# Patient Record
Sex: Male | Born: 1945
Health system: Southern US, Community
[De-identification: ages and names within clinical notes are randomized; demographics above are authoritative.]

## PROBLEM LIST (undated history)

## (undated) DIAGNOSIS — I10 Essential (primary) hypertension: Secondary | ICD-10-CM

## (undated) DIAGNOSIS — K219 Gastro-esophageal reflux disease without esophagitis: Secondary | ICD-10-CM

## (undated) DIAGNOSIS — J309 Allergic rhinitis, unspecified: Secondary | ICD-10-CM

## (undated) DIAGNOSIS — J45909 Unspecified asthma, uncomplicated: Secondary | ICD-10-CM

## (undated) DIAGNOSIS — N400 Enlarged prostate without lower urinary tract symptoms: Secondary | ICD-10-CM

## (undated) DIAGNOSIS — F329 Major depressive disorder, single episode, unspecified: Secondary | ICD-10-CM

## (undated) DIAGNOSIS — F32A Depression, unspecified: Secondary | ICD-10-CM

## (undated) DIAGNOSIS — G43909 Migraine, unspecified, not intractable, without status migrainosus: Secondary | ICD-10-CM

## (undated) DIAGNOSIS — E785 Hyperlipidemia, unspecified: Secondary | ICD-10-CM

## (undated) DIAGNOSIS — T7840XA Allergy, unspecified, initial encounter: Secondary | ICD-10-CM

## (undated) HISTORY — PX: KNEE ARTHROSCOPY: SUR90

## (undated) HISTORY — DX: Major depressive disorder, single episode, unspecified: F32.9

## (undated) HISTORY — DX: Hyperlipidemia, unspecified: E78.5

## (undated) HISTORY — DX: Allergic rhinitis, unspecified: J30.9

## (undated) HISTORY — DX: Depression, unspecified: F32.A

## (undated) HISTORY — DX: Essential (primary) hypertension: I10

## (undated) HISTORY — DX: Allergy, unspecified, initial encounter: T78.40XA

## (undated) HISTORY — DX: Migraine, unspecified, not intractable, without status migrainosus: G43.909

## (undated) HISTORY — DX: Benign prostatic hyperplasia without lower urinary tract symptoms: N40.0

## (undated) HISTORY — DX: Gastro-esophageal reflux disease without esophagitis: K21.9

---

## 2004-08-15 ENCOUNTER — Ambulatory Visit: Payer: Self-pay | Admitting: Internal Medicine

## 2004-08-21 ENCOUNTER — Ambulatory Visit (HOSPITAL_COMMUNITY): Admission: RE | Admit: 2004-08-21 | Discharge: 2004-08-21 | Payer: Self-pay | Admitting: Family Medicine

## 2004-08-23 ENCOUNTER — Ambulatory Visit (HOSPITAL_COMMUNITY): Admission: RE | Admit: 2004-08-23 | Discharge: 2004-08-23 | Payer: Self-pay | Admitting: Internal Medicine

## 2004-08-23 ENCOUNTER — Ambulatory Visit: Payer: Self-pay | Admitting: Internal Medicine

## 2012-05-07 ENCOUNTER — Telehealth: Payer: Self-pay | Admitting: Family Medicine

## 2012-05-07 NOTE — Telephone Encounter (Signed)
Told to go to urgent care- if needed urgently- or if really sob.- lm on daughters vm- she did not answer.

## 2012-05-08 ENCOUNTER — Ambulatory Visit (INDEPENDENT_AMBULATORY_CARE_PROVIDER_SITE_OTHER): Payer: Medicare Other

## 2012-05-08 ENCOUNTER — Encounter: Payer: Self-pay | Admitting: Family Medicine

## 2012-05-08 ENCOUNTER — Ambulatory Visit (INDEPENDENT_AMBULATORY_CARE_PROVIDER_SITE_OTHER): Payer: Medicare Other | Admitting: Family Medicine

## 2012-05-08 VITALS — BP 152/96 | HR 95 | Temp 97.2°F | Ht 67.5 in | Wt 166.0 lb

## 2012-05-08 DIAGNOSIS — K219 Gastro-esophageal reflux disease without esophagitis: Secondary | ICD-10-CM

## 2012-05-08 DIAGNOSIS — R0989 Other specified symptoms and signs involving the circulatory and respiratory systems: Secondary | ICD-10-CM

## 2012-05-08 DIAGNOSIS — F3289 Other specified depressive episodes: Secondary | ICD-10-CM

## 2012-05-08 DIAGNOSIS — R059 Cough, unspecified: Secondary | ICD-10-CM

## 2012-05-08 DIAGNOSIS — F32A Depression, unspecified: Secondary | ICD-10-CM | POA: Insufficient documentation

## 2012-05-08 DIAGNOSIS — R05 Cough: Secondary | ICD-10-CM

## 2012-05-08 DIAGNOSIS — R0602 Shortness of breath: Secondary | ICD-10-CM

## 2012-05-08 DIAGNOSIS — J45909 Unspecified asthma, uncomplicated: Secondary | ICD-10-CM

## 2012-05-08 DIAGNOSIS — F329 Major depressive disorder, single episode, unspecified: Secondary | ICD-10-CM

## 2012-05-08 LAB — POCT CBC
Granulocyte percent: 69.1 %G (ref 37–80)
Hemoglobin: 17 g/dL (ref 14.1–18.1)
Lymph, poc: 3 (ref 0.6–3.4)
MCHC: 34.5 g/dL (ref 31.8–35.4)
MPV: 8.2 fL (ref 0–99.8)
POC Granulocyte: 8.4 — AB (ref 2–6.9)

## 2012-05-08 MED ORDER — BUDESONIDE 0.5 MG/2ML IN SUSP: 0.2500 mg | Freq: Once | RESPIRATORY_TRACT | Status: DC

## 2012-05-08 MED ORDER — ALBUTEROL SULFATE (5 MG/ML) 0.5% IN NEBU: 2.5000 mg | INHALATION_SOLUTION | Freq: Four times a day (QID) | RESPIRATORY_TRACT | Status: DC | PRN

## 2012-05-08 NOTE — Progress Notes (Addendum)
Subjective:    Patient ID: Blake Madden, male    DOB: Jul 16, 1945, 67 y.o.   MRN: [id number]  HPI Mr. Bostrom presents today with cough and chest congestion.   Review of Systems  Constitutional: Positive for chills, diaphoresis and activity change (decreased due to SOB). Negative for fever.  HENT: Positive for congestion and sneezing.   Respiratory: Positive for cough (prod. yellow), chest tightness, shortness of breath and wheezing.        Several episodes of breathing shortness of breath and wheezing. The first episode was in November, he got some better from this. He has had continuous trouble since the end of January 2014. Sputum is between white and yellow in color. The only inhaler that he is using  Is proair  Cardiovascular: Negative for chest pain.  Gastrointestinal: Negative.   Genitourinary: Negative.   Musculoskeletal: Negative.   Neurological: Positive for headaches. Negative for dizziness and light-headedness.       Objective:   Physical Exam  Nursing note and vitals reviewed. Constitutional: He appears well-developed and well-nourished. He appears distressed.  HENT:  Head: Normocephalic and atraumatic.  Right Ear: External ear normal.  Left Ear: External ear normal.  Mouth/Throat: Oropharynx is clear and moist.  Slight nasal congestion bilateral  Eyes: Conjunctivae are normal. Right eye exhibits discharge. Left eye exhibits no discharge.  Neck: Normal range of motion. Neck supple. Thyromegaly present.  Cardiovascular: Normal rate, regular rhythm and normal heart sounds.  Exam reveals no gallop and no friction rub.   No murmur heard. Pulmonary/Chest: He is in respiratory distress. He has wheezes. He has no rales. He exhibits no tenderness.  Abdominal: Soft. He exhibits no mass. There is no tenderness. There is no rebound and no guarding.  Does have an umbilical hernia  Lymphadenopathy:    He has no cervical adenopathy.   [facility] reading (PRIMARY) by  Dr. Vernon Prey:  Prominent bronchial markings bilaterally                               Results for orders placed in visit on 05/08/12  POCT CBC      Result Value Range   WBC 12.1 (*) 4.6 - 10.2 K/uL   Lymph, poc 3.0  0.6 - 3.4   POC LYMPH PERCENT 24.5  10 - 50 %L   MID (cbc)    0 - 0.9   POC MID %    0 - 12 %M   POC Granulocyte 8.4 (*) 2 - 6.9   Granulocyte percent 69.1  37 - 80 %G   RBC 5.4  4.69 - 6.13 M/uL   Hemoglobin 17.0  14.1 - 18.1 g/dL   HCT, POC 14.7  82.9 - 53.7 %   MCV 91.0  80 - 97 fL   MCH, POC 31.4 (*) 27 - 31.2 pg   MCHC 34.5  31.8 - 35.4 g/dL   RDW, POC 56.2     Platelet Count, POC 359.0  142 - 424 K/uL   MPV 8.2  0 - 99.8 fL           Assessment & Plan:  1. Cough - POCT CBC - DG Chest 2 View; Future - budesonide (PULMICORT) nebulizer solution 0.25 mg; Take 1 mL (0.25 mg total) by nebulization once. - albuterol (PROVENTIL) (2.5 MG/3ML) 0.083% nebulizer solution 2.5 mg; Take 3 mLs (2.5 mg total) by nebulization now.  2. Chest congestion - POCT CBC -  DG Chest 2 View; Future - budesonide (PULMICORT) nebulizer solution 0.25 mg; Take 1 mL (0.25 mg total) by nebulization once. - albuterol (PROVENTIL) (2.5 MG/3ML) 0.083% nebulizer solution 2.5 mg; Take 3 mLs (2.5 mg total) by nebulization now.  3. SOB (shortness of breath) - POCT CBC - DG Chest 2 View; Future - budesonide (PULMICORT) nebulizer solution 0.25 mg; Take 1 mL (0.25 mg total) by nebulization once. - albuterol (PROVENTIL) (2.5 MG/3ML) 0.083% nebulizer solution 2.5 mg; Take 3 mLs (2.5 mg total) by nebulization now.  4. GERD (gastroesophageal reflux disease)  5. Depression  6. Asthmatic bronchitis - budesonide (PULMICORT) nebulizer solution 0.25 mg; Take 1 mL (0.25 mg total) by nebulization once. - For home use only DME Nebulizer machine - albuterol (PROVENTIL) (2.5 MG/3ML) 0.083% nebulizer solution 2.5 mg; Take 3 mLs (2.5 mg total) by nebulization now.

## 2012-05-08 NOTE — Patient Instructions (Addendum)
Pt will take meds as RX'd along with Mucinex Max twice daily with a large glass of water Will return to clinic in 1 week, if no better will refer to pulmonary

## 2012-05-11 MED ORDER — ALBUTEROL SULFATE (2.5 MG/3ML) 0.083% IN NEBU: 2.5000 mg | INHALATION_SOLUTION | RESPIRATORY_TRACT | Status: AC

## 2012-05-11 NOTE — Telephone Encounter (Signed)
Still no response from patient. May call back for an appt if still needed

## 2012-05-11 NOTE — Addendum Note (Signed)
Addended by: Ardine Eng A on: 05/11/2012 05:27 PM   Modules accepted: Orders

## 2012-05-13 ENCOUNTER — Ambulatory Visit (INDEPENDENT_AMBULATORY_CARE_PROVIDER_SITE_OTHER): Payer: Medicare Other | Admitting: Family Medicine

## 2012-05-13 ENCOUNTER — Encounter: Payer: Self-pay | Admitting: Family Medicine

## 2012-05-13 VITALS — BP 122/81 | HR 92 | Temp 98.3°F | Ht 67.5 in | Wt 168.2 lb

## 2012-05-13 DIAGNOSIS — J9801 Acute bronchospasm: Secondary | ICD-10-CM

## 2012-05-13 DIAGNOSIS — J209 Acute bronchitis, unspecified: Secondary | ICD-10-CM

## 2012-05-13 NOTE — Progress Notes (Signed)
  Subjective:    Patient ID: Blake Madden, male    DOB: 04/09/1945, 67 y.o.   MRN: [id number]  HPI Breathing better. Still some congestion and coughing. Sputum has a yellowish tint. No fever.   Review of Systems  HENT: Positive for congestion.   Respiratory: Positive for cough (resolving), shortness of breath and wheezing.        Objective:   Physical Exam  Vitals reviewed. Constitutional: He is oriented to person, place, and time. He appears well-developed and well-nourished. No distress.  HENT:  Head: Normocephalic and atraumatic.  Right Ear: External ear normal.  Left Ear: External ear normal.  Nose: Nose normal.  Mouth/Throat: Oropharynx is clear and moist. No oropharyngeal exudate.  Eyes: Conjunctivae are normal.  Neck: Normal range of motion. Neck supple. No thyromegaly present.  Cardiovascular: Normal rate, regular rhythm and normal heart sounds.   No murmur heard. Pulmonary/Chest: Effort normal. No respiratory distress. He has wheezes (wheezes with cough, sputum is still yellow tinged). He has no rales.  Musculoskeletal: Normal range of motion.  Lymphadenopathy:    He has no cervical adenopathy.  Neurological: He is alert and oriented to person, place, and time.  Skin: Skin is warm and dry.  Psychiatric: He has a normal mood and affect. His behavior is normal.          Assessment & Plan:  Acute bronchitis  Bronchospasm  Patient Instructions  Continue neb treatments for maybe 3 more weeks Return to clinic in 2weeks Continue fluid intake Continue Mucinex Finish prednisone and Z-Pak Avoid irritant environment

## 2012-05-13 NOTE — Patient Instructions (Signed)
Continue neb treatments for maybe 3 more weeks Return to clinic in 2weeks Continue fluid intake Continue Mucinex Finish prednisone and Z-Pak Avoid irritant environment

## 2012-05-19 MED ORDER — BUDESONIDE 0.25 MG/2ML IN SUSP
0.2500 mg | Freq: Once | RESPIRATORY_TRACT | Status: AC
  Administered 2012-05-08: 0.25 mg via RESPIRATORY_TRACT

## 2012-05-19 MED ORDER — METHYLPREDNISOLONE ACETATE 80 MG/ML IJ SUSP
60.0000 mg | Freq: Once | INTRAMUSCULAR | Status: AC
  Administered 2012-05-19: 60 mg via INTRAMUSCULAR

## 2012-05-19 MED ORDER — ALBUTEROL SULFATE (2.5 MG/3ML) 0.083% IN NEBU
2.5000 mg | INHALATION_SOLUTION | Freq: Once | RESPIRATORY_TRACT | Status: AC
  Administered 2012-05-08: 2.5 mg via RESPIRATORY_TRACT

## 2012-05-19 NOTE — Addendum Note (Signed)
Addended by: Ardine Eng A on: 05/19/2012 12:29 PM   Modules accepted: Orders

## 2012-05-27 ENCOUNTER — Encounter: Payer: Self-pay | Admitting: Family Medicine

## 2012-05-27 ENCOUNTER — Ambulatory Visit (INDEPENDENT_AMBULATORY_CARE_PROVIDER_SITE_OTHER): Payer: Medicare Other | Admitting: Family Medicine

## 2012-05-27 VITALS — BP 105/69 | HR 105 | Temp 98.0°F | Ht 67.5 in | Wt 171.2 lb

## 2012-05-27 DIAGNOSIS — R05 Cough: Secondary | ICD-10-CM

## 2012-05-27 DIAGNOSIS — J209 Acute bronchitis, unspecified: Secondary | ICD-10-CM

## 2012-05-27 DIAGNOSIS — J9801 Acute bronchospasm: Secondary | ICD-10-CM

## 2012-05-27 DIAGNOSIS — R059 Cough, unspecified: Secondary | ICD-10-CM

## 2012-05-27 NOTE — Patient Instructions (Addendum)
Continue Mucinex twice daily with a large glass of water for 3-4 more week Gradually decrease nebulizer treatments, for the next week do two  treatments daily, following week do one treatment daily at bedtime, then use nebulizer as needed. Continue albuterol inhaler as needed If worse return to clinic sooner Use respiratory protection if working in the yard and do not drive the car with the window down

## 2012-05-27 NOTE — Progress Notes (Signed)
  Subjective:    Patient ID: Blake Madden, male    DOB: May 26, 1945, 67 y.o.   MRN: [id number]  HPI Patient is doing much better, he is still doing 3 nebulizer treatments a day. He has finished the prednisone and the antibiotic. He has minimal cough and has been running no fever.  Review of Systems  HENT: Negative for congestion.   Respiratory: Positive for shortness of breath (with exertion but improving). Negative for cough and wheezing.   Neurological: Positive for headaches (constant). Negative for dizziness.       Objective:   Physical Exam  Vitals reviewed. Constitutional: He appears well-developed and well-nourished.  HENT:  Head: Normocephalic and atraumatic.  Right Ear: External ear normal.  Left Ear: External ear normal.  Mouth/Throat: Oropharynx is clear and moist. No oropharyngeal exudate.  Eyes: Conjunctivae are normal.  Neck: Normal range of motion. Neck supple.  Cardiovascular: Normal rate, regular rhythm and normal heart sounds.   96 per minute  Pulmonary/Chest: Effort normal and breath sounds normal. No respiratory distress. He has no wheezes. He has no rales.  Musculoskeletal: Normal range of motion.  Lymphadenopathy:    He has no cervical adenopathy.  Skin: Skin is warm and dry.  Psychiatric: He has a normal mood and affect. His behavior is normal. Judgment and thought content normal.          Assessment & Plan:  Acute bronchitis  Bronchospasm  Cough  All complaints are improved  Patient Instructions  Continue Mucinex twice daily with a large glass of water for 3-4 more week Gradually decrease nebulizer treatments, for the next week due to treatments daily, following week due one treatment daily at bedtime, then use nebulizer as needed. Continue albuterol inhaler as needed If worse return to clinic sooner Use respiratory protection if working in the yard and do not drive the car with the window down   Patient Instructions  Continue Mucinex  twice daily with a large glass of water for 3-4 more week Gradually decrease nebulizer treatments, for the next week do two  treatments daily, following week do one treatment daily at bedtime, then use nebulizer as needed. Continue albuterol inhaler as needed If worse return to clinic sooner Use respiratory protection if working in the yard and do not drive the car with the window down

## 2012-07-16 ENCOUNTER — Ambulatory Visit (INDEPENDENT_AMBULATORY_CARE_PROVIDER_SITE_OTHER): Payer: Medicare Other | Admitting: Family Medicine

## 2012-07-16 ENCOUNTER — Telehealth: Payer: Self-pay | Admitting: Family Medicine

## 2012-07-16 ENCOUNTER — Encounter: Payer: Self-pay | Admitting: Family Medicine

## 2012-07-16 VITALS — BP 135/80 | HR 103 | Temp 98.5°F | Ht 66.5 in | Wt 171.6 lb

## 2012-07-16 DIAGNOSIS — J45901 Unspecified asthma with (acute) exacerbation: Secondary | ICD-10-CM

## 2012-07-16 DIAGNOSIS — J209 Acute bronchitis, unspecified: Secondary | ICD-10-CM

## 2012-07-16 DIAGNOSIS — J441 Chronic obstructive pulmonary disease with (acute) exacerbation: Secondary | ICD-10-CM

## 2012-07-16 MED ORDER — IPRATROPIUM-ALBUTEROL 0.5-2.5 (3) MG/3ML IN SOLN
3.0000 mL | Freq: Once | RESPIRATORY_TRACT | Status: AC
  Administered 2012-07-16: 3 mL via RESPIRATORY_TRACT

## 2012-07-16 MED ORDER — METHYLPREDNISOLONE SODIUM SUCC 125 MG IJ SOLR
125.0000 mg | Freq: Once | INTRAMUSCULAR | Status: AC
  Administered 2012-07-16: 125 mg via INTRAMUSCULAR

## 2012-07-16 MED ORDER — LEVOFLOXACIN 500 MG PO TABS: 500.0000 mg | ORAL_TABLET | Freq: Every day | ORAL | Status: DC

## 2012-07-16 MED ORDER — ALBUTEROL SULFATE (2.5 MG/3ML) 0.083% IN NEBU: 2.5000 mg | INHALATION_SOLUTION | Freq: Four times a day (QID) | RESPIRATORY_TRACT | Status: DC | PRN

## 2012-07-16 MED ORDER — METHYLPREDNISOLONE SODIUM SUCC 125 MG IJ SOLR: 125.0000 mg | Freq: Once | INTRAMUSCULAR | Status: DC

## 2012-07-16 MED ORDER — ALBUTEROL SULFATE HFA 108 (90 BASE) MCG/ACT IN AERS: 2.0000 | INHALATION_SPRAY | Freq: Four times a day (QID) | RESPIRATORY_TRACT | Status: DC | PRN

## 2012-07-16 MED ORDER — PREDNISONE 10 MG PO TABS: ORAL_TABLET | ORAL | Status: DC

## 2012-07-16 NOTE — Patient Instructions (Addendum)

## 2012-07-16 NOTE — Telephone Encounter (Signed)
appt made

## 2012-07-17 DIAGNOSIS — J45901 Unspecified asthma with (acute) exacerbation: Secondary | ICD-10-CM | POA: Insufficient documentation

## 2012-07-17 DIAGNOSIS — J441 Chronic obstructive pulmonary disease with (acute) exacerbation: Secondary | ICD-10-CM | POA: Insufficient documentation

## 2012-07-17 NOTE — Progress Notes (Signed)
  Subjective:    Patient ID: Blake Madden, male    DOB: 06-14-1945, 67 y.o.   MRN: [id number]  HPI This 67 y.o. male presents for evaluation of cough and SOB He has been having mucopurulent productive cough for over a week now and is noticing he is SOB and dyspniec with any exertion or activity.  He states he is as bad as he was when he saw Dr. Christell Constant last back in January.  He was tx'd with abx's and prednisone taper.  He has been using his nebulizer but he ran out of medicine.  He was at work today and he developed some severe wheezing and had difficulty getting his breath and his wife told him to go to the doctor.  He is having difficulty talking and has to take deep breaths between sentences.  His oxygen saturation is 93% on room air.  Review of Systems No chest pain, HA, dizziness, vision change, N/V, diarrhea, constipation, dysuria, urinary urgency or frequency, myalgias, arthralgias or rash.  C/o SOB and wheezing.     Objective:   Physical Exam  Vital signs noted Acutely ill appearing male with mild respiratory distress.  HEENT - Head atraumatic Normocephalic                Eyes - PERRLA, Conjuctiva - clear Sclera- Clear EOMI                Ears - EAC's Wnl TM's Wnl Gross Hearing WNL                Nose - Nares patent                 Throat - oropharanx wnl Respiratory - Lungs diminished with wheezes scattered throughout all lung fields.  O2 sat is 93% on room air.   Cardiac - RRR S1 and S2 without murmur GI - Abdomen soft Nontender and bowel sounds active x 4 Extremities - No edema. Neuro - Grossly intact.      Assessment & Plan:  Acute bronchitis - Plan: methylPREDNISolone sodium succinate (SOLU-MEDROL) 125 mg/2 mL injection 125 mg, ipratropium-albuterol (DUONEB) 0.5-2.5 (3) MG/3ML nebulizer solution 3 mL, DISCONTINUED: methylPREDNISolone sodium succinate (SOLU-MEDROL) 125 mg/2 mL injection Patient breath sounds much improved and clear after neb tx and breathing better after  neb tollow up in one week.  Mucinex otc advised, advised if he gets tight or has anymore distress to go to ED.  Need to follow up in one week.

## 2012-07-23 ENCOUNTER — Ambulatory Visit (INDEPENDENT_AMBULATORY_CARE_PROVIDER_SITE_OTHER): Payer: Medicare Other | Admitting: Family Medicine

## 2012-07-23 ENCOUNTER — Encounter: Payer: Self-pay | Admitting: Family Medicine

## 2012-07-23 ENCOUNTER — Ambulatory Visit (INDEPENDENT_AMBULATORY_CARE_PROVIDER_SITE_OTHER): Payer: Medicare Other

## 2012-07-23 VITALS — BP 124/86 | HR 93 | Temp 97.5°F | Wt 169.8 lb

## 2012-07-23 DIAGNOSIS — J45901 Unspecified asthma with (acute) exacerbation: Secondary | ICD-10-CM

## 2012-07-23 DIAGNOSIS — J441 Chronic obstructive pulmonary disease with (acute) exacerbation: Secondary | ICD-10-CM

## 2012-07-23 MED ORDER — BUDESONIDE 1 MG/2ML IN SUSP: 1.0000 mg | Freq: Every day | RESPIRATORY_TRACT | Status: DC

## 2012-07-23 MED ORDER — BUDESONIDE-FORMOTEROL FUMARATE 160-4.5 MCG/ACT IN AERO: 2.0000 | INHALATION_SPRAY | Freq: Two times a day (BID) | RESPIRATORY_TRACT | Status: DC

## 2012-07-23 MED ORDER — TRIAMCINOLONE ACETONIDE 40 MG/ML IJ SUSP
60.0000 mg | Freq: Once | INTRAMUSCULAR | Status: AC
  Administered 2012-07-23: 60 mg via INTRAMUSCULAR

## 2012-07-23 NOTE — Patient Instructions (Addendum)
Triamcinolone injection What is this medicine? TRIAMCINOLONE (trye am SIN oh lone) is a corticosteroid. It helps to reduce swelling, redness, itching, and allergic reactions. This medicine is used to treat allergies, arthritis, asthma, skin problems, and many other conditions. This medicine may be used for other purposes; ask your health care provider or pharmacist if you have questions. What should I tell my health care provider before I take this medicine? They need to know if you have any of these conditions: -depression, anxiety, or psychotic disturbances -diabetes -infection, like tuberculosis, herpes, or fungal infection -liver disease -osteoporosis -previous heart attack -seizures -stomach or intestine disease -thyroid disease -an unusual or allergic reaction to triamcinolone, corticosteroids, benzyl alcohol, other medicines, foods, dyes, or preservatives -pregnant or trying to get pregnant -breast-feeding How should I use this medicine? This medicine is injected by a health care professional. After your dose follow your doctor's instructions for your care. Contact your pediatrician regarding the use of this medicine in children. Special care may be needed. Overdosage: If you think you have taken too much of this medicine contact a poison control center or emergency room at once. NOTE: This medicine is only for you. Do not share this medicine with others. What if I miss a dose? This does not apply. What may interact with this medicine? Do not take this medicine with any of the following medications: -mifepristone This medicine may also interact with the following medications: -aspirin -other steroid medicines -vaccines and other immunization products This list may not describe all possible interactions. Give your health care provider a list of all the medicines, herbs, non-prescription drugs, or dietary supplements you use. Also tell them if you smoke, drink alcohol, or use  illegal drugs. Some items may interact with your medicine. What should I watch for while using this medicine? Visit your doctor or health care professional for regular checks on your progress. If you are diabetic, check your blood sugar as directed. If you are taking this medicine for a long time, carry an identification card with your name, the type and dose of medicine, and your doctor's name and address. You may need to be on a special diet while taking this medicine. Talk to your doctor. Do not come in contact with people who have chickenpox or the measles while you are taking this medicine. If you do, call your doctor right away. What side effects may I notice from receiving this medicine? Side effects that you should report to your doctor or health care professional as soon as possible: -allergic reactions like skin rash, itching or hives, swelling of the face, lips, or tongue -black, tarry stools -breathing problems -changes in vision -confusion, depression, excitement, mood swings -dizziness -fever, infection, sores that do not heal -frequent passing of urine -high blood pressure -increased thirst -lumpy, thin skin at site where injected -menstrual problems -pain in back, hips, shoulders, ribs -rounding of face -seizures -stomach pain -swelling of feet, hands -unusual bruising or red pinpoint spots on the skin -unusually weak or tired Side effects that usually do not require medical attention (report to your doctor or health care professional if they continue or are bothersome): -headache -increased sweating -trouble sleeping -unusual increased growth of hair on the face or body -upset stomach, nausea This list may not describe all possible side effects. Call your doctor for medical advice about side effects. You may report side effects to FDA at 1-800-FDA-1088. Where should I keep my medicine? Keep out of the reach of children. Store at  room temperature between 15 and 30  degrees C (59 and 86 degrees F). Do not freeze. Protect from light. Throw away any unused medicine after the expiration date. NOTE: This sheet is a summary. It may not cover all possible information. If you have questions about this medicine, talk to your doctor, pharmacist, or health care provider.  2013, [employer]. (03/27/2007 4:14:48 PM)

## 2012-07-23 NOTE — Progress Notes (Signed)
  Subjective:    Patient ID: Blake Madden, male    DOB: 27-Mar-1945, 67 y.o.   MRN: [id number]  HPI  This 67 y.o. male presents for evaluation of shortness of breath and exacerbation of his asthma.  He reported to clinic 7 days ago with bronchospasms and shortness of breath.  He was tx with abx's, prednisone taper for 12 days, and neb tx's.  He states he feels a lot better and has increased his activity to what he does normally and he even played golf yesterday.  He has been using the nebulizer 3xday now uses his rescue MDI on occasion when at work and yesterday when golfing.  He denies having any mucopurulent sputum when coughing.  His cxr in April shows normal cxr.   He reports that over the last year he has had 3 exacerbations like this and never had any problems with asthma in the past.  He has never smoked.  Review of Systems   C/o cough and wheezing. No chest pain, SOB, HA, dizziness, vision change, N/V, diarrhea, constipation, dysuria, urinary urgency or frequency, myalgias, arthralgias or rash.  Objective:   Physical Exam  Vital signs noted  Well developed well nourished male.  HEENT - Head atraumatic Normocephalic                Eyes - PERRLA, Conjuctiva - clear Sclera- Clear EOMI                Ears - EAC's Wnl TM's Wnl Gross Hearing WNL                Nose - Nares patent                 Throat - oropharanx wnl Respiratory - Lungs equal with good movement with scatttered exp wheezes throughout. Cardiac - RRR S1 and S2 without murmur. Neuro - Grossly intact.  Results for orders placed in visit on 05/08/12  POCT CBC      Result Value Range   WBC 12.1 (*) 4.6 - 10.2 K/uL   Lymph, poc 3.0  0.6 - 3.4   POC LYMPH PERCENT 24.5  10 - 50 %L   MID (cbc)    0 - 0.9   POC MID %    0 - 12 %M   POC Granulocyte 8.4 (*) 2 - 6.9   Granulocyte percent 69.1  37 - 80 %G   RBC 5.4  4.69 - 6.13 M/uL   Hemoglobin 17.0  14.1 - 18.1 g/dL   HCT, POC 21.3  08.6 - 53.7 %   MCV 91.0  80 - 97 fL    MCH, POC 31.4 (*) 27 - 31.2 pg   MCHC 34.5  31.8 - 35.4 g/dL   RDW, POC 57.8     Platelet Count, POC 359.0  142 - 424 K/uL   MPV 8.2  0 - 99.8 fL      Assessment & Plan:  Asthma, chronic obstructive, with acute exacerbation Rx Pulmicort 1mg /13ml with nebulizer bid start now and use for next 2 weeks. Symbicort 160/4.5 2 puffs bid and start in one week after weaning of oral prednisone. Continue Neb tx's 3x daily and continue Resuce albuterol MDI prn. CXR today Depomedrol 60mg  IM today. PFT today - Fev1 81%,  Consult Dr. Lon[facility] in [place]. Discussed with Dr. Christell Constant patient H &Pand tx and he concurs.

## 2012-07-23 NOTE — Progress Notes (Signed)
Tolerated injection well. 

## 2012-12-17 ENCOUNTER — Ambulatory Visit (INDEPENDENT_AMBULATORY_CARE_PROVIDER_SITE_OTHER): Payer: Medicare Other | Admitting: Family Medicine

## 2012-12-17 VITALS — BP 136/87 | HR 104 | Temp 99.1°F | Ht 66.5 in | Wt 175.8 lb

## 2012-12-17 DIAGNOSIS — J209 Acute bronchitis, unspecified: Secondary | ICD-10-CM

## 2012-12-17 DIAGNOSIS — J441 Chronic obstructive pulmonary disease with (acute) exacerbation: Secondary | ICD-10-CM

## 2012-12-17 MED ORDER — METHYLPREDNISOLONE SODIUM SUCC 125 MG IJ SOLR: 125.0000 mg | Freq: Once | INTRAMUSCULAR | Status: DC

## 2012-12-17 MED ORDER — PREDNISONE 10 MG PO TABS: ORAL_TABLET | ORAL | Status: DC

## 2012-12-17 MED ORDER — LEVOFLOXACIN 500 MG PO TABS: 500.0000 mg | ORAL_TABLET | Freq: Every day | ORAL | Status: DC

## 2012-12-17 MED ORDER — ROFLUMILAST 500 MCG PO TABS: 500.0000 ug | ORAL_TABLET | Freq: Every day | ORAL | Status: DC

## 2012-12-17 NOTE — Progress Notes (Signed)
  Subjective:    Patient ID: Blake Madden, male    DOB: 06/25/1945, 67 y.o.   MRN: [id number]  HPI This 67 y.o. male presents for evaluation of difficulty with breathing and bronchitis sx's. He states he has been using his nebulizer and this helps but he is having a lot of wheezing And SOB.  He has hx of Copd.   Review of Systems C/o wheezing and SOB. No chest pain, HA, dizziness, vision change, N/V, diarrhea, constipation, dysuria, urinary urgency or frequency, myalgias, arthralgias or rash.     Objective:   Physical Exam Vital signs noted  Well developed well nourished male.  HEENT - Head atraumatic Normocephalic                Eyes - PERRLA, Conjuctiva - clear Sclera- Clear EOMI                Ears - EAC's Wnl TM's Wnl Gross Hearing WNL                Throat - oropharanx wnl Respiratory - Lungs bilateral wheezing scattered Cardiac - RRR S1 and S2 without murmur GI - Abdomen soft Nontender and bowel sounds active x 4 Extremities - No edema. Neuro - Grossly intact.       Assessment & Plan:  Acute bronchitis - Plan: methylPREDNISolone sodium succinate (SOLU-MEDROL) 125 mg/2 mL injection, levofloxacin (LEVAQUIN) 500 MG tablet, predniSONE (DELTASONE) 10 MG tablet  COPD exacerbation - Plan: roflumilast (DALIRESP) 500 MCG TABS tablet  Continue neb treatments and follow up prn.  Deatra Canter FNP

## 2012-12-17 NOTE — Patient Instructions (Signed)

## 2013-02-22 ENCOUNTER — Telehealth: Payer: Self-pay | Admitting: Family Medicine

## 2013-02-22 NOTE — Telephone Encounter (Signed)
appt scheduled with Dietrich Pates for med refills

## 2013-02-23 ENCOUNTER — Ambulatory Visit: Payer: Medicare Other | Admitting: Family Medicine

## 2013-02-24 ENCOUNTER — Ambulatory Visit (INDEPENDENT_AMBULATORY_CARE_PROVIDER_SITE_OTHER): Payer: Medicare HMO | Admitting: Family Medicine

## 2013-02-24 ENCOUNTER — Encounter: Payer: Self-pay | Admitting: Family Medicine

## 2013-02-24 ENCOUNTER — Ambulatory Visit (INDEPENDENT_AMBULATORY_CARE_PROVIDER_SITE_OTHER): Payer: Medicare HMO

## 2013-02-24 VITALS — BP 140/94 | HR 120 | Temp 98.4°F | Ht 66.5 in | Wt 174.0 lb

## 2013-02-24 DIAGNOSIS — R0602 Shortness of breath: Secondary | ICD-10-CM

## 2013-02-24 DIAGNOSIS — F32A Depression, unspecified: Secondary | ICD-10-CM

## 2013-02-24 DIAGNOSIS — J45901 Unspecified asthma with (acute) exacerbation: Secondary | ICD-10-CM

## 2013-02-24 DIAGNOSIS — R000 Tachycardia, unspecified: Secondary | ICD-10-CM

## 2013-02-24 DIAGNOSIS — J441 Chronic obstructive pulmonary disease with (acute) exacerbation: Secondary | ICD-10-CM

## 2013-02-24 DIAGNOSIS — G4700 Insomnia, unspecified: Secondary | ICD-10-CM

## 2013-02-24 DIAGNOSIS — E785 Hyperlipidemia, unspecified: Secondary | ICD-10-CM

## 2013-02-24 DIAGNOSIS — F3289 Other specified depressive episodes: Secondary | ICD-10-CM

## 2013-02-24 DIAGNOSIS — F329 Major depressive disorder, single episode, unspecified: Secondary | ICD-10-CM

## 2013-02-24 DIAGNOSIS — K219 Gastro-esophageal reflux disease without esophagitis: Secondary | ICD-10-CM

## 2013-02-24 DIAGNOSIS — J209 Acute bronchitis, unspecified: Secondary | ICD-10-CM

## 2013-02-24 LAB — POCT CBC
Granulocyte percent: 63.9 %G (ref 37–80)
HCT, POC: 49.7 % (ref 43.5–53.7)
Hemoglobin: 16.3 g/dL (ref 14.1–18.1)
Lymph, poc: 4.8 — AB (ref 0.6–3.4)
MCH, POC: 29.5 pg (ref 27–31.2)
MCHC: 32.8 g/dL (ref 31.8–35.4)
MCV: 89.7 fL (ref 80–97)
MPV: 9.6 fL (ref 0–99.8)
POC Granulocyte: 9.7 — AB (ref 2–6.9)
POC LYMPH PERCENT: 31.7 %L (ref 10–50)
Platelet Count, POC: 344 10*3/uL (ref 142–424)
RBC: 5.5 M/uL (ref 4.69–6.13)
RDW, POC: 13.7 %
WBC: 15.2 10*3/uL — AB (ref 4.6–10.2)

## 2013-02-24 MED ORDER — LEVOFLOXACIN 500 MG PO TABS
500.0000 mg | ORAL_TABLET | Freq: Every day | ORAL | Status: DC
Start: 2013-02-24 — End: 2013-04-16

## 2013-02-24 MED ORDER — OMEPRAZOLE 20 MG PO CPDR: 20.0000 mg | DELAYED_RELEASE_CAPSULE | Freq: Every day | ORAL | Status: DC

## 2013-02-24 MED ORDER — PREDNISONE 10 MG PO TABS: ORAL_TABLET | ORAL | Status: DC

## 2013-02-24 MED ORDER — QUETIAPINE FUMARATE 200 MG PO TABS: 200.0000 mg | ORAL_TABLET | Freq: Every day | ORAL | Status: DC

## 2013-02-24 MED ORDER — ALBUTEROL SULFATE HFA 108 (90 BASE) MCG/ACT IN AERS: 2.0000 | INHALATION_SPRAY | Freq: Four times a day (QID) | RESPIRATORY_TRACT | Status: DC | PRN

## 2013-02-24 MED ORDER — METOPROLOL TARTRATE 50 MG PO TABS
ORAL_TABLET | ORAL | Status: DC
Start: 2013-02-24 — End: 2013-08-21

## 2013-02-24 MED ORDER — PRAVASTATIN SODIUM 40 MG PO TABS
40.0000 mg | ORAL_TABLET | Freq: Every day | ORAL | Status: DC
Start: 2013-02-24 — End: 2014-02-21

## 2013-02-24 MED ORDER — METHYLPREDNISOLONE SODIUM SUCC 125 MG IJ SOLR
125.0000 mg | Freq: Once | INTRAMUSCULAR | Status: AC
  Administered 2013-02-24: 125 mg via INTRAMUSCULAR

## 2013-02-24 MED ORDER — ZOLPIDEM TARTRATE 10 MG PO TABS: 10.0000 mg | ORAL_TABLET | Freq: Every evening | ORAL | Status: DC | PRN

## 2013-02-24 MED ORDER — ACLIDINIUM BROMIDE 400 MCG/ACT IN AEPB: 400.0000 ug | INHALATION_SPRAY | Freq: Two times a day (BID) | RESPIRATORY_TRACT | Status: DC

## 2013-02-24 MED ORDER — BUDESONIDE-FORMOTEROL FUMARATE 160-4.5 MCG/ACT IN AERO: 2.0000 | INHALATION_SPRAY | Freq: Two times a day (BID) | RESPIRATORY_TRACT | Status: DC

## 2013-02-24 MED ORDER — VENLAFAXINE HCL ER 150 MG PO CP24: 150.0000 mg | ORAL_CAPSULE | Freq: Every day | ORAL | Status: DC

## 2013-02-24 NOTE — Progress Notes (Signed)
   Subjective:    Patient ID: Blake Madden, male    DOB: 1945/07/01, 68 y.o.   MRN: [id number]  HPI This 68 y.o. male presents for evaluation of cough, SOB, tacycardia, DOE, and fatigue. He has hx of Copd and has been having exacerbation sx's.  He has seen pulmonary and Nothing has been added.  He is not on a anticholinergic for COPD maintenance inhaler. He uses his SABA frequently and has had to use his neb with pulmicort but is not better. He has been having a lot of anxiety and insomnia and need refills on his medications. He is switching over to our clinic from the [place] with his medications.   Review of Systems C/o fatigue and SOB No chest pain, HA, dizziness, vision change, N/V, diarrhea, constipation, dysuria, urinary urgency or frequency, myalgias, arthralgias or rash.     Objective:   Physical Exam  Vital signs noted  Well developed well nourished male.  HEENT - Head atraumatic Normocephalic                Eyes - PERRLA, Conjuctiva - clear Sclera- Clear EOMI                Ears - EAC's Wnl TM's Wnl Gross Hearing WNL                Nose - Nares patent                 Throat - oropharanx wnl Respiratory - Lungs diminshed bilateral with scattered exp wheezes Cardiac - RRR S1 and S2 without murmur GI - Abdomen soft Nontender and bowel sounds active x 4 Extremities - No edema. Neuro - Grossly intact.      Assessment & Plan:  Tachycardia - Plan: EKG 12-Lead, DG Chest 2 View, POCT CBC, CMP14+EGFR, Thyroid Panel With TSH, Brain natriuretic peptide, metoprolol (LOPRESSOR) 50 MG tablet  Shortness of breath - Plan: predniSONE (DELTASONE) 10 MG tablet, levofloxacin (LEVAQUIN) 500 MG tablet, Aclidinium Bromide (TUDORZA PRESSAIR) 400 MCG/ACT AEPB, budesonide-formoterol (SYMBICORT) 160-4.5 MCG/ACT inhaler  Acute bronchitis - Plan: predniSONE (DELTASONE) 10 MG tablet, levofloxacin (LEVAQUIN) 500 MG tablet, Aclidinium Bromide (TUDORZA PRESSAIR) 400 MCG/ACT AEPB, methylPREDNISolone  sodium succinate (SOLU-MEDROL) 125 mg/2 mL injection 125 mg  Asthma, chronic obstructive, with acute exacerbation - Plan: albuterol (PROAIR HFA) 108 (90 BASE) MCG/ACT inhaler, predniSONE (DELTASONE) 10 MG tablet, levofloxacin (LEVAQUIN) 500 MG tablet, Aclidinium Bromide (TUDORZA PRESSAIR) 400 MCG/ACT AEPB, budesonide-formoterol (SYMBICORT) 160-4.5 MCG/ACT inhaler  COPD exacerbation - Plan: albuterol (PROAIR HFA) 108 (90 BASE) MCG/ACT inhaler, predniSONE (DELTASONE) 10 MG tablet, levofloxacin (LEVAQUIN) 500 MG tablet, Aclidinium Bromide (TUDORZA PRESSAIR) 400 MCG/ACT AEPB, budesonide-formoterol (SYMBICORT) 160-4.5 MCG/ACT inhaler  Insomnia - Plan: QUEtiapine (SEROQUEL) 200 MG tablet, zolpidem (AMBIEN) 10 MG tablet  GERD (gastroesophageal reflux disease) - Plan: omeprazole (PRILOSEC) 20 MG capsule  Depression - Plan: venlafaxine XR (EFFEXOR-XR) 150 MG 24 hr capsule  Other and unspecified hyperlipidemia - Plan: pravastatin (PRAVACHOL) 40 MG tablet  Follow up in 2 weeks or prn if not better.  Lysbeth Penner FNP

## 2013-02-24 NOTE — Patient Instructions (Signed)
Chronic Obstructive Pulmonary Disease  Chronic obstructive pulmonary disease (COPD) is a common lung condition in which airflow from the lungs is limited. COPD is a general term that can be used to describe many different lung problems that limit airflow, including both chronic bronchitis and emphysema.  If you have COPD, your lung function will probably never return to normal, but there are measures you can take to improve lung function and make yourself feel better.   CAUSES   · Smoking (common).    · Exposure to secondhand smoke.    · Genetic problems.  · Chronic inflammatory lung diseases or recurrent infections.  SYMPTOMS   · Shortness of breath, especially with physical activity.    · Deep, persistent (chronic) cough with a large amount of thick mucus.    · Wheezing.    · Rapid breaths (tachypnea).    · Gray or bluish discoloration (cyanosis) of the skin, especially in fingers, toes, or lips.    · Fatigue.    · Weight loss.    · Frequent infections or episodes when breathing symptoms become much worse (exacerbations).    · Chest tightness.  DIAGNOSIS   Your healthcare provider will take a medical history and perform a physical examination to make the initial diagnosis.  Additional tests for COPD may include:   · Lung (pulmonary) function tests.  · Chest X-ray.  · CT scan.  · Blood tests.  TREATMENT   Treatment available to help you feel better when you have COPD include:   · Inhaler and nebulizer medicines. These help manage the symptoms of COPD and make your breathing more comfortable  · Supplemental oxygen. Supplemental oxygen is only helpful if you have a low oxygen level in your blood.    · Exercise and physical activity. These are beneficial for nearly all people with COPD. Some people may also benefit from a pulmonary rehabilitation program.  HOME CARE INSTRUCTIONS   · Take all medicines (inhaled or pills) as directed by your health care provider.  · Only take over-the-counter or prescription medicines  for pain, fever, or discomfort as directed by your health care provider.    · Avoid over-the-counter medicines or cough syrups that dry up your airway (such as antihistamines) and slow down the elimination of secretions unless instructed otherwise by your healthcare provider.    · If you are a smoker, the most important thing that you can do is stop smoking. Continuing to smoke will cause further lung damage and breathing trouble. Ask your health care provider for help with quitting smoking. He or she can direct you to community resources or hospitals that provide support.  · Avoid exposure to irritants such as smoke, chemicals, and fumes that aggravate your breathing.  · Use oxygen therapy and pulmonary rehabilitation if directed by your health care provider. If you require home oxygen therapy, ask your healthcare provider whether you should purchase a pulse oximeter to measure your oxygen level at home.    · Avoid contact with individuals who have a contagious illness.  · Avoid extreme temperature and humidity changes.  · Eat healthy foods. Eating smaller, more frequent meals and resting before meals may help you maintain your strength.  · Stay active, but balance activity with periods of rest. Exercise and physical activity will help you maintain your ability to do things you want to do.  · Preventing infection and hospitalization is very important when you have COPD. Make sure to receive all the vaccines your health care provider recommends, especially the pneumococcal and influenza vaccines. Ask your healthcare provider whether you   need a pneumonia vaccine.  · Learn and use relaxation techniques to manage stress.  · Learn and use controlled breathing techniques as directed by your health care provider. Controlled breathing techniques include:    · Pursed lip breathing. Start by breathing in (inhaling) through your nose for 1 second. Then, purse your lips as if you were going to whistle and breathe out (exhale)  through the pursed lips for 2 seconds.    · Diaphragmatic breathing. Start by putting one hand on your abdomen just above your waist. Inhale slowly through your nose. The hand on your abdomen should move out. Then purse your lips and exhale slowly. You should be able to feel the hand on your abdomen moving in as you exhale.    · Learn and use controlled coughing to clear mucus from your lungs. Controlled coughing is a series of short, progressive coughs. The steps of controlled coughing are:    1. Lean your head slightly forward.    2. Breathe in deeply using diaphragmatic breathing.    3. Try to hold your breath for 3 seconds.    4. Keep your mouth slightly open while coughing twice.    5. Spit any mucus out into a tissue.    6. Rest and repeat the steps once or twice as needed.  SEEK MEDICAL CARE IF:   · You are coughing up more mucus than usual.    · There is a change in the color or thickness of your mucus.    · Your breathing is more labored than usual.    · Your breathing is faster than usual.    SEEK IMMEDIATE MEDICAL CARE IF:   · You have shortness of breath while you are resting.    · You have shortness of breath that prevents you from:  · Being able to talk.    · Performing your usual physical activities.    · You have chest pain lasting longer than 5 minutes.    · Your skin color is more cyanotic than usual.  · You measure low oxygen saturations for longer than 5 minutes with a pulse oximeter.  MAKE SURE YOU:   · Understand these instructions.  · Will watch your condition.  · Will get help right away if you are not doing well or get worse.  Document Released: 10/24/2004 Document Revised: 11/04/2012 Document Reviewed: 09/10/2012  [employer]® Patient Information ©2014 [employer].

## 2013-02-25 LAB — CMP14+EGFR
ALT: 22 IU/L (ref 0–44)
AST: 16 IU/L (ref 0–40)
Albumin/Globulin Ratio: 2.1 (ref 1.1–2.5)
Albumin: 4.5 g/dL (ref 3.6–4.8)
Alkaline Phosphatase: 72 IU/L (ref 39–117)
BUN/Creatinine Ratio: 24 — ABNORMAL HIGH (ref 10–22)
BUN: 21 mg/dL (ref 8–27)
CO2: 24 mmol/L (ref 18–29)
Calcium: 10.4 mg/dL — ABNORMAL HIGH (ref 8.6–10.2)
Chloride: 100 mmol/L (ref 97–108)
Creatinine, Ser: 0.88 mg/dL (ref 0.76–1.27)
GFR calc Af Amer: 102 mL/min/{1.73_m2} (ref >59)
GFR calc non Af Amer: 88 mL/min/{1.73_m2} (ref >59)
Globulin, Total: 2.1 g/dL (ref 1.5–4.5)
Glucose: 99 mg/dL (ref 65–99)
Potassium: 3.9 mmol/L (ref 3.5–5.2)
Sodium: 143 mmol/L (ref 134–144)
Total Bilirubin: 0.5 mg/dL (ref 0.0–1.2)
Total Protein: 6.6 g/dL (ref 6.0–8.5)

## 2013-02-25 LAB — THYROID PANEL WITH TSH
Free Thyroxine Index: 2.2 (ref 1.2–4.9)
T3 Uptake Ratio: 29 % (ref 24–39)
T4, Total: 7.5 ug/dL (ref 4.5–12.0)
TSH: 2.52 u[IU]/mL (ref 0.450–4.500)

## 2013-02-25 LAB — BRAIN NATRIURETIC PEPTIDE: BNP: 8.9 pg/mL (ref 0.0–100.0)

## 2013-02-26 ENCOUNTER — Telehealth: Payer: Self-pay | Admitting: Family Medicine

## 2013-02-26 NOTE — Telephone Encounter (Signed)
Message copied by Waverly Ferrari on Fri Feb 26, 2013  9:49 AM ------      Message from: Lysbeth Penner      Created: Fri Feb 26, 2013  8:26 AM       Wbc's elevated due to URI and labs look good otherwise ------

## 2013-03-18 NOTE — Telephone Encounter (Signed)
PAtient aware 

## 2013-04-15 ENCOUNTER — Telehealth: Payer: Self-pay | Admitting: Family Medicine

## 2013-04-15 NOTE — Telephone Encounter (Signed)
appt given for Friday at 2:45 with bill

## 2013-04-16 ENCOUNTER — Ambulatory Visit (INDEPENDENT_AMBULATORY_CARE_PROVIDER_SITE_OTHER): Payer: Medicare HMO | Admitting: Family Medicine

## 2013-04-16 VITALS — BP 113/71 | HR 82 | Temp 98.5°F | Ht 66.5 in | Wt 179.8 lb

## 2013-04-16 DIAGNOSIS — J441 Chronic obstructive pulmonary disease with (acute) exacerbation: Secondary | ICD-10-CM

## 2013-04-16 MED ORDER — PREDNISONE 10 MG PO TABS: ORAL_TABLET | ORAL | Status: DC

## 2013-04-16 MED ORDER — AMOXICILLIN 875 MG PO TABS: 875.0000 mg | ORAL_TABLET | Freq: Two times a day (BID) | ORAL | Status: DC

## 2013-04-16 NOTE — Progress Notes (Signed)
   Subjective:    Patient ID: AHMAD VANWEY, male    DOB: [birth year], 68 y.o.   MRN: [id number]  HPI This 68 y.o. male presents for evaluation of cough, wheezing, uri sx's for over a week. He has hx of copd and states the only thing that helps when he gets like this is steroids And abx's.  He has been using his saba with nebs at home.   Review of Systems C/o uri No chest pain, SOB, HA, dizziness, vision change, N/V, diarrhea, constipation, dysuria, urinary urgency or frequency, myalgias, arthralgias or rash.     Objective:   Physical Exam  Vital signs noted  Well developed well nourished male.  HEENT - Head atraumatic Normocephalic                Eyes - PERRLA, Conjuctiva - clear Sclera- Clear EOMI                Ears - EAC's Wnl TM's Wnl Gross Hearing WNL                Nose - Nares patent                 Throat - oropharanx wnl Respiratory - Lungs with scattered exp wheezes bilateral Cardiac - RRR S1 and S2 without murmur GI - Abdomen soft Nontender and bowel sounds active x 4       Assessment & Plan:  COPD exacerbation - Plan: amoxicillin (AMOXIL) 875 MG tablet, predniSONE (DELTASONE) 10 MG tablet And take mucinex 2 po bid and also continue neb tx's and follow up prn if not better.  Lysbeth Penner FNP

## 2013-07-26 ENCOUNTER — Other Ambulatory Visit: Payer: Self-pay | Admitting: Family Medicine

## 2013-07-27 NOTE — Telephone Encounter (Signed)
Last seen 04/16/13 B [place]  If approved route to nurse to call into [facility]

## 2013-08-20 ENCOUNTER — Other Ambulatory Visit: Payer: Self-pay | Admitting: Family Medicine

## 2013-08-21 ENCOUNTER — Other Ambulatory Visit: Payer: Self-pay | Admitting: Family Medicine

## 2013-08-21 NOTE — Telephone Encounter (Signed)
Xanax Last OV 04/16/13 Bill please address

## 2013-08-23 ENCOUNTER — Encounter: Payer: Self-pay | Admitting: Family Medicine

## 2013-08-23 ENCOUNTER — Ambulatory Visit (INDEPENDENT_AMBULATORY_CARE_PROVIDER_SITE_OTHER): Payer: Medicare HMO | Admitting: Family Medicine

## 2013-08-23 VITALS — BP 112/68 | HR 84 | Temp 99.6°F | Wt 180.8 lb

## 2013-08-23 DIAGNOSIS — J441 Chronic obstructive pulmonary disease with (acute) exacerbation: Secondary | ICD-10-CM

## 2013-08-23 DIAGNOSIS — E785 Hyperlipidemia, unspecified: Secondary | ICD-10-CM

## 2013-08-23 MED ORDER — METHYLPREDNISOLONE ACETATE 80 MG/ML IJ SUSP: 80.0000 mg | Freq: Once | INTRAMUSCULAR | Status: DC

## 2013-08-23 MED ORDER — METHYLPREDNISOLONE ACETATE 80 MG/ML IJ SUSP
80.0000 mg | Freq: Once | INTRAMUSCULAR | Status: AC
  Administered 2013-08-23: 80 mg via INTRAMUSCULAR

## 2013-08-23 MED ORDER — PREDNISONE 10 MG PO TABS: ORAL_TABLET | ORAL | Status: DC

## 2013-08-23 MED ORDER — ROFLUMILAST 500 MCG PO TABS: 500.0000 ug | ORAL_TABLET | Freq: Every day | ORAL | Status: DC

## 2013-08-23 MED ORDER — AMOXICILLIN 875 MG PO TABS: 875.0000 mg | ORAL_TABLET | Freq: Two times a day (BID) | ORAL | Status: DC

## 2013-08-23 NOTE — Progress Notes (Signed)
   Subjective:    Patient ID: Blake Madden, male    DOB: 11-17-1945, 68 y.o.   MRN: [id number]  HPI  C/o SOB and wheezing.  He has hx of COPD and is having SOB.  He has DOE and he has been having bronchitis sx's and exacerbation sx's.  Review of Systems C/o SOB  No chest pain, HA, dizziness, vision change, N/V, diarrhea, constipation, dysuria, urinary urgency or frequency, myalgias, arthralgias or rash.     Objective:   Physical Exam  Vital signs noted  Well developed well nourished male.  HEENT - Head atraumatic Normocephalic                Eyes - PERRLA, Conjuctiva - clear Sclera- Clear EOMI                Ears - EAC's Wnl TM's Wnl Gross Hearing WNL                Throat - oropharanx wnl Respiratory - Lungs with expiratory wheezes Cardiac - RRR S1 and S2 without murmur GI - Abdomen soft Nontender and bowel sounds active x 4      Assessment & Plan:  COPD exacerbation - Plan: amoxicillin (AMOXIL) 875 MG tablet, methylPREDNISolone acetate (DEPO-MEDROL) injection 80 mg, predniSONE (DELTASONE) 10 MG tablet, methylPREDNISolone acetate (DEPO-MEDROL) injection 80 mg, roflumilast (DALIRESP) 500 MCG TABS tablet  Mucinex otc bid and use nebulizer q 6 hours and prn SOB  Lysbeth Penner FNP

## 2013-08-23 NOTE — Telephone Encounter (Signed)
RX called in .

## 2013-10-19 ENCOUNTER — Other Ambulatory Visit: Payer: Self-pay | Admitting: Family Medicine

## 2013-11-09 ENCOUNTER — Telehealth: Payer: Self-pay | Admitting: Family Medicine

## 2013-11-09 NOTE — Telephone Encounter (Signed)
appt scheduled for 4:30 tomorrow with [facility]

## 2013-11-10 ENCOUNTER — Encounter: Payer: Self-pay | Admitting: Family Medicine

## 2013-11-10 ENCOUNTER — Ambulatory Visit (INDEPENDENT_AMBULATORY_CARE_PROVIDER_SITE_OTHER): Payer: Medicare HMO | Admitting: Family Medicine

## 2013-11-10 ENCOUNTER — Encounter (INDEPENDENT_AMBULATORY_CARE_PROVIDER_SITE_OTHER): Payer: Self-pay

## 2013-11-10 VITALS — BP 126/79 | HR 70 | Temp 97.8°F | Ht 66.5 in | Wt 173.0 lb

## 2013-11-10 DIAGNOSIS — J209 Acute bronchitis, unspecified: Secondary | ICD-10-CM

## 2013-11-10 MED ORDER — METHYLPREDNISOLONE ACETATE 80 MG/ML IJ SUSP
80.0000 mg | Freq: Once | INTRAMUSCULAR | Status: AC
  Administered 2013-11-10: 80 mg via INTRAMUSCULAR

## 2013-11-10 MED ORDER — AMOXICILLIN 875 MG PO TABS: 875.0000 mg | ORAL_TABLET | Freq: Two times a day (BID) | ORAL | Status: DC

## 2013-11-10 NOTE — Progress Notes (Signed)
   Subjective:    Patient ID: Blake Madden, male    DOB: 03/10/1945, 68 y.o.   MRN: [id number]  HPI  This 68 y.o. male presents for evaluation of URI sx's and coughing.  Review of Systems    No chest pain, SOB, HA, dizziness, vision change, N/V, diarrhea, constipation, dysuria, urinary urgency or frequency, myalgias, arthralgias or rash.  Objective:   Physical Exam Vital signs noted  Well developed well nourished male.  HEENT - Head atraumatic Normocephalic                Eyes - PERRLA, Conjuctiva - clear Sclera- Clear EOMI                Ears - EAC's Wnl TM's Wnl Gross Hearing WNL                Throat - oropharanx wnl Respiratory - Lungs exp wheezes bilateral Cardiac - RRR S1 and S2 without murmur GI - Abdomen soft Nontender and bowel sounds active x 4 Extremities - No edema. Neuro - Grossly intact.       Assessment & Plan:  Acute bronchitis, unspecified organism - Plan: methylPREDNISolone acetate (DEPO-MEDROL) injection 80 mg, amoxicillin (AMOXIL) 875 MG tablet  Push po fluids, rest, tylenol and motrin otc prn as directed for fever, arthralgias, and myalgias.  Follow up prn if sx's continue or persist.  Lysbeth Penner FNP

## 2013-11-22 ENCOUNTER — Encounter: Payer: Self-pay | Admitting: Family Medicine

## 2013-11-22 ENCOUNTER — Other Ambulatory Visit: Payer: Self-pay | Admitting: Family Medicine

## 2013-11-22 MED ORDER — LEVOFLOXACIN 500 MG PO TABS: 500.0000 mg | ORAL_TABLET | Freq: Every day | ORAL | Status: DC

## 2013-11-29 ENCOUNTER — Other Ambulatory Visit: Payer: Self-pay | Admitting: Family Medicine

## 2013-11-29 ENCOUNTER — Encounter: Payer: Self-pay | Admitting: Family Medicine

## 2013-11-29 MED ORDER — PREDNISONE 10 MG PO TABS: ORAL_TABLET | ORAL | Status: DC

## 2013-12-06 ENCOUNTER — Encounter: Payer: Self-pay | Admitting: Family Medicine

## 2013-12-07 ENCOUNTER — Other Ambulatory Visit: Payer: Self-pay | Admitting: Family Medicine

## 2013-12-07 DIAGNOSIS — J441 Chronic obstructive pulmonary disease with (acute) exacerbation: Secondary | ICD-10-CM

## 2013-12-07 DIAGNOSIS — J45901 Unspecified asthma with (acute) exacerbation: Secondary | ICD-10-CM

## 2013-12-07 MED ORDER — ALBUTEROL SULFATE HFA 108 (90 BASE) MCG/ACT IN AERS: 2.0000 | INHALATION_SPRAY | Freq: Four times a day (QID) | RESPIRATORY_TRACT | Status: DC | PRN

## 2013-12-07 MED ORDER — ALBUTEROL SULFATE (2.5 MG/3ML) 0.083% IN NEBU: 2.5000 mg | INHALATION_SOLUTION | Freq: Four times a day (QID) | RESPIRATORY_TRACT | Status: DC | PRN

## 2013-12-24 ENCOUNTER — Encounter: Payer: Self-pay | Admitting: Family Medicine

## 2013-12-28 ENCOUNTER — Encounter: Payer: Self-pay | Admitting: Family Medicine

## 2013-12-29 ENCOUNTER — Telehealth: Payer: Self-pay | Admitting: Family Medicine

## 2013-12-29 NOTE — Telephone Encounter (Signed)
Please review

## 2013-12-30 ENCOUNTER — Other Ambulatory Visit: Payer: Self-pay | Admitting: Family Medicine

## 2013-12-30 MED ORDER — ZOLPIDEM TARTRATE 10 MG PO TABS: 10.0000 mg | ORAL_TABLET | Freq: Every evening | ORAL | Status: DC | PRN

## 2013-12-30 NOTE — Telephone Encounter (Signed)
ambien was sent

## 2013-12-30 NOTE — Telephone Encounter (Signed)
Pt aware.

## 2014-01-23 ENCOUNTER — Encounter: Payer: Self-pay | Admitting: Family Medicine

## 2014-01-24 ENCOUNTER — Other Ambulatory Visit: Payer: Self-pay | Admitting: Family Medicine

## 2014-01-24 MED ORDER — LEVOFLOXACIN 500 MG PO TABS: 500.0000 mg | ORAL_TABLET | Freq: Every day | ORAL | Status: DC

## 2014-01-24 MED ORDER — PREDNISONE 10 MG PO TABS: ORAL_TABLET | ORAL | Status: DC

## 2014-01-31 ENCOUNTER — Other Ambulatory Visit: Payer: Self-pay | Admitting: Family Medicine

## 2014-01-31 NOTE — Telephone Encounter (Signed)
Last seen 11/10/13 B [place]

## 2014-02-07 ENCOUNTER — Encounter: Payer: Self-pay | Admitting: Family Medicine

## 2014-02-21 ENCOUNTER — Encounter: Payer: Self-pay | Admitting: Family

## 2014-02-21 ENCOUNTER — Ambulatory Visit (INDEPENDENT_AMBULATORY_CARE_PROVIDER_SITE_OTHER): Payer: Medicare HMO | Admitting: Family

## 2014-02-21 ENCOUNTER — Ambulatory Visit (INDEPENDENT_AMBULATORY_CARE_PROVIDER_SITE_OTHER): Payer: Medicare HMO

## 2014-02-21 VITALS — BP 122/78 | HR 73 | Temp 97.0°F | Ht 66.5 in | Wt 171.0 lb

## 2014-02-21 DIAGNOSIS — J449 Chronic obstructive pulmonary disease, unspecified: Secondary | ICD-10-CM

## 2014-02-21 DIAGNOSIS — J441 Chronic obstructive pulmonary disease with (acute) exacerbation: Secondary | ICD-10-CM

## 2014-02-21 DIAGNOSIS — J45901 Unspecified asthma with (acute) exacerbation: Secondary | ICD-10-CM

## 2014-02-21 DIAGNOSIS — R0602 Shortness of breath: Secondary | ICD-10-CM

## 2014-02-21 MED ORDER — BUDESONIDE-FORMOTEROL FUMARATE 160-4.5 MCG/ACT IN AERO: 2.0000 | INHALATION_SPRAY | Freq: Two times a day (BID) | RESPIRATORY_TRACT | Status: DC

## 2014-02-21 MED ORDER — METHYLPREDNISOLONE ACETATE 80 MG/ML IJ SUSP
80.0000 mg | Freq: Once | INTRAMUSCULAR | Status: AC
  Administered 2014-02-21: 80 mg via INTRAMUSCULAR

## 2014-02-21 MED ORDER — IPRATROPIUM-ALBUTEROL 0.5-2.5 (3) MG/3ML IN SOLN
3.0000 mL | Freq: Once | RESPIRATORY_TRACT | Status: AC
  Administered 2014-02-21: 3 mL via RESPIRATORY_TRACT

## 2014-02-21 MED ORDER — MONTELUKAST SODIUM 10 MG PO TABS: 10.0000 mg | ORAL_TABLET | Freq: Every day | ORAL | Status: DC

## 2014-02-21 MED ORDER — ALBUTEROL SULFATE HFA 108 (90 BASE) MCG/ACT IN AERS: 2.0000 | INHALATION_SPRAY | Freq: Four times a day (QID) | RESPIRATORY_TRACT | Status: DC | PRN

## 2014-02-21 MED ORDER — PREDNISONE 20 MG PO TABS: ORAL_TABLET | ORAL | Status: DC

## 2014-02-21 NOTE — Addendum Note (Signed)
Addended by: Shelbie Ammons on: 02/21/2014 05:25 PM   Modules accepted: Orders, Medications

## 2014-02-21 NOTE — Progress Notes (Addendum)
Subjective:    Patient ID: Blake Madden, male    DOB: 04-Apr-1945, 69 y.o.   MRN: [id number]  Wheezing  This is a new problem. The current episode started 1 to 4 weeks ago. The problem occurs constantly. The problem has been waxing and waning. Associated symptoms include coughing, rhinorrhea, shortness of breath and sputum production. Pertinent negatives include no chills, ear pain, fever, headaches, neck pain or sore throat. The symptoms are aggravated by exercise. He has tried OTC inhaler, rest and steroid inhaler for the symptoms. The treatment provided mild relief. His past medical history is significant for asthma.  Asthma He complains of chest tightness, cough, difficulty breathing, hoarse voice, shortness of breath, sputum production and wheezing. This is a new problem. The current episode started more than 1 month ago. The problem occurs constantly. The problem has been gradually worsening. The cough is productive of sputum. Associated symptoms include rhinorrhea. Pertinent negatives include no ear pain, fever, headaches or sore throat. His symptoms are aggravated by minimal activity. His symptoms are alleviated by ipratropium and rest. He reports minimal improvement on treatment. His past medical history is significant for asthma.      Review of Systems  Constitutional: Negative.  Negative for fever and chills.  HENT: Positive for hoarse voice and rhinorrhea. Negative for ear pain and sore throat.   Respiratory: Positive for cough, sputum production, shortness of breath and wheezing.   Cardiovascular: Negative.   Gastrointestinal: Negative.   Endocrine: Negative.   Genitourinary: Negative.   Musculoskeletal: Negative.  Negative for neck pain.  Neurological: Negative.  Negative for headaches.  Hematological: Negative.   Psychiatric/Behavioral: Negative.   All other systems reviewed and are negative.      Objective:   Physical Exam  Constitutional: He is oriented to person,  place, and time. He appears well-developed and well-nourished. No distress.  HENT:  Head: Normocephalic.  Right Ear: External ear normal.  Left Ear: External ear normal.  Nose: Nose normal.  Mouth/Throat: Oropharynx is clear and moist.  Eyes: Pupils are equal, round, and reactive to light. Right eye exhibits no discharge. Left eye exhibits no discharge.  Neck: Normal range of motion. Neck supple. No thyromegaly present.  Cardiovascular: Normal rate, regular rhythm, normal heart sounds and intact distal pulses.   No murmur heard. Pulmonary/Chest: Effort normal. No respiratory distress. He has wheezes.  Abdominal: Soft. Bowel sounds are normal. He exhibits no distension. There is no tenderness.  Musculoskeletal: Normal range of motion. He exhibits no edema or tenderness.  Neurological: He is alert and oriented to person, place, and time. He has normal reflexes. No cranial nerve deficit.  Skin: Skin is warm and dry. No rash noted. No erythema.  Psychiatric: He has a normal mood and affect. His behavior is normal. Judgment and thought content normal.  Vitals reviewed.   BP 122/78 mmHg  Pulse 73  Temp(Src) 97 F (36.1 C) (Oral)  Ht 5' 6.5" (1.689 m)  Wt 171 lb (77.565 kg)  BMI 27.19 kg/m2  SpO2 94%  Chest X-ray- WNL Preliminary reading by Evelina Dun, FNP [facility]      Assessment & Plan:  1. Asthma, chronic obstructive, with acute exacerbation -RTO in 2 weeks -Pt told not to start taking prednisone til tomorrow - DG Chest 2 View; Future - albuterol (PROAIR HFA) 108 (90 BASE) MCG/ACT inhaler; Inhale 2 puffs into the lungs 4 (four) times daily as needed.  Dispense: 3.7 g; Refill: 11 - methylPREDNISolone acetate (DEPO-MEDROL) injection 80 mg;  Inject 1 mL (80 mg total) into the muscle once. - predniSONE (DELTASONE) 20 MG tablet; 2 po at same time daily for 5 days  Dispense: 10 tablet; Refill: 0 - montelukast (SINGULAIR) 10 MG tablet; Take 1 tablet (10 mg total) by mouth at bedtime.   Dispense: 90 tablet; Refill: 3 - budesonide-formoterol (SYMBICORT) 160-4.5 MCG/ACT inhaler; Inhale 2 puffs into the lungs 2 (two) times daily.  Dispense: 1 Inhaler; Refill: 11  3. Shortness of breath - budesonide-formoterol (SYMBICORT) 160-4.5 MCG/ACT inhaler; Inhale 2 puffs into the lungs 2 (two) times daily.  Dispense: 1 Inhaler; Refill: [place], FNP

## 2014-02-21 NOTE — Addendum Note (Signed)
Addended by: Evelina Dun A on: 02/21/2014 04:37 PM   Modules accepted: Level of Service

## 2014-02-21 NOTE — Patient Instructions (Signed)
Asthma Asthma is a recurring condition in which the airways tighten and narrow. Asthma can make it difficult to breathe. It can cause coughing, wheezing, and shortness of breath. Asthma episodes, also called asthma attacks, range from minor to life-threatening. Asthma cannot be cured, but medicines and lifestyle changes can help control it. CAUSES Asthma is believed to be caused by inherited (genetic) and environmental factors, but its exact cause is unknown. Asthma may be triggered by allergens, lung infections, or irritants in the air. Asthma triggers are different for each person. Common triggers include:   Animal dander.  Dust mites.  Cockroaches.  Pollen from trees or grass.  Mold.  Smoke.  Air pollutants such as dust, household cleaners, hair sprays, aerosol sprays, paint fumes, strong chemicals, or strong odors.  Cold air, weather changes, and winds (which increase molds and pollens in the air).  Strong emotional expressions such as crying or laughing hard.  Stress.  Certain medicines (such as aspirin) or types of drugs (such as beta-blockers).  Sulfites in foods and drinks. Foods and drinks that may contain sulfites include dried fruit, potato chips, and sparkling grape juice.  Infections or inflammatory conditions such as the flu, a cold, or an inflammation of the nasal membranes (rhinitis).  Gastroesophageal reflux disease (GERD).  Exercise or strenuous activity. SYMPTOMS Symptoms may occur immediately after asthma is triggered or many hours later. Symptoms include:  Wheezing.  Excessive nighttime or early morning coughing.  Frequent or severe coughing with a common cold.  Chest tightness.  Shortness of breath. DIAGNOSIS  The diagnosis of asthma is made by a review of your medical history and a physical exam. Tests may also be performed. These may include:  Lung function studies. These tests show how much air you breathe in and out.  Allergy  tests.  Imaging tests such as X-rays. TREATMENT  Asthma cannot be cured, but it can usually be controlled. Treatment involves identifying and avoiding your asthma triggers. It also involves medicines. There are 2 classes of medicine used for asthma treatment:   Controller medicines. These prevent asthma symptoms from occurring. They are usually taken every day.  Reliever or rescue medicines. These quickly relieve asthma symptoms. They are used as needed and provide short-term relief. Your health care provider will help you create an asthma action plan. An asthma action plan is a written plan for managing and treating your asthma attacks. It includes a list of your asthma triggers and how they may be avoided. It also includes information on when medicines should be taken and when their dosage should be changed. An action plan may also involve the use of a device called a peak flow meter. A peak flow meter measures how well the lungs are working. It helps you monitor your condition. HOME CARE INSTRUCTIONS   Take medicines only as directed by your health care provider. Speak with your health care provider if you have questions about how or when to take the medicines.  Use a peak flow meter as directed by your health care provider. Record and keep track of readings.  Understand and use the action plan to help minimize or stop an asthma attack without needing to seek medical care.  Control your home environment in the following ways to help prevent asthma attacks:  Do not smoke. Avoid being exposed to secondhand smoke.  Change your heating and air conditioning filter regularly.  Limit your use of fireplaces and wood stoves.  Get rid of pests (such as roaches and   mice) and their droppings.  Throw away plants if you see mold on them.  Clean your floors and dust regularly. Use unscented cleaning products.  Try to have someone else vacuum for you regularly. Stay out of rooms while they are  being vacuumed and for a short while afterward. If you vacuum, use a dust mask from a hardware store, a double-layered or microfilter vacuum cleaner bag, or a vacuum cleaner with a HEPA filter.  Replace carpet with wood, tile, or vinyl flooring. Carpet can trap dander and dust.  Use allergy-proof pillows, mattress covers, and box spring covers.  Wash bed sheets and blankets every week in hot water and dry them in a dryer.  Use blankets that are made of polyester or cotton.  Clean bathrooms and kitchens with bleach. If possible, have someone repaint the walls in these rooms with mold-resistant paint. Keep out of the rooms that are being cleaned and painted.  Wash hands frequently. SEEK MEDICAL CARE IF:   You have wheezing, shortness of breath, or a cough even if taking medicine to prevent attacks.  The colored mucus you cough up (sputum) is thicker than usual.  Your sputum changes from clear or white to yellow, green, gray, or bloody.  You have any problems that may be related to the medicines you are taking (such as a rash, itching, swelling, or trouble breathing).  You are using a reliever medicine more than 2-3 times per week.  Your peak flow is still at 50-79% of your personal best after following your action plan for 1 hour.  You have a fever. SEEK IMMEDIATE MEDICAL CARE IF:   You seem to be getting worse and are unresponsive to treatment during an asthma attack.  You are short of breath even at rest.  You get short of breath when doing very little physical activity.  You have difficulty eating, drinking, or talking due to asthma symptoms.  You develop chest pain.  You develop a fast heartbeat.  You have a bluish color to your lips or fingernails.  You are light-headed, dizzy, or faint.  Your peak flow is less than 50% of your personal best. MAKE SURE YOU:   Understand these instructions.  Will watch your condition.  Will get help right away if you are not  doing well or get worse. Document Released: 01/14/2005 Document Revised: 05/31/2013 Document Reviewed: 08/13/2012 [facility] Patient Information 2015 [place], [place]. This information is not intended to replace advice given to you by your health care provider. Make sure you discuss any questions you have with your health care provider. Asthma Attack Prevention Although there is no way to prevent asthma from starting, you can take steps to control the disease and reduce its symptoms. Learn about your asthma and how to control it. Take an active role to control your asthma by working with your health care provider to create and follow an asthma action plan. An asthma action plan guides you in:  Taking your medicines properly.  Avoiding things that set off your asthma or make your asthma worse (asthma triggers).  Tracking your level of asthma control.  Responding to worsening asthma.  Seeking emergency care when needed. To track your asthma, keep records of your symptoms, check your peak flow number using a handheld device that shows how well air moves out of your lungs (peak flow meter), and get regular asthma checkups.  WHAT ARE SOME WAYS TO PREVENT AN ASTHMA ATTACK?  Take medicines as directed by your health care  provider.  Keep track of your asthma symptoms and level of control.  With your health care provider, write a detailed plan for taking medicines and managing an asthma attack. Then be sure to follow your action plan. Asthma is an ongoing condition that needs regular monitoring and treatment.  Identify and avoid asthma triggers. Many outdoor allergens and irritants (such as pollen, mold, cold air, and air pollution) can trigger asthma attacks. Find out what your asthma triggers are and take steps to avoid them.  Monitor your breathing. Learn to recognize warning signs of an attack, such as coughing, wheezing, or shortness of breath. Your lung function may decrease before you notice any  signs or symptoms, so regularly measure and record your peak airflow with a home peak flow meter.  Identify and treat attacks early. If you act quickly, you are less likely to have a severe attack. You will also need less medicine to control your symptoms. When your peak flow measurements decrease and alert you to an upcoming attack, take your medicine as instructed and immediately stop any activity that may have triggered the attack. If your symptoms do not improve, get medical help.  Pay attention to increasing quick-relief inhaler use. If you find yourself relying on your quick-relief inhaler, your asthma is not under control. See your health care provider about adjusting your treatment. WHAT CAN MAKE MY SYMPTOMS WORSE? A number of common things can set off or make your asthma symptoms worse and cause temporary increased inflammation of your airways. Keep track of your asthma symptoms for several weeks, detailing all the environmental and emotional factors that are linked with your asthma. When you have an asthma attack, go back to your asthma diary to see which factor, or combination of factors, might have contributed to it. Once you know what these factors are, you can take steps to control many of them. If you have allergies and asthma, it is important to take asthma prevention steps at home. Minimizing contact with the substance to which you are allergic will help prevent an asthma attack. Some triggers and ways to avoid these triggers are: Animal Dander:  Some people are allergic to the flakes of skin or dried saliva from animals with fur or feathers.   There is no such thing as a hypoallergenic dog or cat breed. All dogs or cats can cause allergies, even if they don't shed.  Keep these pets out of your home.  If you are not able to keep a pet outdoors, keep the pet out of your bedroom and other sleeping areas at all times, and keep the door closed.  Remove carpets and furniture covered with  cloth from your home. If that is not possible, keep the pet away from fabric-covered furniture and carpets. Dust Mites: Many people with asthma are allergic to dust mites. Dust mites are tiny bugs that are found in every home in mattresses, pillows, carpets, fabric-covered furniture, bedcovers, clothes, stuffed toys, and other fabric-covered items.   Cover your mattress in a special dust-proof cover.  Cover your pillow in a special dust-proof cover, or wash the pillow each week in hot water. Water must be hotter than 130 F (54.4 C) to kill dust mites. Cold or warm water used with detergent and bleach can also be effective.  Wash the sheets and blankets on your bed each week in hot water.  Try not to sleep or lie on cloth-covered cushions.  Call ahead when traveling and ask for a smoke-free hotel  room. Bring your own bedding and pillows in case the hotel only supplies feather pillows and down comforters, which may contain dust mites and cause asthma symptoms.  Remove carpets from your bedroom and those laid on concrete, if you can.  Keep stuffed toys out of the bed, or wash the toys weekly in hot water or cooler water with detergent and bleach. Cockroaches: Many people with asthma are allergic to the droppings and remains of cockroaches.   Keep food and garbage in closed containers. Never leave food out.  Use poison baits, traps, powders, gels, or paste (for example, boric acid).  If a spray is used to kill cockroaches, stay out of the room until the odor goes away. Indoor Mold:  Fix leaky faucets, pipes, or other sources of water that have mold around them.  Clean floors and moldy surfaces with a fungicide or diluted bleach.  Avoid using humidifiers, vaporizers, or swamp coolers. These can spread molds through the air. Pollen and Outdoor Mold:  When pollen or mold spore counts are high, try to keep your windows closed.  Stay indoors with windows closed from late morning to  afternoon. Pollen and some mold spore counts are highest at that time.  Ask your health care provider whether you need to take anti-inflammatory medicine or increase your dose of the medicine before your allergy season starts. Other Irritants to Avoid:  Tobacco smoke is an irritant. If you smoke, ask your health care provider how you can quit. Ask family members to quit smoking, too. Do not allow smoking in your home or car.  If possible, do not use a wood-burning stove, kerosene heater, or fireplace. Minimize exposure to all sources of smoke, including incense, candles, fires, and fireworks.  Try to stay away from strong odors and sprays, such as perfume, talcum powder, hair spray, and paints.  Decrease humidity in your home and use an indoor air cleaning device. Reduce indoor humidity to below 60%. Dehumidifiers or central air conditioners can do this.  Decrease house dust exposure by changing furnace and air cooler filters frequently.  Try to have someone else vacuum for you once or twice a week. Stay out of rooms while they are being vacuumed and for a short while afterward.  If you vacuum, use a dust mask from a hardware store, a double-layered or microfilter vacuum cleaner bag, or a vacuum cleaner with a HEPA filter.  Sulfites in foods and beverages can be irritants. Do not drink beer or wine or eat dried fruit, processed potatoes, or shrimp if they cause asthma symptoms.  Cold air can trigger an asthma attack. Cover your nose and mouth with a scarf on cold or windy days.  Several health conditions can make asthma more difficult to manage, including a runny nose, sinus infections, reflux disease, psychological stress, and sleep apnea. Work with your health care provider to manage these conditions.  Avoid close contact with people who have a respiratory infection such as a cold or the flu, since your asthma symptoms may get worse if you catch the infection. Wash your hands thoroughly  after touching items that may have been handled by people with a respiratory infection.  Get a flu shot every year to protect against the flu virus, which often makes asthma worse for days or weeks. Also get a pneumonia shot if you have not previously had one. Unlike the flu shot, the pneumonia shot does not need to be given yearly. Medicines:  Talk to your health care  provider about whether it is safe for you to take aspirin or non-steroidal anti-inflammatory medicines (NSAIDs). In a small number of people with asthma, aspirin and NSAIDs can cause asthma attacks. These medicines must be avoided by people who have known aspirin-sensitive asthma. It is important that people with aspirin-sensitive asthma read labels of all over-the-counter medicines used to treat pain, colds, coughs, and fever.  Beta-blockers and ACE inhibitors are other medicines you should discuss with your health care provider. HOW CAN I FIND OUT WHAT I AM ALLERGIC TO? Ask your asthma health care provider about allergy skin testing or blood testing (the RAST test) to identify the allergens to which you are sensitive. If you are found to have allergies, the most important thing to do is to try to avoid exposure to any allergens that you are sensitive to as much as possible. Other treatments for allergies, such as medicines and allergy shots (immunotherapy) are available.  CAN I EXERCISE? Follow your health care provider's advice regarding asthma treatment before exercising. It is important to maintain a regular exercise program, but vigorous exercise or exercise in cold, humid, or dry environments can cause asthma attacks, especially for those people who have exercise-induced asthma. Document Released: 01/02/2009 Document Revised: 01/19/2013 Document Reviewed: 07/22/2012 [facility] Patient Information 2015 [place], [place]. This information is not intended to replace advice given to you by your health care provider. Make sure you discuss  any questions you have with your health care provider.

## 2014-03-09 ENCOUNTER — Ambulatory Visit (INDEPENDENT_AMBULATORY_CARE_PROVIDER_SITE_OTHER): Payer: Commercial Managed Care - HMO | Admitting: Family

## 2014-03-09 ENCOUNTER — Encounter: Payer: Self-pay | Admitting: Family

## 2014-03-09 VITALS — BP 118/74 | HR 72 | Temp 98.0°F | Ht 66.5 in | Wt 174.0 lb

## 2014-03-09 DIAGNOSIS — Z23 Encounter for immunization: Secondary | ICD-10-CM

## 2014-03-09 DIAGNOSIS — J449 Chronic obstructive pulmonary disease, unspecified: Secondary | ICD-10-CM | POA: Diagnosis not present

## 2014-03-09 DIAGNOSIS — J441 Chronic obstructive pulmonary disease with (acute) exacerbation: Secondary | ICD-10-CM

## 2014-03-09 DIAGNOSIS — J45901 Unspecified asthma with (acute) exacerbation: Secondary | ICD-10-CM

## 2014-03-09 NOTE — Progress Notes (Signed)
   Subjective:    Patient ID: Blake Madden, male    DOB: [birth year], 69 y.o.   MRN: [id number]  Pt presents to the office to recheck asthma. Pt states he has greatly improved. He is taking his singulair everyday and using his Tudorza BID and symbicort. Pt states at first he was having to use his albuterol, but since starting these new medications he has not had to use the albuterol at all.  Asthma There is no cough, difficulty breathing or hoarse voice. This is a chronic problem. The current episode started more than 1 year ago. The problem occurs rarely. The problem has been resolved. Pertinent negatives include no ear pain, fever, myalgias, nasal congestion, rhinorrhea, sneezing or sore throat. His symptoms are aggravated by any activity. His symptoms are alleviated by leukotriene antagonist, ipratropium and rest. He reports moderate improvement on treatment. His past medical history is significant for asthma.      Review of Systems  Constitutional: Negative.  Negative for fever.  HENT: Negative.  Negative for ear pain, hoarse voice, rhinorrhea, sneezing and sore throat.   Respiratory: Negative.  Negative for cough.   Cardiovascular: Negative.   Gastrointestinal: Negative.   Endocrine: Negative.   Genitourinary: Negative.   Musculoskeletal: Negative.  Negative for myalgias.  Neurological: Negative.   Hematological: Negative.   Psychiatric/Behavioral: Negative.   All other systems reviewed and are negative.      Objective:   Physical Exam  Constitutional: He is oriented to person, place, and time. He appears well-developed and well-nourished. No distress.  HENT:  Head: Normocephalic.  Right Ear: External ear normal.  Left Ear: External ear normal.  Mouth/Throat: Oropharynx is clear and moist.  Eyes: Pupils are equal, round, and reactive to light. Right eye exhibits no discharge. Left eye exhibits no discharge.  Neck: Normal range of motion. Neck supple. No thyromegaly present.    Cardiovascular: Normal rate, regular rhythm, normal heart sounds and intact distal pulses.   No murmur heard. Pulmonary/Chest: Effort normal and breath sounds normal. No respiratory distress. He has no wheezes.  Abdominal: Soft. Bowel sounds are normal. He exhibits no distension. There is no tenderness.  Musculoskeletal: Normal range of motion. He exhibits no edema or tenderness.  Neurological: He is alert and oriented to person, place, and time. He has normal reflexes. No cranial nerve deficit.  Skin: Skin is warm and dry. No rash noted. No erythema.  Psychiatric: He has a normal mood and affect. His behavior is normal. Judgment and thought content normal.  Vitals reviewed.   BP 118/74 mmHg  Pulse 72  Temp(Src) 98 F (36.7 C) (Oral)  Ht 5' 6.5" (1.689 m)  Wt 174 lb (78.926 kg)  BMI 27.67 kg/m2       Assessment & Plan:  1. Encounter for immunization Flu & pneumonia vaccine given today   2. Asthma, chronic obstructive, with acute exacerbation -Avoid allergens -Discussed preventing attacks -Do not smoke -RTO prn & keep chronic follow ups  Evelina Dun, FNP

## 2014-03-09 NOTE — Patient Instructions (Signed)

## 2014-03-28 ENCOUNTER — Other Ambulatory Visit: Payer: Self-pay | Admitting: Family Medicine

## 2014-03-29 ENCOUNTER — Other Ambulatory Visit: Payer: Self-pay | Admitting: Family Medicine

## 2014-04-25 ENCOUNTER — Ambulatory Visit (INDEPENDENT_AMBULATORY_CARE_PROVIDER_SITE_OTHER): Payer: Commercial Managed Care - HMO | Admitting: Physician Assistant

## 2014-04-25 ENCOUNTER — Encounter: Payer: Self-pay | Admitting: Physician Assistant

## 2014-04-25 VITALS — BP 109/71 | HR 71 | Temp 97.9°F | Ht 66.5 in | Wt 171.0 lb

## 2014-04-25 DIAGNOSIS — J45901 Unspecified asthma with (acute) exacerbation: Secondary | ICD-10-CM | POA: Diagnosis not present

## 2014-04-25 DIAGNOSIS — R062 Wheezing: Secondary | ICD-10-CM

## 2014-04-25 MED ORDER — IPRATROPIUM-ALBUTEROL 0.5-2.5 (3) MG/3ML IN SOLN
3.0000 mL | Freq: Once | RESPIRATORY_TRACT | Status: AC
  Administered 2014-04-25: 3 mL via RESPIRATORY_TRACT

## 2014-04-25 MED ORDER — PREDNISONE 20 MG PO TABS: ORAL_TABLET | ORAL | Status: DC

## 2014-04-25 MED ORDER — IPRATROPIUM-ALBUTEROL 0.5-2.5 (3) MG/3ML IN SOLN: 3.0000 mL | RESPIRATORY_TRACT | Status: DC

## 2014-04-25 MED ORDER — METHYLPREDNISOLONE ACETATE 80 MG/ML IJ SUSP
80.0000 mg | Freq: Once | INTRAMUSCULAR | Status: AC
  Administered 2014-04-25: 80 mg via INTRAMUSCULAR

## 2014-04-25 NOTE — Progress Notes (Signed)
   Subjective:    Patient ID: Blake Madden, male    DOB: 1945/07/07, 69 y.o.   MRN: [id number]  HPI 69 y/o male with h/o asthma, copd presents with c/o increase SOB x 2 weeks. Has tried Symbicort BID but ran out 2 days ago,  In addition to albuterol inhaler 4-5 times daily.     Review of Systems  Constitutional: Positive for fatigue.  HENT: Positive for congestion (chest), rhinorrhea and sneezing. Negative for postnasal drip and sore throat.   Eyes: Negative.   Respiratory: Positive for cough (when SOB occurs), chest tightness (when short of breath) and shortness of breath. Negative for apnea.   Cardiovascular: Negative.   Gastrointestinal: Negative.   Musculoskeletal:       Leg cramping        Objective:   Physical Exam  Cardiovascular: Normal rate and regular rhythm.  Exam reveals no gallop and no friction rub.   No murmur heard. Pulmonary/Chest: Effort normal. No respiratory distress. He has wheezes (inspiratory and expiratory severe).  Auditory tightness in bilateral upper and lower lobes. Decreased after 2 duoneb treatments in clinic  Skin:  No cyanosis   Nursing note and vitals reviewed.         Assessment & Plan:  1. Asthma exacerbation: 2 duoneb treatments given in clinic. O2 Sat remained 94%, however auditory wheezing decreased.   albuterol (PROAIR HFA) 108 (90 BASE) MCG/ACT inhaler; Inhale 2 puffs into the lungs 4 (four) times daily as needed.  - methylPREDNISolone acetate (DEPO-MEDROL) injection 80 mg; Inject 1 mL (80 mg total) into the muscle once. - predniSONE (DELTASONE) 20 MG tablet; 3 po at same time daily for 7 days. Then 3 PO qod x 7 days Dispense: 30 tablet; Refill: 0 - montelukast (SINGULAIR) 10 MG tablet; Take 1 tablet (10 mg total) by mouth at bedtime.  - budesonide-formoterol (SYMBICORT) 160-4.5 MCG/ACT inhaler; Inhale 2 puffs into the lungs 2 (two) times daily.   Referral to Pulmonology d/t frequent exacerbations.  I do not feel that patient  needs antibiotic on todays visit. Advised him to go to ED if SOB worsens prior to his f/u in 2 weeks.

## 2014-04-26 ENCOUNTER — Encounter: Payer: Self-pay | Admitting: Physician Assistant

## 2014-04-26 DIAGNOSIS — R0602 Shortness of breath: Secondary | ICD-10-CM

## 2014-04-26 DIAGNOSIS — J45901 Unspecified asthma with (acute) exacerbation: Secondary | ICD-10-CM

## 2014-04-26 DIAGNOSIS — J441 Chronic obstructive pulmonary disease with (acute) exacerbation: Secondary | ICD-10-CM

## 2014-04-26 MED ORDER — BUDESONIDE-FORMOTEROL FUMARATE 160-4.5 MCG/ACT IN AERO: 2.0000 | INHALATION_SPRAY | Freq: Two times a day (BID) | RESPIRATORY_TRACT | Status: DC

## 2014-04-26 MED ORDER — ALBUTEROL SULFATE HFA 108 (90 BASE) MCG/ACT IN AERS: 2.0000 | INHALATION_SPRAY | Freq: Four times a day (QID) | RESPIRATORY_TRACT | Status: DC | PRN

## 2014-04-26 MED ORDER — BUDESONIDE 1 MG/2ML IN SUSP: 1.0000 mg | Freq: Every day | RESPIRATORY_TRACT | Status: DC

## 2014-04-26 MED ORDER — ALBUTEROL SULFATE (2.5 MG/3ML) 0.083% IN NEBU: 2.5000 mg | INHALATION_SOLUTION | Freq: Four times a day (QID) | RESPIRATORY_TRACT | Status: DC | PRN

## 2014-04-27 ENCOUNTER — Other Ambulatory Visit: Payer: Self-pay | Admitting: Family Medicine

## 2014-04-28 NOTE — Telephone Encounter (Signed)
Last seen in office on 04-25-14. Rx last filled on 12-30-13 for #30 with 3 RFs. Please advise. If approved please route to Pool A so nurse can phone in to pharmacy

## 2014-04-29 NOTE — Telephone Encounter (Signed)
rx called into pharmacy

## 2014-05-06 ENCOUNTER — Encounter: Payer: Self-pay | Admitting: Pulmonary Disease

## 2014-05-06 ENCOUNTER — Ambulatory Visit (INDEPENDENT_AMBULATORY_CARE_PROVIDER_SITE_OTHER): Payer: Commercial Managed Care - HMO | Admitting: Pulmonary Disease

## 2014-05-06 VITALS — BP 102/62 | HR 79 | Temp 98.4°F | Ht 64.0 in | Wt 178.0 lb

## 2014-05-06 DIAGNOSIS — Z77090 Contact with and (suspected) exposure to asbestos: Secondary | ICD-10-CM | POA: Diagnosis not present

## 2014-05-06 DIAGNOSIS — J449 Chronic obstructive pulmonary disease, unspecified: Secondary | ICD-10-CM

## 2014-05-06 DIAGNOSIS — K2100 Gastro-esophageal reflux disease with esophagitis, without bleeding: Secondary | ICD-10-CM

## 2014-05-06 DIAGNOSIS — R0602 Shortness of breath: Secondary | ICD-10-CM

## 2014-05-06 DIAGNOSIS — J45901 Unspecified asthma with (acute) exacerbation: Secondary | ICD-10-CM

## 2014-05-06 DIAGNOSIS — J441 Chronic obstructive pulmonary disease with (acute) exacerbation: Secondary | ICD-10-CM

## 2014-05-06 MED ORDER — PREDNISONE 10 MG PO TABS: ORAL_TABLET | ORAL | Status: DC

## 2014-05-06 MED ORDER — TIOTROPIUM BROMIDE MONOHYDRATE 18 MCG IN CAPS
18.0000 ug | ORAL_CAPSULE | Freq: Every day | RESPIRATORY_TRACT | Status: DC
Start: 2014-05-06 — End: 2014-06-30

## 2014-05-06 MED ORDER — TIOTROPIUM BROMIDE MONOHYDRATE 18 MCG IN CAPS: 18.0000 ug | ORAL_CAPSULE | Freq: Every day | RESPIRATORY_TRACT | Status: DC

## 2014-05-06 NOTE — Patient Instructions (Signed)
Mr. Ledyard- it was nice meeting you today... We updated your med list in our EPIC system...     For your chronic obstructive asthma>>    Continue your NEBULIZER w/ Albuterol & Budesonide once daily as you are doing...       You may use the ALBUTEROL in the nebulizer more often (up to every 4h) if you are tight & wheezing.    Continue the SYMBICORT160- 2 sprays twice daily regularly (every day)...    Continue the SINGULAIR 10mg  one tab daily as you are doing...   We are adding SPIRIVA via the "handihaler"- inhale the contents of 1 capsule daily (every day)... We wrote a new prescription for PREDNISONE 10mg  tabs to take one tab each AM until we see you back in the office...   If you find yourself congested- you may use OTC MUCINEX 600mg  tabs- 2 tabs twice daily w/ fluids... If you are coughing a lot you may use OTC DELSYM- 2 tsp twice daily as needed + OTC cough drops & throat lozenges...  Call for any questions...  Let's plan a follow up visit in 9mo, sooner if needed for problems.Marland KitchenMarland Kitchen

## 2014-05-07 ENCOUNTER — Encounter: Payer: Self-pay | Admitting: Pulmonary Disease

## 2014-05-07 NOTE — Progress Notes (Signed)
Subjective:     Patient ID: Blake Blake Madden, male   DOB: [birth year], 69 y.o.   MRN: [id number]  HPI 69 y/o WM, referred by Blake Blake Madden in [place] for refractory asthma, he is here w/ his daughter Blake Blake Madden who works at the [employer]>  Blake Blake Madden is a non-smoker w/ hx of asthma and increasing SOB over the last few yrs;  Despite meds he has noted increased SOB progressing to DOE w/ ADLs (eg. Dressing, showering) during attacks;  He notes cough & chest congestion w/ sput which varies from thick white to brownish but he denies hemoptysis, no recent f/c/s;  He notes chest tightness & wheezing but denies CP;  He denies hx cardiac dis but has occas palit and elev HR for which he says he was prescribed Metoprolol;  He is taking:  NEBS w/ Albut2.5 & Budes1.0 once daily, Symbicort160-2spBid, AlbutHFA rescue inhaler prn, Singulair10;  He notes the most improvement when on PREDNISONE but symptoms all return when he stops this med...   He is a never smoker, and he says he has no history of lung disease until his daugh reminds him of the ASBESTOSIS> he works for [employer] (started in 1972) & was exposed over the yrs to asbestos at the old [employer] plants, now he works in an office;  He settled a claim w/ the company yrs back but still gets monitored regularly as part of that settlement- he prev saw a Blake Blake Madden but is now monitored by Blake Blake Madden in [place] w/ CXRs, PFTs, and periodic CT scans (see below);  He also saw Dr. Velvet Madden in [place] once in 2014- felt to have obstructive asthmatic bronchitis & rec to continue his Symbicort & Proair, no changes made... FamHx includes lung cancer in his father (also had bladder cancer), and colon cancer in a sister and brother... He is up to date on all his vaccinations> given by Blake Blake Madden...   EXAM reveals> Afeb, VSS, O2sat=97% on RA at rest;  HEENT- neg, no lesions;  Chest- sl decr BS bilat w/ few scat rhonchi & forced exp wheezing but no signs of consolidation;   Heart- RR, norm S1 & S2, gr1/6 SEM w/o rubs or gallops; no clubbing, no edema...   Last CXR in [place] 02/21/14 (I personally reviewed the images) showed norm heart size, bilat increase in the lateral pleural stripe, clear lungs, NAD (and no signif change from old films back to 05/08/12)...  Last CTChest 05/26/13 did not report lateral pleural thickening, several 1-28mm nodules were ident bilat in lungs, no note made of any fibrosis, +coronary calcif, +right renal cyst, sm lucency in right lobe of liver, +gallstones... NOTE: prev CTChest 2012 compared to CTs dating back to 2002 mentioned "no pleural abnormality seen- no infiltrate, mass, or pleural effusion", no evid of malignancy (I do not find any HRCT scans)...  Spirometry 05/06/14 showed FVC=2.16 (54%), FEV1=1.50 (48%), %1sec=69, mid-flows reduced at 34% predicted; suggestive of GOLD stage 2-3 COPD but can't r/o superimposed restrictive component w/o lung volume measurement...   Last LABS in Epic 1/15> reviewed  IMP/ PLAN>>  It is fortunate that Blake Blake Madden has been a never smoker, esp in relation to his asbestos exposure;  He has bouts of obstructive asthmatic bronchitis and current spirometry reveals GOLD Stage 2-3 COPD (I presume from airway remodeling from his poorly controlled asthma);  His symptoms have been refractory to management w/ inhaled meds but has been readily responsive to Prednisone by his history;  In  this regard I propose treating him w/ combined inhaled & oral meds as follows> CONTINUE his NEB w/ Albut2.5 & Budes1.0- once daily as he has been doing, continue his Symbicort160-2spBid, continue his Singulair10 for now, continue his AlbutHFA rescue inhaler vs extra Albut via the NEB prn, we will ADD SPIRIVA via handihaler daily, and ADD PREDNISONE 10mg Qam daily...   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    Past Medical History  Diagnosis Date  . Depression   . GERD (gastroesophageal reflux disease)   . HTN (hypertension)   . Hyperlipemia   . Migraines   . Allergic rhinitis    Past Surgical History  Procedure Laterality Date  . Knee arthroscopy Left     Outpatient Encounter Prescriptions as of 05/06/2014  Medication Sig  . albuterol (PROAIR HFA) 108 (90 BASE) MCG/ACT inhaler Inhale 2 puffs into the lungs 4 (four) times daily as needed.  Marland Kitchen albuterol (PROVENTIL) (2.5 MG/3ML) 0.083% nebulizer solution Take 3 mLs (2.5 mg total) by nebulization every 6 (six) hours as needed for wheezing.  . budesonide (PULMICORT) 1 MG/2ML nebulizer solution Take 2 mLs (1 mg total) by nebulization daily.  . budesonide-formoterol (SYMBICORT) 160-4.5 MCG/ACT inhaler Inhale 2 puffs into the lungs 2 (two) times daily.  . metoprolol (LOPRESSOR) 50 MG tablet TAKE 1 TABLET EVERY DAY  . montelukast (SINGULAIR) 10 MG tablet Take 1 tablet (10 mg total) by mouth at bedtime.  Marland Kitchen omeprazole (PRILOSEC) 20 MG capsule TAKE 1 CAPSULE (20 MG TOTAL) BY MOUTH DAILY.  Marland Kitchen predniSONE (DELTASONE) 20 MG tablet 3 tablets in the am with breakfast x 7 days, then qod x 7 days  . QUEtiapine (SEROQUEL) 200 MG tablet TAKE 1 TABLET (200 MG TOTAL) BY MOUTH AT BEDTIME.  Marland Kitchen venlafaxine XR (EFFEXOR-XR) 150 MG 24 hr capsule TAKE 1 CAPSULE (150 MG TOTAL) BY MOUTH DAILY WITH BREAKFAST.  Marland Kitchen zolpidem (AMBIEN) 10 MG tablet TAKE 1 TABLET BY MOUTH AT BEDTIME AS NEEDED FOR SLEEP  . Aclidinium Bromide (TUDORZA PRESSAIR) 400 MCG/ACT AEPB Inhale 400 mcg into the lungs 2 (two) times daily. (Patient not taking: Reported on 05/06/2014)   No Known Allergies   Family History   Problem Relation Age of Onset  . Lung cancer Father     lung  . Colon cancer Sister   . Bladder Cancer Father   . Colon cancer Brother    History   Social History  . Marital Status: Single    Spouse Name: N/A  . Number of Children: N/A  . Years of Education: N/A   Occupational History  . full time    Social History Main Topics  . Smoking status: Never Smoker   . Smokeless tobacco: Not on file  . Alcohol Use: No     Comment: rare  . Drug Use: No  . Sexual Activity: Not on file   Other Topics Concern  . Not on file   Social History Narrative    Current Medications, Allergies, Past Medical History, Past Surgical History, Family History,  and Social History were reviewed in [employer] record.   Review of Systems              All NEGATIVE except where BOLDED:  Constitutional:  Denies F/C/S, anorexia, unexpected weight change. HEENT:  HA, visual changes, earache, nasal symptoms, sore throat, hoarseness. Resp:  cough, sputum, hemoptysis; SOB, tightness, wheezing. Cardio:  CP, palpit, DOE, orthopnea, edema. GI:  Denies N/V/D/C or blood in stool; reflux, abd pain, distention, gas. GU:  dysuria, freq, urgency, hematuria, flank pain. MS:  joint pain, swelling, tenderness, decr ROM; neck pain, back pain, etc. Neuro:  tremors, seizures, dizziness, syncope, weakness, numbness, gait abn. Skin:  suspicious lesions or skin rash. Heme:  adenopathy, bruising, bleeding. Psyche: Denies confusion, sleep disturbance, hallucinations, anxiety, depression.   Objective:   Physical Exam    Vital Signs:  Reviewed...  General:  WD, WN, 69 y/o WM in NAD; alert & oriented; pleasant & cooperative... HEENT:  [place]/AT; Conjunctiva- pink, Sclera- nonicteric, EOM-wnl, PERRLA, EACs-clear, TMs-wnl; NOSE-clear; THROAT-clear & wnl. Neck:  Supple w/ full ROM; no JVD; normal carotid impulses w/o bruits; no thyromegaly or nodules palpated; no lymphadenopathy. Chest:  Sl decr BS at  bases w/ few scat rhonchi & forced exp wheezing but no signs of consolidation... Heart:  Regular Rhythm; norm S1 & S2 w/ gr1/6 SEM but w/o rubs or gallops detected. Abdomen:  Soft & nontender- no guarding or rebound; normal bowel sounds; no organomegaly or masses palpated. Ext:  Normal ROM; without deformities or arthritic changes; no varicose veins, venous insuffic, or edema;  Pulses intact w/o bruits. Neuro:  CNs II-XII intact; motor testing normal; sensory testing normal; gait normal & balance OK. Derm:  No lesions noted; no rash etc. Lymph:  No cervical, supraclavicular, axillary, or inguinal adenopathy palpated.   Assessment:      IMP/ PLAN>>  It is fortunate that Mr. Carriger has been a never smoker, esp in relation to his asbestos exposure;  He has bouts of obstructive asthmatic bronchitis and current spirometry reveals GOLD Stage 2-3 COPD (I presume from airway remodeling from his poorly controlled asthma);  His symptoms have been refractory to management w/ inhaled meds but has been readily responsive to Prednisone by his history;  In this regard I propose treating him w/ combined inhaled & oral meds as follows> CONTINUE his NEB w/ Albut2.5 & Budes1.0- once daily as he has been doing, continue his Symbicort160-2spBid, continue his Singulair10 for now, continue his AlbutHFA rescue inhaler vs extra Albut via the NEB prn, we will ADD SPIRIVA via handihaler daily, and ADD PREDNISONE 10mg Qam daily...   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      Plan:     Patient's  Medications  New Prescriptions   PREDNISONE (DELTASONE) 10 MG TABLET    Use as directed   TIOTROPIUM (SPIRIVA HANDIHALER) 18 MCG INHALATION CAPSULE    Place 1 capsule (18 mcg total) into inhaler and inhale daily.  Previous Medications   ALBUTEROL (PROAIR HFA) 108 (90 BASE) MCG/ACT  INHALER    Inhale 2 puffs into the lungs 4 (four) times daily as needed.   ALBUTEROL (PROVENTIL) (2.5 MG/3ML) 0.083% NEBULIZER SOLUTION    Take 3 mLs (2.5 mg total) by nebulization every 6 (six) hours as needed for wheezing.   BUDESONIDE (PULMICORT) 1 MG/2ML NEBULIZER SOLUTION    Take 2 mLs (1 mg total) by nebulization daily.   BUDESONIDE-FORMOTEROL (SYMBICORT) 160-4.5 MCG/ACT INHALER    Inhale 2 puffs into the lungs 2 (two) times daily.   METOPROLOL (LOPRESSOR) 50 MG TABLET    TAKE 1 TABLET EVERY DAY   MONTELUKAST (SINGULAIR) 10 MG TABLET    Take 1 tablet (10 mg total) by mouth at bedtime.   OMEPRAZOLE (PRILOSEC) 20 MG CAPSULE    TAKE 1 CAPSULE (20 MG TOTAL) BY MOUTH DAILY.   QUETIAPINE (SEROQUEL) 200 MG TABLET    TAKE 1 TABLET (200 MG TOTAL) BY MOUTH AT BEDTIME.   VENLAFAXINE XR (EFFEXOR-XR) 150 MG 24 HR CAPSULE    TAKE 1 CAPSULE (150 MG TOTAL) BY MOUTH DAILY WITH BREAKFAST.   ZOLPIDEM (AMBIEN) 10 MG TABLET    TAKE 1 TABLET BY MOUTH AT BEDTIME AS NEEDED FOR SLEEP  Modified Medications   No medications on file  Discontinued Medications   No medications on file

## 2014-06-04 ENCOUNTER — Other Ambulatory Visit: Payer: Self-pay | Admitting: Family Medicine

## 2014-06-10 ENCOUNTER — Ambulatory Visit: Payer: Commercial Managed Care - HMO | Admitting: Pulmonary Disease

## 2014-06-19 ENCOUNTER — Other Ambulatory Visit: Payer: Self-pay | Admitting: Family Medicine

## 2014-06-30 ENCOUNTER — Ambulatory Visit (INDEPENDENT_AMBULATORY_CARE_PROVIDER_SITE_OTHER): Payer: Commercial Managed Care - HMO | Admitting: Physician Assistant

## 2014-06-30 ENCOUNTER — Encounter: Payer: Self-pay | Admitting: Physician Assistant

## 2014-06-30 VITALS — BP 104/61 | HR 59 | Temp 97.7°F | Ht 64.0 in | Wt 174.0 lb

## 2014-06-30 DIAGNOSIS — J449 Chronic obstructive pulmonary disease, unspecified: Secondary | ICD-10-CM | POA: Diagnosis not present

## 2014-06-30 DIAGNOSIS — G4700 Insomnia, unspecified: Secondary | ICD-10-CM | POA: Diagnosis not present

## 2014-06-30 DIAGNOSIS — J441 Chronic obstructive pulmonary disease with (acute) exacerbation: Secondary | ICD-10-CM

## 2014-06-30 DIAGNOSIS — J45901 Unspecified asthma with (acute) exacerbation: Secondary | ICD-10-CM

## 2014-06-30 MED ORDER — ZOLPIDEM TARTRATE 10 MG PO TABS: 10.0000 mg | ORAL_TABLET | Freq: Every evening | ORAL | Status: DC | PRN

## 2014-06-30 NOTE — Progress Notes (Signed)
   Subjective:    Patient ID: Blake Madden, male    DOB: [birth year], 69 y.o.   MRN: [id number]  HPI 69 y/o male presents for f/u of chronic asthma. He saw the pulmonologist since his last visit with me and was continued on 10mg  Prednisone daily until his f/u next week. He states that he is feeling much better.   He takes Ambien for insomnia and would like his rx refilled today.     Review of Systems  Constitutional: Negative.   Respiratory: Positive for cough (intermittent ).   Psychiatric/Behavioral: Positive for sleep disturbance.  All other systems reviewed and are negative.      Objective:   Physical Exam  Constitutional: He is oriented to person, place, and time. He appears well-developed and well-nourished.  Cardiovascular: Regular rhythm and normal heart sounds.   Mildly bradycardic    Pulmonary/Chest: Effort normal and breath sounds normal. No respiratory distress. He has no wheezes. He has no rales. He exhibits no tenderness.  Neurological: He is alert and oriented to person, place, and time.  Psychiatric: He has a normal mood and affect. His behavior is normal. Judgment and thought content normal.  Nursing note and vitals reviewed.         Assessment & Plan:  1. Insomnia - Continue Ambien 10mg  prn qhx  2. Asthma, chronic obstructive, with acute exacerbation - Keep follow up with pulmonologist and use inhalers and prednisone as directed   Continue all meds Labs pending Health Maintenance reviewed Diet and exercise encouraged RTOprn   Tiffany A. Gann PA-C    Tiffany A. Benjamin Stain PA-C

## 2014-07-06 ENCOUNTER — Ambulatory Visit: Payer: Commercial Managed Care - HMO | Admitting: Pulmonary Disease

## 2014-08-10 ENCOUNTER — Encounter: Payer: Self-pay | Admitting: Family Medicine

## 2014-08-10 ENCOUNTER — Ambulatory Visit (INDEPENDENT_AMBULATORY_CARE_PROVIDER_SITE_OTHER): Payer: Commercial Managed Care - HMO | Admitting: Family Medicine

## 2014-08-10 VITALS — BP 114/80 | HR 62 | Temp 98.0°F | Ht 64.0 in | Wt 172.0 lb

## 2014-08-10 DIAGNOSIS — I10 Essential (primary) hypertension: Secondary | ICD-10-CM | POA: Diagnosis not present

## 2014-08-10 DIAGNOSIS — M25562 Pain in left knee: Secondary | ICD-10-CM | POA: Diagnosis not present

## 2014-08-10 DIAGNOSIS — M25561 Pain in right knee: Secondary | ICD-10-CM

## 2014-08-10 DIAGNOSIS — E785 Hyperlipidemia, unspecified: Secondary | ICD-10-CM | POA: Insufficient documentation

## 2014-08-10 DIAGNOSIS — N400 Enlarged prostate without lower urinary tract symptoms: Secondary | ICD-10-CM | POA: Diagnosis not present

## 2014-08-10 NOTE — Progress Notes (Signed)
Subjective:    Patient ID: Blake Madden, male    DOB: May 20, 1945, 69 y.o.   MRN: [id number]  HPI 69 year old gentleman here to get referral to orthopedist for evaluation of his knees. He has a history of asthma and a spasticity exposure but recently his breathing has done well. He does not use any of his inhalers on a regular basis.  Regarding his knees he has lots of pain in both knees but the right one is worse. There is some swelling and noise when he goes up and downstairs. He has had arthroscopic surgery on that knee in the past.    Review of Systems  Constitutional: Negative.   HENT: Negative.   Respiratory: Negative.   Cardiovascular: Negative.   Gastrointestinal: Negative.   Musculoskeletal: Positive for neck stiffness.  Neurological: Negative.        Patient Active Problem List   Diagnosis Date Noted  . BPH (benign prostatic hypertrophy)   . HTN (hypertension)   . Hyperlipemia   . Asbestos exposure 05/06/2014  . Asthma, chronic obstructive, with acute exacerbation 07/17/2012  . GERD (gastroesophageal reflux disease) 05/08/2012  . Depression 05/08/2012   Outpatient Encounter Prescriptions as of 08/10/2014  Medication Sig  . Aclidinium Bromide (TUDORZA PRESSAIR) 400 MCG/ACT AEPB Inhale 400 mcg into the lungs 2 (two) times daily.  Marland Kitchen albuterol (PROAIR HFA) 108 (90 BASE) MCG/ACT inhaler Inhale 2 puffs into the lungs 4 (four) times daily as needed.  Marland Kitchen albuterol (PROVENTIL) (2.5 MG/3ML) 0.083% nebulizer solution Take 3 mLs (2.5 mg total) by nebulization every 6 (six) hours as needed for wheezing.  . budesonide (PULMICORT) 1 MG/2ML nebulizer solution Take 2 mLs (1 mg total) by nebulization daily.  . budesonide-formoterol (SYMBICORT) 160-4.5 MCG/ACT inhaler Inhale 2 puffs into the lungs 2 (two) times daily.  . metoprolol (LOPRESSOR) 50 MG tablet TAKE 1 TABLET EVERY DAY  . montelukast (SINGULAIR) 10 MG tablet Take 1 tablet (10 mg total) by mouth at bedtime.  Marland Kitchen omeprazole  (PRILOSEC) 20 MG capsule TAKE 1 CAPSULE (20 MG TOTAL) BY MOUTH DAILY.  Marland Kitchen predniSONE (DELTASONE) 10 MG tablet Use as directed  . QUEtiapine (SEROQUEL) 200 MG tablet TAKE 1 TABLET (200 MG TOTAL) BY MOUTH AT BEDTIME.  Marland Kitchen tiotropium (SPIRIVA HANDIHALER) 18 MCG inhalation capsule Place 1 capsule (18 mcg total) into inhaler and inhale daily.  Marland Kitchen venlafaxine XR (EFFEXOR-XR) 150 MG 24 hr capsule TAKE 1 CAPSULE (150 MG TOTAL) BY MOUTH DAILY WITH BREAKFAST.  Marland Kitchen zolpidem (AMBIEN) 10 MG tablet Take 1 tablet (10 mg total) by mouth at bedtime as needed. for sleep   No facility-administered encounter medications on file as of 08/10/2014.    Objective:   Physical Exam  Constitutional: He appears well-developed and well-nourished.  Cardiovascular: Normal rate and regular rhythm.   Pulmonary/Chest: Effort normal and breath sounds normal.  Genitourinary: Prostate normal.  There is a palpable ridge on exam of the prostate  Musculoskeletal:  Right knee is tender. There is some effusion as well as crepitance. There is no instability          Assessment & Plan:  1. BPH (benign prostatic hypertrophy) Patient does have symptoms of enlarged prostate. There is family history in father of prostate cancer - PSA, total and free  2. Hyperlipemia Door clear this is been a problem but he is on no medication - CMP14+EGFR - Lipid panel  3. Essential hypertension Blood pressure is well controlled on current regimen of metoprolol - CMP14+EGFR  4. Knee  pain, bilateral Probable degenerative arthritis we will make necessary referral - AMB referral to orthopedics

## 2014-08-11 ENCOUNTER — Telehealth: Payer: Self-pay | Admitting: *Deleted

## 2014-08-11 LAB — CMP14+EGFR
ALBUMIN: 4.4 g/dL (ref 3.6–4.8)
ALT: 16 IU/L (ref 0–44)
AST: 14 IU/L (ref 0–40)
Albumin/Globulin Ratio: 1.8 (ref 1.1–2.5)
Alkaline Phosphatase: 76 IU/L (ref 39–117)
BILIRUBIN TOTAL: 1.2 mg/dL (ref 0.0–1.2)
BUN/Creatinine Ratio: 20 (ref 10–22)
BUN: 13 mg/dL (ref 8–27)
CO2: 26 mmol/L (ref 18–29)
Calcium: 9.8 mg/dL (ref 8.6–10.2)
Chloride: 101 mmol/L (ref 97–108)
Creatinine, Ser: 0.65 mg/dL — ABNORMAL LOW (ref 0.76–1.27)
GFR calc non Af Amer: 99 mL/min/{1.73_m2} (ref >59)
GFR, EST AFRICAN AMERICAN: 115 mL/min/{1.73_m2} (ref >59)
GLOBULIN, TOTAL: 2.4 g/dL (ref 1.5–4.5)
Glucose: 100 mg/dL — ABNORMAL HIGH (ref 65–99)
Potassium: 4.7 mmol/L (ref 3.5–5.2)
SODIUM: 143 mmol/L (ref 134–144)
Total Protein: 6.8 g/dL (ref 6.0–8.5)

## 2014-08-11 LAB — LIPID PANEL
CHOL/HDL RATIO: 7 ratio — AB (ref 0.0–5.0)
CHOLESTEROL TOTAL: 251 mg/dL — AB (ref 100–199)
HDL: 36 mg/dL — ABNORMAL LOW (ref >39)
LDL Calculated: 147 mg/dL — ABNORMAL HIGH (ref 0–99)
TRIGLYCERIDES: 341 mg/dL — AB (ref 0–149)
VLDL Cholesterol Cal: 68 mg/dL — ABNORMAL HIGH (ref 5–40)

## 2014-08-11 LAB — PSA, TOTAL AND FREE
PSA FREE PCT: 18.4 %
PSA, Free: 0.35 ng/mL
Prostate Specific Ag, Serum: 1.9 ng/mL (ref 0.0–4.0)

## 2014-08-11 NOTE — Telephone Encounter (Signed)
Pt notified of results Verbalizes understanding 

## 2014-08-11 NOTE — Telephone Encounter (Signed)
-----   Message from Wardell Honour, MD sent at 08/11/2014  8:02 AM EDT ----- Blood chemistries show normal glucose renal function electrolytes and liver enzymes.  PSA is normal; lipids show elevated cholesterol at 251 elevated triglycerides of 341 and LDL bad cholesterol 147 these numbers are in a gray area which means we can treat if he would like but it is not mandatory

## 2014-10-04 ENCOUNTER — Other Ambulatory Visit: Payer: Self-pay | Admitting: Physician Assistant

## 2014-10-05 NOTE — Telephone Encounter (Signed)
Last filled 09/05/14, last seen 06/30/14. Route to pool A, nurse call in at [facility]

## 2014-10-06 NOTE — Telephone Encounter (Signed)
rx called into pharmacy

## 2014-11-30 ENCOUNTER — Other Ambulatory Visit: Payer: Self-pay | Admitting: Physician Assistant

## 2014-12-19 ENCOUNTER — Ambulatory Visit (INDEPENDENT_AMBULATORY_CARE_PROVIDER_SITE_OTHER): Payer: Commercial Managed Care - HMO | Admitting: *Deleted

## 2014-12-19 DIAGNOSIS — Z23 Encounter for immunization: Secondary | ICD-10-CM | POA: Diagnosis not present

## 2014-12-20 ENCOUNTER — Other Ambulatory Visit: Payer: Self-pay | Admitting: *Deleted

## 2014-12-20 DIAGNOSIS — J441 Chronic obstructive pulmonary disease with (acute) exacerbation: Secondary | ICD-10-CM

## 2014-12-20 DIAGNOSIS — J45901 Unspecified asthma with (acute) exacerbation: Secondary | ICD-10-CM

## 2014-12-20 MED ORDER — OMEPRAZOLE 20 MG PO CPDR: 20.0000 mg | DELAYED_RELEASE_CAPSULE | Freq: Every day | ORAL | Status: DC

## 2014-12-20 MED ORDER — METOPROLOL TARTRATE 50 MG PO TABS: 50.0000 mg | ORAL_TABLET | Freq: Every day | ORAL | Status: DC

## 2014-12-20 MED ORDER — VENLAFAXINE HCL ER 150 MG PO CP24: ORAL_CAPSULE | ORAL | Status: DC

## 2014-12-20 MED ORDER — QUETIAPINE FUMARATE 200 MG PO TABS: ORAL_TABLET | ORAL | Status: DC

## 2014-12-20 MED ORDER — MONTELUKAST SODIUM 10 MG PO TABS: 10.0000 mg | ORAL_TABLET | Freq: Every day | ORAL | Status: DC

## 2014-12-20 NOTE — Telephone Encounter (Signed)
Requested medications sent to [facility] #[phone] with no refills.  Patient will need to be seen before additional refills will be due.   Ambien will need to be printed and patient can mail in. Prescription setup and sent to Dr Sabra Heck to approve tomorrow. If approved print and have nurse call to pickup.

## 2014-12-21 MED ORDER — ZOLPIDEM TARTRATE 10 MG PO TABS: 10.0000 mg | ORAL_TABLET | Freq: Every evening | ORAL | Status: DC | PRN

## 2014-12-21 NOTE — Telephone Encounter (Signed)
Prescription placed at front desk, patient informed

## 2015-01-03 ENCOUNTER — Other Ambulatory Visit: Payer: Self-pay | Admitting: Physician Assistant

## 2015-01-03 ENCOUNTER — Telehealth: Payer: Self-pay

## 2015-01-03 NOTE — Telephone Encounter (Signed)
Insurance prior authorized Zolpidem

## 2015-01-04 ENCOUNTER — Telehealth: Payer: Self-pay | Admitting: Physician Assistant

## 2015-01-04 NOTE — Telephone Encounter (Signed)
Needs about 2 weeks worth sent locally, not to mail order

## 2015-01-04 NOTE — Telephone Encounter (Signed)
Last seen 08/10/14  Dr Sabra Heck  If approved route to nurse to call into [facility]

## 2015-01-05 MED ORDER — ZOLPIDEM TARTRATE 10 MG PO TABS: 10.0000 mg | ORAL_TABLET | Freq: Every day | ORAL | Status: DC

## 2015-01-05 NOTE — Telephone Encounter (Signed)
[phone] until mail order arrives, pt aware.

## 2015-01-05 NOTE — Telephone Encounter (Signed)
[facility].

## 2015-02-10 ENCOUNTER — Telehealth: Payer: Self-pay | Admitting: Physician Assistant

## 2015-02-17 ENCOUNTER — Encounter: Payer: Commercial Managed Care - HMO | Admitting: Family Medicine

## 2015-02-20 ENCOUNTER — Encounter: Payer: Self-pay | Admitting: Family Medicine

## 2015-02-20 ENCOUNTER — Ambulatory Visit (INDEPENDENT_AMBULATORY_CARE_PROVIDER_SITE_OTHER): Payer: Commercial Managed Care - HMO | Admitting: Family Medicine

## 2015-02-20 VITALS — BP 90/59 | HR 61 | Temp 97.1°F | Ht 64.0 in | Wt 174.0 lb

## 2015-02-20 DIAGNOSIS — T17308A Unspecified foreign body in larynx causing other injury, initial encounter: Secondary | ICD-10-CM

## 2015-02-20 DIAGNOSIS — N400 Enlarged prostate without lower urinary tract symptoms: Secondary | ICD-10-CM

## 2015-02-20 DIAGNOSIS — I10 Essential (primary) hypertension: Secondary | ICD-10-CM | POA: Diagnosis not present

## 2015-02-20 DIAGNOSIS — E785 Hyperlipidemia, unspecified: Secondary | ICD-10-CM | POA: Diagnosis not present

## 2015-02-20 DIAGNOSIS — R1310 Dysphagia, unspecified: Secondary | ICD-10-CM | POA: Diagnosis not present

## 2015-02-20 DIAGNOSIS — Z1211 Encounter for screening for malignant neoplasm of colon: Secondary | ICD-10-CM | POA: Diagnosis not present

## 2015-02-20 NOTE — Progress Notes (Signed)
Subjective:    Patient ID: Blake Madden, male    DOB: April 25, 1945, 70 y.o.   MRN: [id number]  HPI Pt here for follow up and management of chronic medical problems which includes hyperlipidemia and hypertension. He is taking medications regularly. Patient is accompanied by his wife who has multiple concerns. First there are some choking and vomiting episodes with eating. He has a history of esophageal stricture with dilatation but it has been some years symptoms sound like he may be having a recurrence. There is bilateral knee pain right greater than left. Patient has had injections before but he does not feel symptoms at this time warrant treatment. There is a history of umbilical hernia. It is not associated with pain and that would like to have that evaluated. He also had an episode of chest tightness and tingling after having some vomiting. Will do a cardiogram to assess. Risk factors for heart disease are absent. There is a history of bipolar disease. Medications for that included Seroquel and Effexor. He has been on as high as 200 mg Seroquel per day but cannot function the next day when he takes that dose. We discussed reducing dose to 50 mg. He also takes metoprolol for what was described as a rapid heart rate. His heart rate today is 61 and blood pressure is 90/59. I suggested decreasing metoprolol to 25 mg a day and monitoring blood pressure and heart rate.     Patient Active Problem List   Diagnosis Date Noted  . BPH (benign prostatic hypertrophy)   . HTN (hypertension)   . Hyperlipemia   . Asbestos exposure 05/06/2014  . Asthma, chronic obstructive, with acute exacerbation ([place]) 07/17/2012  . GERD (gastroesophageal reflux disease) 05/08/2012  . Depression 05/08/2012   Outpatient Encounter Prescriptions as of 02/20/2015  Medication Sig  . metoprolol (LOPRESSOR) 50 MG tablet Take 1 tablet (50 mg total) by mouth daily.  . montelukast (SINGULAIR) 10 MG tablet Take 1 tablet (10 mg  total) by mouth at bedtime.  . Omega-3 Fatty Acids (FISH OIL) 1200 MG CAPS Take 2 capsules by mouth daily.  Marland Kitchen omeprazole (PRILOSEC) 20 MG capsule Take 1 capsule (20 mg total) by mouth daily.  . QUEtiapine (SEROQUEL) 200 MG tablet TAKE 1 TABLET (200 MG TOTAL) BY MOUTH AT BEDTIME.  . Red Yeast Rice 600 MG CAPS Take 1 capsule by mouth daily.  Marland Kitchen venlafaxine XR (EFFEXOR-XR) 150 MG 24 hr capsule TAKE 1 CAPSULE (150 MG TOTAL) BY MOUTH DAILY WITH BREAKFAST.  Marland Kitchen zolpidem (AMBIEN) 10 MG tablet Take 1 tablet (10 mg total) by mouth at bedtime.  . Aclidinium Bromide (TUDORZA PRESSAIR) 400 MCG/ACT AEPB Inhale 400 mcg into the lungs 2 (two) times daily. (Patient not taking: Reported on 02/20/2015)  . albuterol (PROAIR HFA) 108 (90 BASE) MCG/ACT inhaler Inhale 2 puffs into the lungs 4 (four) times daily as needed. (Patient not taking: Reported on 02/20/2015)  . albuterol (PROVENTIL) (2.5 MG/3ML) 0.083% nebulizer solution Take 3 mLs (2.5 mg total) by nebulization every 6 (six) hours as needed for wheezing. (Patient not taking: Reported on 02/20/2015)  . budesonide (PULMICORT) 1 MG/2ML nebulizer solution Take 2 mLs (1 mg total) by nebulization daily. (Patient not taking: Reported on 02/20/2015)  . budesonide-formoterol (SYMBICORT) 160-4.5 MCG/ACT inhaler Inhale 2 puffs into the lungs 2 (two) times daily. (Patient not taking: Reported on 02/20/2015)  . tiotropium (SPIRIVA HANDIHALER) 18 MCG inhalation capsule Place 1 capsule (18 mcg total) into inhaler and inhale daily. (Patient not taking:  Reported on 02/20/2015)  . [DISCONTINUED] predniSONE (DELTASONE) 10 MG tablet Use as directed   No facility-administered encounter medications on file as of 02/20/2015.      Review of Systems  HENT: Negative.   Eyes: Negative.   Respiratory: Negative.   Cardiovascular: Positive for chest pain (episode of tingeling).  Gastrointestinal: Negative.        Possible hernia   Choking and vomiting  Endocrine: Negative.        Episodes  of sweats  Genitourinary: Negative.   Musculoskeletal: Positive for arthralgias (right knee pain).  Skin: Negative.   Allergic/Immunologic: Negative.   Neurological: Positive for weakness.  Hematological: Negative.   Psychiatric/Behavioral: Negative.        Objective:   Physical Exam  Constitutional: He is oriented to person, place, and time. He appears well-developed and well-nourished.  HENT:  Head: Normocephalic.  Mouth/Throat: Oropharynx is clear and moist.  Eyes: Conjunctivae are normal. Pupils are equal, round, and reactive to light.  Neck: Normal range of motion.  Cardiovascular: Normal rate, regular rhythm, normal heart sounds and intact distal pulses.   Pulmonary/Chest: Effort normal and breath sounds normal.  Abdominal: Soft. Bowel sounds are normal.  No inguinal hernias appreciated but he does have a small umbilical hernia  Genitourinary: Prostate normal.  Musculoskeletal: Normal range of motion.  Neurological: He is alert and oriented to person, place, and time.          Assessment & Plan:  1. BPH (benign prostatic hypertrophy) Sounds like he is status post TURP. There are no current problems  2. Hyperlipemia LDL in July was 147 but when numbers are applied to the see the risk assessment would appear he might need to be treated - Lipid panel  3. Essential hypertension Blood pressure is low today suggested reducing metoprolol from 50-25 mg - CMP14+EGFR - EKG 12-Lead  4. Special screening for malignant neoplasms, colon Has been 10 years since last colonoscopy - Ambulatory referral to Gastroenterology  5. Choking, initial encounter There is history of esophageal stricture with dilatation. He needs reevaluation - Ambulatory referral to Gastroenterology   Wardell Honour MD

## 2015-02-20 NOTE — Patient Instructions (Signed)
Medicare Annual Wellness Visit  [place] and the medical providers at [facility] strive to bring you the best medical care.  In doing so we not only want to address your current medical conditions and concerns but also to detect new conditions early and prevent illness, disease and health-related problems.    Medicare offers a yearly Wellness Visit which allows our clinical staff to assess your need for preventative services including immunizations, lifestyle education, counseling to decrease risk of preventable diseases and screening for fall risk and other medical concerns.    This visit is provided free of charge (no copay) for all Medicare recipients. The clinical pharmacists at [facility] have begun to conduct these Wellness Visits which will also include a thorough review of all your medications.    As you primary medical provider recommend that you make an appointment for your Annual Wellness Visit if you have not done so already this year.  You may set up this appointment before you leave today or you may call back [phone]) and schedule an appointment.  Please make sure when you call that you mention that you are scheduling your Annual Wellness Visit with the clinical pharmacist so that the appointment may be made for the proper length of time.     Continue current medications. Continue good therapeutic lifestyle changes which include good diet and exercise. Fall precautions discussed with patient. If an FOBT was given today- please return it to our front desk. If you are over 50 years old - you may need Prevnar 13 or the adult Pneumonia vaccine.  **Flu shots are available--- please call and schedule a FLU-CLINIC appointment**  After your visit with us today you will receive a survey in the mail or online from [employer] regarding your care with us. Please take a moment to fill this out. Your feedback is very  important to us as you can help us better understand your patient needs as well as improve your experience and satisfaction. WE CARE ABOUT YOU!!!    

## 2015-02-21 ENCOUNTER — Encounter: Payer: Self-pay | Admitting: Family Medicine

## 2015-02-21 LAB — CMP14+EGFR
ALK PHOS: 76 IU/L (ref 39–117)
ALT: 15 IU/L (ref 0–44)
AST: 16 IU/L (ref 0–40)
Albumin/Globulin Ratio: 1.4 (ref 1.1–2.5)
Albumin: 3.7 g/dL (ref 3.5–4.8)
BILIRUBIN TOTAL: 0.8 mg/dL (ref 0.0–1.2)
BUN/Creatinine Ratio: 21 (ref 10–22)
BUN: 22 mg/dL (ref 8–27)
CALCIUM: 8.7 mg/dL (ref 8.6–10.2)
CHLORIDE: 100 mmol/L (ref 96–106)
CO2: 24 mmol/L (ref 18–29)
Creatinine, Ser: 1.05 mg/dL (ref 0.76–1.27)
GFR calc Af Amer: 83 mL/min/{1.73_m2} (ref >59)
GFR, EST NON AFRICAN AMERICAN: 72 mL/min/{1.73_m2} (ref >59)
Globulin, Total: 2.7 g/dL (ref 1.5–4.5)
Glucose: 87 mg/dL (ref 65–99)
POTASSIUM: 4 mmol/L (ref 3.5–5.2)
Sodium: 140 mmol/L (ref 134–144)
Total Protein: 6.4 g/dL (ref 6.0–8.5)

## 2015-02-21 LAB — LIPID PANEL
CHOLESTEROL TOTAL: 201 mg/dL — AB (ref 100–199)
Chol/HDL Ratio: 5.6 ratio units — ABNORMAL HIGH (ref 0.0–5.0)
HDL: 36 mg/dL — AB (ref >39)
LDL Calculated: 134 mg/dL — ABNORMAL HIGH (ref 0–99)
TRIGLYCERIDES: 157 mg/dL — AB (ref 0–149)
VLDL Cholesterol Cal: 31 mg/dL (ref 5–40)

## 2015-02-28 ENCOUNTER — Encounter: Payer: Self-pay | Admitting: Physician Assistant

## 2015-02-28 ENCOUNTER — Other Ambulatory Visit: Payer: Self-pay | Admitting: Family

## 2015-02-28 ENCOUNTER — Ambulatory Visit (INDEPENDENT_AMBULATORY_CARE_PROVIDER_SITE_OTHER): Payer: Commercial Managed Care - HMO | Admitting: Physician Assistant

## 2015-02-28 ENCOUNTER — Other Ambulatory Visit: Payer: Self-pay | Admitting: Emergency Medicine

## 2015-02-28 VITALS — BP 106/72 | HR 84 | Ht 67.0 in | Wt 174.0 lb

## 2015-02-28 DIAGNOSIS — R1310 Dysphagia, unspecified: Secondary | ICD-10-CM | POA: Diagnosis not present

## 2015-02-28 DIAGNOSIS — Z800 Family history of malignant neoplasm of digestive organs: Secondary | ICD-10-CM

## 2015-02-28 DIAGNOSIS — K219 Gastro-esophageal reflux disease without esophagitis: Secondary | ICD-10-CM

## 2015-02-28 NOTE — Progress Notes (Signed)
Patient ID: Blake Madden, male   DOB: [birth year], 70 y.o.   MRN: [id number]    HPI:  Blake Madden is a 70 y.o.   male  referred by Blake Honour, MD for evaluation of dysphagia. [name] has a history of hyperlipidemia, hypertension, benign prostatic hypertrophy, migraines, GERD, depression, bipolar disorder, esophageal strictures, asthma, asbestos exposure, and pulmonary fibrosis.  Blake Madden denies any change in his bowel habits or stool caliber. He has had no bloody or tarry stools. He denies any anorexia or unexplained weight loss. He states that he has a sister who was diagnosed with colon cancer at 30 and a brother who was diagnosed with colon cancer at 59. He states his mother died of lung disease at 65, his father died of lung cancer at 42. Blake Madden has had one colonoscopy 10 or 11 years ago at "a doctor's office in [place]". He does not remember the doctor's name and he does not remember if he had any polyps. He reports that he has a long-standing history of GERD and has intermittently been on omeprazole. He states that 10 or 11 years ago he had an EGD with dilation of "a band of scar tissue" in [place]. He states it was done at a clinic and he doesn't remember the address of the clinic or the name of the physician. He states he had no problems swallowing since then until about 6 months ago when he again began to have difficulty swallowing solids and more recently liquids. He frequently has to spit food up because it will not go down. He has been getting frequent heartburn and has been using omeprazole 20 mg by mouth every morning with fair relief. He has no epigastric pain, nausea, or vomiting. He denies use of nonsteroidal anti-inflammatory drugs. He denies use of tobacco or alcohol.   Past Medical History  Diagnosis Date  . Depression   . GERD (gastroesophageal reflux disease)   . Migraines   . Allergic rhinitis   . BPH (benign prostatic hypertrophy)   . HTN (hypertension)   .  Hyperlipemia     Past Surgical History  Procedure Laterality Date  . Knee arthroscopy Left    Family History  Problem Relation Age of Onset  . Lung cancer Father     lung  . Colon cancer Sister   . Bladder Cancer Father   . Colon cancer Brother    Social History  Substance Use Topics  . Smoking status: Never Smoker   . Smokeless tobacco: Never Used  . Alcohol Use: No     Comment: rare   Current Outpatient Prescriptions  Medication Sig Dispense Refill  . Aclidinium Bromide (TUDORZA PRESSAIR) 400 MCG/ACT AEPB Inhale 400 mcg into the lungs 2 (two) times daily. 1 each 11  . albuterol (PROAIR HFA) 108 (90 BASE) MCG/ACT inhaler Inhale 2 puffs into the lungs 4 (four) times daily as needed. 3.7 g 11  . albuterol (PROVENTIL) (2.5 MG/3ML) 0.083% nebulizer solution Take 3 mLs (2.5 mg total) by nebulization every 6 (six) hours as needed for wheezing. 75 mL 12  . budesonide (PULMICORT) 1 MG/2ML nebulizer solution Take 2 mLs (1 mg total) by nebulization daily. 60 mL 12  . budesonide-formoterol (SYMBICORT) 160-4.5 MCG/ACT inhaler Inhale 2 puffs into the lungs 2 (two) times daily. 1 Inhaler 11  . metoprolol (LOPRESSOR) 50 MG tablet Take 1 tablet (50 mg total) by mouth daily. 90 tablet 0  . montelukast (SINGULAIR) 10 MG tablet Take 1 tablet (10 mg  total) by mouth at bedtime. 90 tablet 0  . Omega-3 Fatty Acids (FISH OIL) 1200 MG CAPS Take 2 capsules by mouth daily.    Marland Kitchen omeprazole (PRILOSEC) 20 MG capsule Take 1 capsule (20 mg total) by mouth daily. 90 capsule 0  . QUEtiapine (SEROQUEL) 200 MG tablet TAKE 1 TABLET (200 MG TOTAL) BY MOUTH AT BEDTIME. 90 tablet 0  . Red Yeast Rice 600 MG CAPS Take 1 capsule by mouth daily.    Marland Kitchen tiotropium (SPIRIVA HANDIHALER) 18 MCG inhalation capsule Place 1 capsule (18 mcg total) into inhaler and inhale daily. 30 capsule 12  . venlafaxine XR (EFFEXOR-XR) 150 MG 24 hr capsule TAKE 1 CAPSULE (150 MG TOTAL) BY MOUTH DAILY WITH BREAKFAST. 90 capsule 0  . zolpidem  (AMBIEN) 10 MG tablet Take 1 tablet (10 mg total) by mouth at bedtime. 30 tablet 0  . montelukast (SINGULAIR) 10 MG tablet TAKE 1 TABLET (10 MG TOTAL) BY MOUTH AT BEDTIME. 90 tablet 3   No current facility-administered medications for this visit.   No Known Allergies   Review of Systems: Gen: Denies any fever, chills, sweats, anorexia, fatigue, weakness, malaise, weight loss, and sleep disorder CV: Denies chest pain, angina, palpitations, syncope, orthopnea, PND, peripheral edema, and claudication. Resp: Denies dyspnea at rest, dyspnea with exercise, cough, sputum, wheezing, coughing up blood, and pleurisy. GI: Denies vomiting blood, jaundice, and fecal incontinence.  Has dysphagia to solids and liquids. GU : Denies urinary burning, blood in urine, urinary frequency, urinary hesitancy, nocturnal urination, and urinary incontinence. MS: Denies joint pain, limitation of movement, and swelling, stiffness, low back pain, extremity pain. Denies muscle weakness, cramps, atrophy.  Derm: Denies rash, itching, dry skin, hives, moles, warts, or unhealing ulcers.  Psych: Denies depression, anxiety, memory loss, suicidal ideation, hallucinations, paranoia, and confusion. Heme: Denies bruising, bleeding, and enlarged lymph nodes. Neuro:  Denies any headaches, dizziness, paresthesias. Endo:  Denies any problems with DM, thyroid, adrenal function    Physical Exam: BP 106/72 mmHg  Pulse 84  Ht 5\' 7"  (1.702 m)  Wt 174 lb (78.926 kg)  BMI 27.25 kg/m2 Constitutional: Pleasant,well-developed,male in no acute distress. HEENT: Normocephalic and atraumatic. Conjunctivae are normal. No scleral icterus. Neck supple.  Cardiovascular: Normal rate, regular rhythm.  Pulmonary/chest: Effort normal and breath sounds normal. No wheezing, rales or rhonchi. Abdominal: Soft, nondistended, nontender. Bowel sounds active throughout. There are no masses palpable. No hepatomegaly. Small umbilical hernia.  Extremities:  no edema Lymphadenopathy: No cervical adenopathy noted. Neurological: Alert and oriented to person place and time. Skin: Skin is warm and dry. No rashes noted. Psychiatric: Normal mood and affect. Behavior is normal.  ASSESSMENT AND PLAN: #1. Family history colon cancer. Patient has been advised to undergo colonoscopy to screen for polyps or neoplasia.The risks, benefits, and alternatives to colonoscopy with possible biopsy and possible polypectomy were discussed with the patient and they consent to proceed.   #2. Dysphagia. Patient has a history of esophageal strictures that has required dilation in the past. He will be scheduled for an EGD to assess for esophagitis, gastritis, etc. as well as to dilate stricture if any are noted.The risks, benefits, and alternatives to endoscopy with possible biopsy and possible dilation were discussed with the patient and they consent to proceed.   #3. GERD. An antireflux regimen has been reviewed. Patient has been instructed to continue omeprazole 20 mg by mouth every morning.  The recommendations will be made pending the findings of the above.    Eriyana Sweeten, Cecille Rubin  P PA-C 02/28/2015, 8:36 AM  CC: Blake Honour, MD

## 2015-02-28 NOTE — Progress Notes (Signed)
Agree w/ Ms. Hvozdovic's note and mangement.  

## 2015-02-28 NOTE — Patient Instructions (Signed)

## 2015-03-25 ENCOUNTER — Other Ambulatory Visit: Payer: Self-pay | Admitting: Family Medicine

## 2015-03-27 NOTE — Telephone Encounter (Signed)
Last seen 02/20/15  Dr Sabra Heck   If approved route for mail order

## 2015-03-28 NOTE — Telephone Encounter (Signed)
Pt aware rx approved and to come pick up rx for ambien.

## 2015-04-10 ENCOUNTER — Other Ambulatory Visit: Payer: Self-pay | Admitting: Family Medicine

## 2015-04-10 NOTE — Telephone Encounter (Signed)
Patient's pharmacy, [facility], has not filled prescription for Ambien yet because they need to speak with [place].

## 2015-04-11 NOTE — Telephone Encounter (Signed)
Called x 2 04/11/15, held over 10 min both times

## 2015-04-20 MED ORDER — ZOLPIDEM TARTRATE 10 MG PO TABS: ORAL_TABLET | ORAL | Status: DC

## 2015-04-20 NOTE — Telephone Encounter (Signed)
RX ready for pick up 

## 2015-04-20 NOTE — Telephone Encounter (Signed)
Aware,script for  ambein ready.

## 2015-04-20 NOTE — Telephone Encounter (Signed)
His last rx was not filled, this will print. Call him asap, so he can send to mail orderl

## 2015-04-25 ENCOUNTER — Ambulatory Visit (AMBULATORY_SURGERY_CENTER): Payer: Commercial Managed Care - HMO | Admitting: Internal Medicine

## 2015-04-25 ENCOUNTER — Encounter: Payer: Self-pay | Admitting: Internal Medicine

## 2015-04-25 VITALS — BP 122/76 | HR 66 | Temp 96.8°F | Resp 15 | Ht 67.0 in | Wt 174.0 lb

## 2015-04-25 DIAGNOSIS — K253 Acute gastric ulcer without hemorrhage or perforation: Secondary | ICD-10-CM

## 2015-04-25 DIAGNOSIS — Z1211 Encounter for screening for malignant neoplasm of colon: Secondary | ICD-10-CM

## 2015-04-25 DIAGNOSIS — K263 Acute duodenal ulcer without hemorrhage or perforation: Secondary | ICD-10-CM | POA: Diagnosis not present

## 2015-04-25 DIAGNOSIS — D125 Benign neoplasm of sigmoid colon: Secondary | ICD-10-CM

## 2015-04-25 DIAGNOSIS — Z800 Family history of malignant neoplasm of digestive organs: Secondary | ICD-10-CM | POA: Diagnosis not present

## 2015-04-25 DIAGNOSIS — R1310 Dysphagia, unspecified: Secondary | ICD-10-CM | POA: Diagnosis not present

## 2015-04-25 DIAGNOSIS — K259 Gastric ulcer, unspecified as acute or chronic, without hemorrhage or perforation: Secondary | ICD-10-CM | POA: Diagnosis not present

## 2015-04-25 MED ORDER — SODIUM CHLORIDE 0.9 % IV SOLN
500.0000 mL | INTRAVENOUS | Status: DC
Start: 2015-04-25 — End: 2015-04-25

## 2015-04-25 NOTE — Patient Instructions (Addendum)
I found some ulcers in the stomach and duodenum. I took some biopsies. I dilated the esophagus also.  I found and removed one colon polyp - tiny and looks benign.  I will let you know pathology results and when to have another routine colonoscopy by mail.  I appreciate the opportunity to care for you. Gatha Mayer, MD, [facility]   Discharge instructions given. Handouts on polyps,diverticulosis and a high fiber diet. Resume previous medications. YOU HAD AN ENDOSCOPIC PROCEDURE TODAY AT [facility]:   Refer to the procedure report that was given to you for any specific questions about what was found during the examination.  If the procedure report does not answer your questions, please call your gastroenterologist to clarify.  If you requested that your care partner not be given the details of your procedure findings, then the procedure report has been included in a sealed envelope for you to review at your convenience later.  YOU SHOULD EXPECT: Some feelings of bloating in the abdomen. Passage of more gas than usual.  Walking can help get rid of the air that was put into your GI tract during the procedure and reduce the bloating. If you had a lower endoscopy (such as a colonoscopy or flexible sigmoidoscopy) you may notice spotting of blood in your stool or on the toilet paper. If you underwent a bowel prep for your procedure, you may not have a normal bowel movement for a few days.  Please Note:  You might notice some irritation and congestion in your nose or some drainage.  This is from the oxygen used during your procedure.  There is no need for concern and it should clear up in a day or so.  SYMPTOMS TO REPORT IMMEDIATELY:   Following lower endoscopy (colonoscopy or flexible sigmoidoscopy):  Excessive amounts of blood in the stool  Significant tenderness or worsening of abdominal pains  Swelling of the abdomen that is new, acute  Fever of 100F or  higher   Following upper endoscopy (EGD)  Vomiting of blood or coffee ground material  New chest pain or pain under the shoulder blades  Painful or persistently difficult swallowing  New shortness of breath  Fever of 100F or higher  Black, tarry-looking stools  For urgent or emergent issues, a gastroenterologist can be reached at any hour by calling [phone].   DIET: Your first meal following the procedure should be a small meal and then it is ok to progress to your normal diet. Heavy or fried foods are harder to digest and may make you feel nauseous or bloated.  Likewise, meals heavy in dairy and vegetables can increase bloating.  Drink plenty of fluids but you should avoid alcoholic beverages for 24 hours.  ACTIVITY:  You should plan to take it easy for the rest of today and you should NOT DRIVE or use heavy machinery until tomorrow (because of the sedation medicines used during the test).    FOLLOW UP: Our staff will call the number listed on your records the next business day following your procedure to check on you and address any questions or concerns that you may have regarding the information given to you following your procedure. If we do not reach you, we will leave a message.  However, if you are feeling well and you are not experiencing any problems, there is no need to return our call.  We will assume that you have returned to your regular daily activities without incident.  If any biopsies were taken you will be contacted by phone or by letter within the next 1-3 weeks.  Please call us at [phone] if you have not heard about the biopsies in 3 weeks.    SIGNATURES/CONFIDENTIALITY: You and/or your care partner have signed paperwork which will be entered into your electronic medical record.  These signatures attest to the fact that that the information above on your After Visit Summary has been reviewed and is understood.  Full responsibility of the confidentiality of  this discharge information lies with you and/or your care-partner.

## 2015-04-25 NOTE — Progress Notes (Signed)
A Annd O x 3 Report to RN

## 2015-04-25 NOTE — Op Note (Signed)
[facility] Patient Name: Blake Madden Procedure Date: 04/25/2015 3:08 PM MRN: [id number] Endoscopist: Gatha Mayer , MD Age: 70 Referring MD:  Date of Birth: 06-15-1945 Gender: Male Procedure:                Upper GI endoscopy Indications:              Dysphagia Medicines:                Propofol total dose 150 mg IV, Monitored Anesthesia                            Care Procedure:                Pre-Anesthesia Assessment:                           - Prior to the procedure, a History and Physical                            was performed, and patient medications and                            allergies were reviewed. The patient's tolerance of                            previous anesthesia was also reviewed. The risks                            and benefits of the procedure and the sedation                            options and risks were discussed with the patient.                            All questions were answered, and informed consent                            was obtained. Prior Anticoagulants: The patient has                            taken no previous anticoagulant or antiplatelet                            agents. ASA Grade Assessment: II - A patient with                            mild systemic disease. After reviewing the risks                            and benefits, the patient was deemed in                            satisfactory condition to undergo the procedure.  After obtaining informed consent, the endoscope was                            passed under direct vision. Throughout the                            procedure, the patient's blood pressure, pulse, and                            oxygen saturations were monitored continuously. The                            Model GIF-[id number]) scope was introduced                            through the mouth, and advanced to the second part                            of  duodenum. The upper GI endoscopy was                            accomplished without difficulty. The patient                            tolerated the procedure well. Scope In: Scope Out: Findings:      No endoscopic abnormality was evident in the esophagus to explain the       patient's complaint of dysphagia. It was decided, however, to proceed       with dilation of the entire esophagus. The scope was withdrawn. Dilation       was performed with a Maloney dilator with mild resistance at 24 Fr.      Few non-bleeding superficial gastric ulcers with no stigmata of bleeding       were found in the prepyloric region of the stomach. Biopsies were taken       with a cold forceps for histology. Verification of patient       identification for the specimen was done. Estimated blood loss was       minimal.      Few non-bleeding superficial duodenal ulcers with no stigmata of       bleeding were found in the duodenal bulb.      The exam was otherwise without abnormality.      The cardia and gastric fundus were normal on retroflexion. Complications:            No immediate complications. Estimated Blood Loss:     Estimated blood loss was minimal. Impression:               - No endoscopic esophageal abnormality to explain                            patient's dysphagia. Esophagus dilated. Dilated.                           - Non-bleeding gastric ulcers with no stigmata of  bleeding. Biopsied.                           - Multiple non-bleeding duodenal ulcers with no                            stigmata of bleeding.                           - The examination was otherwise normal. Recommendation:           - Patient has a contact number available for                            emergencies. The signs and symptoms of potential                            delayed complications were discussed with the                            patient. Return to normal activities tomorrow.                             Written discharge instructions were provided to the                            patient.                           - Clear liquids x 1 hour then soft foods rest of                            day. Start prior diet tomorrow.                           - Continue present medications.                           - Await pathology results. May need higher PPI dpse                           - See the other procedure note for documentation of                            additional recommendations. Procedure Code(s):        --- Professional ---                           [id number], Esophagogastroduodenoscopy, flexible,                            transoral; with biopsy, single or multiple                           43450, Dilation of esophagus, by unguided sound or  bougie, single or multiple passes CPT copyright 2016 [employer]. All rights reserved. Gatha Mayer, MD 04/25/2015 3:50:19 PM This report has been signed electronically. Number of Addenda: 0 CC Letter to:             Lillette Boxer. Sabra Heck Referring MD:      Lillette Boxer. Sabra Heck

## 2015-04-25 NOTE — Progress Notes (Signed)
Called to room to assist during endoscopic procedure.  Patient ID and intended procedure confirmed with present staff. Received instructions for my participation in the procedure from the performing physician.  

## 2015-04-25 NOTE — Op Note (Signed)
Blake Madden Patient Name: Blake Madden Procedure Date: 04/25/2015 3:09 PM MRN: [id number] Endoscopist: Gatha Mayer , MD Age: 70 Referring MD:  Date of Birth: [birth year] Gender: Male Procedure:                Colonoscopy Indications:              Screening in patient at increased risk: Colorectal                            cancer in brother 46 or older, Screening in patient                            at increased risk: Colorectal cancer in sister                            before age 44 Medicines:                Propofol total dose 100 mg IV, Monitored Anesthesia                            Care Procedure:                Pre-Anesthesia Assessment:                           - Prior to the procedure, a History and Physical                            was performed, and patient medications and                            allergies were reviewed. The patient's tolerance of                            previous anesthesia was also reviewed. The risks                            and benefits of the procedure and the sedation                            options and risks were discussed with the patient.                            All questions were answered, and informed consent                            was obtained. Prior Anticoagulants: The patient has                            taken no previous anticoagulant or antiplatelet                            agents. ASA Grade Assessment: II - A patient with  mild systemic disease. After reviewing the risks                            and benefits, the patient was deemed in                            satisfactory condition to undergo the procedure.                           - Prior to the procedure, a History and Physical                            was performed, and patient medications and                            allergies were reviewed. The patient's tolerance of                            previous anesthesia  was also reviewed. The risks                            and benefits of the procedure and the sedation                            options and risks were discussed with the patient.                            All questions were answered, and informed consent                            was obtained. Prior Anticoagulants: The patient has                            taken no previous anticoagulant or antiplatelet                            agents. ASA Grade Assessment: II - A patient with                            mild systemic disease. After reviewing the risks                            and benefits, the patient was deemed in                            satisfactory condition to undergo the procedure.                           After obtaining informed consent, the colonoscope                            was passed under direct vision. Throughout the  procedure, the patient's blood pressure, pulse, and                            oxygen saturations were monitored continuously. The                            Model CF-HQ190L [id number]) scope was introduced                            through the anus and advanced to the the cecum,                            identified by appendiceal orifice and ileocecal                            valve. The colonoscopy was performed without                            difficulty. The patient tolerated the procedure                            well. The quality of the bowel preparation was                            good. The bowel preparation used was Miralax. The                            ileocecal valve, appendiceal orifice, and rectum                            were photographed. Scope In: 3:25:58 PM Scope Out: 3:40:20 PM Scope Withdrawal Time: 0 hours 11 minutes 0 seconds  Total Procedure Duration: 0 hours 14 minutes 22 seconds  Findings:      A 4 mm polyp was found in the distal sigmoid colon. The polyp was       sessile. The  polyp was removed with a cold snare. Resection and       retrieval were complete. Verification of patient identification for the       specimen was done. Estimated blood loss was minimal.      A few diverticula were found in the sigmoid colon.      The exam was otherwise without abnormality on direct and retroflexion       views. Complications:            No immediate complications. Estimated Blood Loss:     Estimated blood loss: none. Impression:               - One 4 mm polyp in the distal sigmoid colon,                            removed with a cold snare. Resected and retrieved.                           - Diverticulosis in the sigmoid colon.                           -  The examination was otherwise normal on direct                            and retroflexion views. Recommendation:           - Patient has a contact number available for                            emergencies. The signs and symptoms of potential                            delayed complications were discussed with the                            patient. Return to normal activities tomorrow.                            Written discharge instructions were provided to the                            patient.                           - Resume previous diet.                           - Continue present medications.                           - Repeat colonoscopy is recommended for                            surveillance. The colonoscopy date will be                            determined after pathology results from today's                            exam become available for review. Procedure Code(s):        --- Professional ---                           [phone], Colonoscopy, flexible; with removal of                            tumor(s), polyp(s), or other lesion(s) by snare                            technique CPT copyright 2016 [employer]. All rights reserved. Gatha Mayer, MD 04/25/2015 3:56:54 PM This  report has been signed electronically. Number of Addenda: 0 Referring MD:      Lillette Boxer. Sabra Heck

## 2015-04-26 ENCOUNTER — Telehealth: Payer: Self-pay | Admitting: *Deleted

## 2015-04-26 NOTE — Telephone Encounter (Signed)
  Follow up Call-  Call back number 04/25/2015  Post procedure Call Back phone  # [phone]  Permission to leave phone message Yes     Patient questions:  Do you have a fever, pain , or abdominal swelling? No. Pain Score  0 *  Have you tolerated food without any problems? Yes.    Have you been able to return to your normal activities? Yes.    Do you have any questions about your discharge instructions: Diet   No. Medications  No. Follow up visit  No.  Do you have questions or concerns about your Care? No.  Actions: * If pain score is 4 or above: No action needed, pain <4.

## 2015-05-04 ENCOUNTER — Other Ambulatory Visit: Payer: Self-pay

## 2015-05-04 MED ORDER — OMEPRAZOLE 40 MG PO CPDR: 40.0000 mg | DELAYED_RELEASE_CAPSULE | Freq: Every day | ORAL | Status: DC

## 2015-05-09 ENCOUNTER — Other Ambulatory Visit: Payer: Self-pay | Admitting: Family

## 2015-06-16 ENCOUNTER — Ambulatory Visit (INDEPENDENT_AMBULATORY_CARE_PROVIDER_SITE_OTHER): Payer: Commercial Managed Care - HMO | Admitting: Physician Assistant

## 2015-06-16 ENCOUNTER — Encounter: Payer: Self-pay | Admitting: Physician Assistant

## 2015-06-16 VITALS — BP 118/79 | HR 76 | Temp 98.0°F | Ht 67.0 in | Wt 169.2 lb

## 2015-06-16 DIAGNOSIS — J209 Acute bronchitis, unspecified: Secondary | ICD-10-CM | POA: Diagnosis not present

## 2015-06-16 MED ORDER — FLUTICASONE-SALMETEROL 100-50 MCG/DOSE IN AEPB: 1.0000 | INHALATION_SPRAY | Freq: Two times a day (BID) | RESPIRATORY_TRACT | Status: DC

## 2015-06-16 MED ORDER — AMOXICILLIN-POT CLAVULANATE 875-125 MG PO TABS: 1.0000 | ORAL_TABLET | Freq: Two times a day (BID) | ORAL | Status: DC

## 2015-06-16 MED ORDER — METHYLPREDNISOLONE ACETATE 80 MG/ML IJ SUSP
80.0000 mg | Freq: Once | INTRAMUSCULAR | Status: AC
  Administered 2015-06-16: 80 mg via INTRAMUSCULAR

## 2015-06-16 NOTE — Progress Notes (Signed)
Subjective:     Patient ID: Blake Madden, male   DOB: 1945-08-05, 70 y.o.   MRN: [id number]  HPI Pt with a hx of asthma and bronchitis Prev referral to Pulm who placed pt on Pred taper but states he was not told to f/u He has been using and Alb inhaler and neb machine for sx He has noted increase in use of both recently but they have not been helping + prod cough of yellow sputum  Review of Systems  Constitutional: Negative.   HENT: Positive for congestion and postnasal drip. Negative for rhinorrhea, sinus pressure, sneezing and sore throat.   Respiratory: Positive for cough, chest tightness and wheezing.   Cardiovascular: Negative.        Objective:   Physical Exam  Constitutional: He appears well-developed and well-nourished.  HENT:  Right Ear: External ear normal.  Left Ear: External ear normal.  Mouth/Throat: Oropharynx is clear and moist.  Neck: Neck supple.  Cardiovascular: Normal rate, regular rhythm and normal heart sounds.   Pulmonary/Chest: Effort normal. He has wheezes.  Lymphadenopathy:    He has no cervical adenopathy.  Nursing note and vitals reviewed.      Assessment:     1. Acute bronchitis, unspecified organism        Plan:     DepoMedrol 80 IM in the office today Augmentin 875mg  bid x 10 days Continue with Alb inh/nebs prn Add Flonase 100/50 to regimen to see if this helps Pt informed to rinse mouth after use F/U with regular provider Dr Sabra Heck in 2 weeks

## 2015-06-16 NOTE — Patient Instructions (Signed)

## 2015-08-25 ENCOUNTER — Other Ambulatory Visit: Payer: Self-pay | Admitting: Family Medicine

## 2015-08-29 ENCOUNTER — Other Ambulatory Visit: Payer: Self-pay | Admitting: Family

## 2015-08-29 ENCOUNTER — Other Ambulatory Visit: Payer: Self-pay | Admitting: Family Medicine

## 2015-08-29 DIAGNOSIS — J45901 Unspecified asthma with (acute) exacerbation: Secondary | ICD-10-CM

## 2015-08-29 DIAGNOSIS — J441 Chronic obstructive pulmonary disease with (acute) exacerbation: Secondary | ICD-10-CM

## 2015-08-29 MED ORDER — METOPROLOL TARTRATE 50 MG PO TABS: 50.0000 mg | ORAL_TABLET | Freq: Every day | ORAL | 0 refills | Status: DC

## 2015-08-29 MED ORDER — ZOLPIDEM TARTRATE 10 MG PO TABS: ORAL_TABLET | ORAL | 0 refills | Status: DC

## 2015-08-29 MED ORDER — VENLAFAXINE HCL ER 150 MG PO CP24: ORAL_CAPSULE | ORAL | 0 refills | Status: DC

## 2015-08-29 NOTE — Telephone Encounter (Signed)
Please okay refill 1  for the requested medicines of Effexor Toprol and Ambien for only 1 month.

## 2015-08-29 NOTE — Telephone Encounter (Signed)
Prescription was placed at front desk, patient notified via voicemail.

## 2015-09-01 ENCOUNTER — Ambulatory Visit (INDEPENDENT_AMBULATORY_CARE_PROVIDER_SITE_OTHER): Payer: Commercial Managed Care - HMO | Admitting: Family

## 2015-09-01 ENCOUNTER — Encounter: Payer: Self-pay | Admitting: Family

## 2015-09-01 ENCOUNTER — Ambulatory Visit (INDEPENDENT_AMBULATORY_CARE_PROVIDER_SITE_OTHER): Payer: Commercial Managed Care - HMO

## 2015-09-01 ENCOUNTER — Telehealth: Payer: Self-pay | Admitting: Family Medicine

## 2015-09-01 VITALS — BP 132/77 | HR 88 | Temp 97.3°F | Ht 67.0 in | Wt 170.6 lb

## 2015-09-01 DIAGNOSIS — J209 Acute bronchitis, unspecified: Secondary | ICD-10-CM | POA: Diagnosis not present

## 2015-09-01 DIAGNOSIS — R062 Wheezing: Secondary | ICD-10-CM

## 2015-09-01 MED ORDER — PREDNISONE 20 MG PO TABS: ORAL_TABLET | ORAL | 0 refills | Status: DC

## 2015-09-01 MED ORDER — METHYLPREDNISOLONE ACETATE 80 MG/ML IJ SUSP
80.0000 mg | Freq: Once | INTRAMUSCULAR | Status: AC
  Administered 2015-09-01: 80 mg via INTRAMUSCULAR

## 2015-09-01 NOTE — Patient Instructions (Signed)
f Acute Bronchitis Bronchitis is inflammation of the airways that extend from the windpipe into the lungs (bronchi). The inflammation often causes mucus to develop. This leads to a cough, which is the most common symptom of bronchitis.  In acute bronchitis, the condition usually develops suddenly and goes away over time, usually in a couple weeks. Smoking, allergies, and asthma can make bronchitis worse. Repeated episodes of bronchitis may cause further lung problems.  CAUSES Acute bronchitis is most often caused by the same virus that causes a cold. The virus can spread from person to person (contagious) through coughing, sneezing, and touching contaminated objects. SIGNS AND SYMPTOMS   Cough.   Fever.   Coughing up mucus.   Body aches.   Chest congestion.   Chills.   Shortness of breath.   Sore throat.  DIAGNOSIS  Acute bronchitis is usually diagnosed through a physical exam. Your health care provider will also ask you questions about your medical history. Tests, such as chest X-rays, are sometimes done to rule out other conditions.  TREATMENT  Acute bronchitis usually goes away in a couple weeks. Oftentimes, no medical treatment is necessary. Medicines are sometimes given for relief of fever or cough. Antibiotic medicines are usually not needed but may be prescribed in certain situations. In some cases, an inhaler may be recommended to help reduce shortness of breath and control the cough. A cool mist vaporizer may also be used to help thin bronchial secretions and make it easier to clear the chest.  HOME CARE INSTRUCTIONS  Get plenty of rest.   Drink enough fluids to keep your urine clear or pale yellow (unless you have a medical condition that requires fluid restriction). Increasing fluids may help thin your respiratory secretions (sputum) and reduce chest congestion, and it will prevent dehydration.   Take medicines only as directed by your health care provider.  If  you were prescribed an antibiotic medicine, finish it all even if you start to feel better.  Avoid smoking and secondhand smoke. Exposure to cigarette smoke or irritating chemicals will make bronchitis worse. If you are a smoker, consider using nicotine gum or skin patches to help control withdrawal symptoms. Quitting smoking will help your lungs heal faster.   Reduce the chances of another bout of acute bronchitis by washing your hands frequently, avoiding people with cold symptoms, and trying not to touch your hands to your mouth, nose, or eyes.   Keep all follow-up visits as directed by your health care provider.  SEEK MEDICAL CARE IF: Your symptoms do not improve after 1 week of treatment.  SEEK IMMEDIATE MEDICAL CARE IF:  You develop an increased fever or chills.   You have chest pain.   You have severe shortness of breath.  You have bloody sputum.   You develop dehydration.  You faint or repeatedly feel like you are going to pass out.  You develop repeated vomiting.  You develop a severe headache. MAKE SURE YOU:   Understand these instructions.  Will watch your condition.  Will get help right away if you are not doing well or get worse.   This information is not intended to replace advice given to you by your health care provider. Make sure you discuss any questions you have with your health care provider.   Document Released: 02/22/2004 Document Revised: 02/04/2014 Document Reviewed: 07/07/2012 Elsevier Interactive Patient Education [employer].

## 2015-09-01 NOTE — Progress Notes (Signed)
Subjective:    Patient ID: Blake Madden, male    DOB: 17-Jul-1945, 70 y.o.   MRN: [id number]  Pt has appt with Pulmonologist 09/12/15. Cough  This is a new problem. The current episode started 1 to 4 weeks ago. The problem occurs every few minutes. The cough is productive of sputum. Associated symptoms include headaches, nasal congestion, rhinorrhea, shortness of breath and wheezing. Pertinent negatives include no chills, ear congestion, ear pain, hemoptysis, sore throat or weight loss. The symptoms are aggravated by lying down. He has tried rest, steroid inhaler and OTC cough suppressant for the symptoms. The treatment provided mild relief.      Review of Systems  Constitutional: Negative for chills and weight loss.  HENT: Positive for rhinorrhea. Negative for ear pain and sore throat.   Respiratory: Positive for cough, shortness of breath and wheezing. Negative for hemoptysis.   Neurological: Positive for headaches.  All other systems reviewed and are negative.      Objective:   Physical Exam  Constitutional: He is oriented to person, place, and time. He appears well-developed and well-nourished. No distress.  HENT:  Head: Normocephalic.  Right Ear: External ear normal.  Left Ear: External ear normal.  Nose: Nose normal.  Mouth/Throat: Oropharynx is clear and moist.  Eyes: Pupils are equal, round, and reactive to light. Right eye exhibits no discharge. Left eye exhibits no discharge.  Neck: Normal range of motion. Neck supple. No thyromegaly present.  Cardiovascular: Normal rate, regular rhythm, normal heart sounds and intact distal pulses.   No murmur heard. Pulmonary/Chest: Effort normal. No respiratory distress. He has wheezes.  Abdominal: Soft. Bowel sounds are normal. He exhibits no distension. There is no tenderness.  Musculoskeletal: Normal range of motion. He exhibits no edema or tenderness.  Neurological: He is alert and oriented to person, place, and time. He has  normal reflexes. No cranial nerve deficit.  Skin: Skin is warm and dry. No rash noted. No erythema.  Psychiatric: He has a normal mood and affect. His behavior is normal. Judgment and thought content normal.  Vitals reviewed.     BP 132/77   Pulse 88   Temp 97.3 F (36.3 C) (Oral)   Ht 5\' 7"  (1.702 m)   Wt 170 lb 9.6 oz (77.4 kg)   SpO2 94%   BMI 26.72 kg/m      Assessment & Plan:  1. Acute bronchitis, unspecified organism -- Take meds as prescribed - Use a cool mist humidifier  -Use saline nose sprays frequently -Saline irrigations of the nose can be very helpful if done frequently.  * 4X daily for 1 week*  * Use of a nettie pot can be helpful with this. Follow directions with this* -Force fluids -For any cough or congestion  Use plain Mucinex- regular strength or max strength is fine   * Children- consult with Pharmacist for dosing -For fever or aces or pains- take tylenol or ibuprofen appropriate for age and weight.  * for fevers greater than 101 orally you may alternate ibuprofen and tylenol every  3 hours. -Throat lozenges if help -Keep appt with Pulmonologist appt!!1 Keep chronic follow up appts - methylPREDNISolone acetate (DEPO-MEDROL) injection 80 mg; Inject 1 mL (80 mg total) into the muscle once. - predniSONE (DELTASONE) 20 MG tablet; Take 3 tabs daily for 1 week, then 2 tabs for 1 week, then 1 tab for one week  Dispense: 42 tablet; Refill: 0 - DG Chest 2 View; Future  2. Wheezing -  DG Chest 2 View; Future  Evelina Dun, FNP

## 2015-09-01 NOTE — Telephone Encounter (Signed)
done

## 2015-09-11 ENCOUNTER — Telehealth: Payer: Self-pay | Admitting: Family Medicine

## 2015-09-11 NOTE — Telephone Encounter (Signed)
Called pt, rx up front

## 2015-09-12 ENCOUNTER — Ambulatory Visit (INDEPENDENT_AMBULATORY_CARE_PROVIDER_SITE_OTHER): Payer: Commercial Managed Care - HMO | Admitting: Pulmonary Disease

## 2015-09-12 NOTE — Telephone Encounter (Signed)
It was also up front, but only one was picked up

## 2015-12-11 ENCOUNTER — Other Ambulatory Visit: Payer: Self-pay | Admitting: Family Medicine

## 2015-12-11 DIAGNOSIS — J441 Chronic obstructive pulmonary disease with (acute) exacerbation: Secondary | ICD-10-CM

## 2015-12-11 DIAGNOSIS — J45901 Unspecified asthma with (acute) exacerbation: Secondary | ICD-10-CM

## 2015-12-11 MED ORDER — ZOLPIDEM TARTRATE 10 MG PO TABS: 10.0000 mg | ORAL_TABLET | Freq: Every evening | ORAL | 3 refills | Status: DC | PRN

## 2015-12-11 MED ORDER — MONTELUKAST SODIUM 10 MG PO TABS
10.0000 mg | ORAL_TABLET | Freq: Every day | ORAL | 1 refills | Status: DC
Start: 2015-12-11 — End: 2017-07-14

## 2015-12-11 MED ORDER — MONTELUKAST SODIUM 10 MG PO TABS: 10.0000 mg | ORAL_TABLET | Freq: Every day | ORAL | 0 refills | Status: DC

## 2015-12-11 NOTE — Telephone Encounter (Signed)
Christy = I refilled the Singulair as requested , but can you address the ambien.   If you approve - route to nurse to phone in

## 2015-12-11 NOTE — Telephone Encounter (Signed)
Pt aware of all!! 

## 2015-12-12 ENCOUNTER — Other Ambulatory Visit: Payer: Self-pay | Admitting: Family Medicine

## 2015-12-20 ENCOUNTER — Encounter: Payer: Self-pay | Admitting: Family

## 2015-12-20 ENCOUNTER — Ambulatory Visit (INDEPENDENT_AMBULATORY_CARE_PROVIDER_SITE_OTHER): Payer: Commercial Managed Care - HMO | Admitting: Family

## 2015-12-20 VITALS — BP 112/78 | HR 69 | Temp 97.9°F | Ht 67.0 in | Wt 168.0 lb

## 2015-12-20 DIAGNOSIS — J45901 Unspecified asthma with (acute) exacerbation: Secondary | ICD-10-CM

## 2015-12-20 DIAGNOSIS — J441 Chronic obstructive pulmonary disease with (acute) exacerbation: Secondary | ICD-10-CM

## 2015-12-20 DIAGNOSIS — R062 Wheezing: Secondary | ICD-10-CM

## 2015-12-20 DIAGNOSIS — K4090 Unilateral inguinal hernia, without obstruction or gangrene, not specified as recurrent: Secondary | ICD-10-CM | POA: Diagnosis not present

## 2015-12-20 MED ORDER — BUDESONIDE-FORMOTEROL FUMARATE 160-4.5 MCG/ACT IN AERO: 2.0000 | INHALATION_SPRAY | Freq: Two times a day (BID) | RESPIRATORY_TRACT | 12 refills | Status: DC

## 2015-12-20 MED ORDER — METHYLPREDNISOLONE ACETATE 80 MG/ML IJ SUSP
80.0000 mg | Freq: Once | INTRAMUSCULAR | Status: AC
  Administered 2015-12-20: 80 mg via INTRAMUSCULAR

## 2015-12-20 NOTE — Patient Instructions (Signed)
Hernia, Adult A hernia is the bulging of an organ or tissue through a weak spot in the muscles of the abdomen (abdominal wall). Hernias develop most often near the navel or groin. There are many kinds of hernias. Common kinds include:  Femoral hernia. This kind of hernia develops under the groin in the upper thigh area.  Inguinal hernia. This kind of hernia develops in the groin or scrotum.  Umbilical hernia. This kind of hernia develops near the navel.  Hiatal hernia. This kind of hernia causes part of the stomach to be pushed up into the chest.  Incisional hernia. This kind of hernia bulges through a scar from an abdominal surgery. What are the causes? This condition may be caused by:  Heavy lifting.  Coughing over a long period of time.  Straining to have a bowel movement.  An incision made during an abdominal surgery.  A birth defect (congenital defect).  Excess weight or obesity.  Smoking.  Poor nutrition.  Cystic fibrosis.  Excess fluid in the abdomen.  Undescended testicles. What are the signs or symptoms? Symptoms of a hernia include:  A lump on the abdomen. This is the first sign of a hernia. The lump may become more obvious with standing, straining, or coughing. It may get bigger over time if it is not treated or if the condition causing it is not treated.  Pain. A hernia is usually painless, but it may become painful over time if treatment is delayed. The pain is usually dull and may get worse with standing or lifting heavy objects. Sometimes a hernia gets tightly squeezed in the weak spot (strangulated) or stuck there (incarcerated) and causes additional symptoms. These symptoms may include:  Vomiting.  Nausea.  Constipation.  Irritability. How is this diagnosed? A hernia may be diagnosed with:  A physical exam. During the exam your health care provider may ask you to cough or to make a specific movement, because a hernia is usually more visible when  you move.  Imaging tests. These can include:  X-rays.  Ultrasound.  CT scan. How is this treated? A hernia that is small and painless may not need to be treated. A hernia that is large or painful may be treated with surgery. Inguinal hernias may be treated with surgery to prevent incarceration or strangulation. Strangulated hernias are always treated with surgery, because lack of blood to the trapped organ or tissue can cause it to die. Surgery to treat a hernia involves pushing the bulge back into place and repairing the weak part of the abdomen. Follow these instructions at home:  Avoid straining.  Do not lift anything heavier than 10 lb (4.5 kg).  Lift with your leg muscles, not your back muscles. This helps avoid strain.  When coughing, try to cough gently.  Prevent constipation. Constipation leads to straining with bowel movements, which can make a hernia worse or cause a hernia repair to break down. You can prevent constipation by:  Eating a high-fiber diet that includes plenty of fruits and vegetables.  Drinking enough fluids to keep your urine clear or pale yellow. Aim to drink 6-8 glasses of water per day.  Using a stool softener as directed by your health care provider.  Lose weight, if you are overweight.  Do not use any tobacco products, including cigarettes, chewing tobacco, or electronic cigarettes. If you need help quitting, ask your health care provider.  Keep all follow-up visits as directed by your health care provider. This is important. Your  health care provider may need to monitor your condition. Contact a health care provider if:  You have swelling, redness, and pain in the affected area.  Your bowel habits change. Get help right away if:  You have a fever.  You have abdominal pain that is getting worse.  You feel nauseous or you vomit.  You cannot push the hernia back in place by gently pressing on it while you are lying down.  The  hernia:  Changes in shape or size.  Is stuck outside the abdomen.  Becomes discolored.  Feels hard or tender. This information is not intended to replace advice given to you by your health care provider. Make sure you discuss any questions you have with your health care provider. Document Released: 01/14/2005 Document Revised: 06/14/2015 Document Reviewed: 11/24/2013 Elsevier Interactive Patient Education  2017 [employer]. Asthma, Adult Asthma is a recurring condition in which the airways tighten and narrow. Asthma can make it difficult to breathe. It can cause coughing, wheezing, and shortness of breath. Asthma episodes, also called asthma attacks, range from minor to life-threatening. Asthma cannot be cured, but medicines and lifestyle changes can help control it. What are the causes? Asthma is believed to be caused by inherited (genetic) and environmental factors, but its exact cause is unknown. Asthma may be triggered by allergens, lung infections, or irritants in the air. Asthma triggers are different for each person. Common triggers include:  Animal dander.  Dust mites.  Cockroaches.  Pollen from trees or grass.  Mold.  Smoke.  Air pollutants such as dust, household cleaners, hair sprays, aerosol sprays, paint fumes, strong chemicals, or strong odors.  Cold air, weather changes, and winds (which increase molds and pollens in the air).  Strong emotional expressions such as crying or laughing hard.  Stress.  Certain medicines (such as aspirin) or types of drugs (such as beta-blockers).  Sulfites in foods and drinks. Foods and drinks that may contain sulfites include dried fruit, potato chips, and sparkling grape juice.  Infections or inflammatory conditions such as the flu, a cold, or an inflammation of the nasal membranes (rhinitis).  Gastroesophageal reflux disease (GERD).  Exercise or strenuous activity. What are the signs or symptoms? Symptoms may occur  immediately after asthma is triggered or many hours later. Symptoms include:  Wheezing.  Excessive nighttime or early morning coughing.  Frequent or severe coughing with a common cold.  Chest tightness.  Shortness of breath. How is this diagnosed? The diagnosis of asthma is made by a review of your medical history and a physical exam. Tests may also be performed. These may include:  Lung function studies. These tests show how much air you breathe in and out.  Allergy tests.  Imaging tests such as X-rays. How is this treated? Asthma cannot be cured, but it can usually be controlled. Treatment involves identifying and avoiding your asthma triggers. It also involves medicines. There are 2 classes of medicine used for asthma treatment:  Controller medicines. These prevent asthma symptoms from occurring. They are usually taken every day.  Reliever or rescue medicines. These quickly relieve asthma symptoms. They are used as needed and provide short-term relief. Your health care provider will help you create an asthma action plan. An asthma action plan is a written plan for managing and treating your asthma attacks. It includes a list of your asthma triggers and how they may be avoided. It also includes information on when medicines should be taken and when their dosage should be changed.  An action plan may also involve the use of a device called a peak flow meter. A peak flow meter measures how well the lungs are working. It helps you monitor your condition. Follow these instructions at home:  Take medicines only as directed by your health care provider. Speak with your health care provider if you have questions about how or when to take the medicines.  Use a peak flow meter as directed by your health care provider. Record and keep track of readings.  Understand and use the action plan to help minimize or stop an asthma attack without needing to seek medical care.  Control your home  environment in the following ways to help prevent asthma attacks:  Do not smoke. Avoid being exposed to secondhand smoke.  Change your heating and air conditioning filter regularly.  Limit your use of fireplaces and wood stoves.  Get rid of pests (such as roaches and mice) and their droppings.  Throw away plants if you see mold on them.  Clean your floors and dust regularly. Use unscented cleaning products.  Try to have someone else vacuum for you regularly. Stay out of rooms while they are being vacuumed and for a short while afterward. If you vacuum, use a dust mask from a hardware store, a double-layered or microfilter vacuum cleaner bag, or a vacuum cleaner with a HEPA filter.  Replace carpet with wood, tile, or vinyl flooring. Carpet can trap dander and dust.  Use allergy-proof pillows, mattress covers, and box spring covers.  Wash bed sheets and blankets every week in hot water and dry them in a dryer.  Use blankets that are made of polyester or cotton.  Clean bathrooms and kitchens with bleach. If possible, have someone repaint the walls in these rooms with mold-resistant paint. Keep out of the rooms that are being cleaned and painted.  Wash hands frequently. Contact a health care provider if:  You have wheezing, shortness of breath, or a cough even if taking medicine to prevent attacks.  The colored mucus you cough up (sputum) is thicker than usual.  Your sputum changes from clear or white to yellow, green, gray, or bloody.  You have any problems that may be related to the medicines you are taking (such as a rash, itching, swelling, or trouble breathing).  You are using a reliever medicine more than 2-3 times per week.  Your peak flow is still at 50-79% of your personal best after following your action plan for 1 hour.  You have a fever. Get help right away if:  You seem to be getting worse and are unresponsive to treatment during an asthma attack.  You are short  of breath even at rest.  You get short of breath when doing very little physical activity.  You have difficulty eating, drinking, or talking due to asthma symptoms.  You develop chest pain.  You develop a fast heartbeat.  You have a bluish color to your lips or fingernails.  You are light-headed, dizzy, or faint.  Your peak flow is less than 50% of your personal best. This information is not intended to replace advice given to you by your health care provider. Make sure you discuss any questions you have with your health care provider. Document Released: 01/14/2005 Document Revised: 06/28/2015 Document Reviewed: 08/13/2012 Elsevier Interactive Patient Education  2017 [employer].

## 2015-12-20 NOTE — Progress Notes (Signed)
Subjective:    Patient ID: Blake Madden, male    DOB: 1945-08-06, 70 y.o.   MRN: [id number]  Wheezing   This is a recurrent problem. The current episode started 1 to 4 weeks ago. The problem occurs intermittently. The problem has been waxing and waning. Associated symptoms include coughing, rhinorrhea and shortness of breath. Pertinent negatives include no ear pain or fever. His past medical history is significant for asthma.  Groin Pain  Primary symptoms comment: bulging left groin. This is a new problem. The current episode started more than 1 month ago. The problem occurs constantly. The problem has been waxing and waning. The pain is mild. Associated symptoms include coughing and shortness of breath. Pertinent negatives include no fever. Exacerbated by: straining.  Asthma  He complains of cough, difficulty breathing, frequent throat clearing, hoarse voice, shortness of breath and wheezing. There is no chest tightness. This is a recurrent problem. The problem occurs intermittently. The problem has been waxing and waning. The cough is non-productive. Associated symptoms include malaise/fatigue, nasal congestion, orthopnea and rhinorrhea. Pertinent negatives include no ear congestion, ear pain or fever. His symptoms are aggravated by pollen, strenuous activity and any activity. His symptoms are alleviated by oral steroids and rest. He reports moderate improvement on treatment. His symptoms are not alleviated by ipratropium. His past medical history is significant for asthma.      Review of Systems  Constitutional: Positive for malaise/fatigue. Negative for fever.  HENT: Positive for hoarse voice and rhinorrhea. Negative for ear pain.   Respiratory: Positive for cough, shortness of breath and wheezing.   All other systems reviewed and are negative.      Objective:   Physical Exam  Constitutional: He is oriented to person, place, and time. He appears well-developed and well-nourished. No  distress.  HENT:  Head: Normocephalic.  Right Ear: External ear normal.  Left Ear: External ear normal.  Mouth/Throat: Oropharynx is clear and moist.  Eyes: Pupils are equal, round, and reactive to light. Right eye exhibits no discharge. Left eye exhibits no discharge.  Neck: Normal range of motion. Neck supple. No thyromegaly present.  Cardiovascular: Normal rate, regular rhythm, normal heart sounds and intact distal pulses.   No murmur heard. Pulmonary/Chest: Effort normal. No respiratory distress. He has wheezes (tight) in the right upper field, the right middle field, the right lower field, the left upper field, the left middle field and the left lower field.  Abdominal: Soft. Bowel sounds are normal. He exhibits no distension. There is no tenderness.  Genitourinary:  Genitourinary Comments: Left inguinal hernia   Musculoskeletal: Normal range of motion. He exhibits no edema or tenderness.  Neurological: He is alert and oriented to person, place, and time.  Skin: Skin is warm and dry. No rash noted. No erythema.  Psychiatric: He has a normal mood and affect. His behavior is normal. Judgment and thought content normal.  Vitals reviewed.     BP 112/78   Pulse 69   Temp 97.9 F (36.6 C) (Oral)   Ht 5\' 7"  (1.702 m)   Wt 168 lb (76.2 kg)   BMI 26.31 kg/m      Assessment & Plan:  1. Asthma, chronic obstructive, with acute exacerbation ([place]) -Pt started on Symbicort today! Discussed importance of taking every day!!!! -Avoid allergens - budesonide-formoterol (SYMBICORT) 160-4.5 MCG/ACT inhaler; Inhale 2 puffs into the lungs 2 (two) times daily.  Dispense: 1 Inhaler; Refill: 12 - methylPREDNISolone acetate (DEPO-MEDROL) injection 80 mg; Inject 1 mL (  80 mg total) into the muscle once.  2. Wheezing  - budesonide-formoterol (SYMBICORT) 160-4.5 MCG/ACT inhaler; Inhale 2 puffs into the lungs 2 (two) times daily.  Dispense: 1 Inhaler; Refill: 12 - methylPREDNISolone acetate  (DEPO-MEDROL) injection 80 mg; Inject 1 mL (80 mg total) into the muscle once.  3. Non-recurrent unilateral inguinal hernia without obstruction or gangrene -DO not life anything heavy or strain -If pain worsens or becomes discolored go to ED - Ambulatory referral to [place], FNP

## 2015-12-25 ENCOUNTER — Other Ambulatory Visit: Payer: Self-pay | Admitting: Family Medicine

## 2015-12-25 MED ORDER — PREDNISONE 10 MG (21) PO TBPK: ORAL_TABLET | ORAL | 0 refills | Status: DC

## 2015-12-25 NOTE — Telephone Encounter (Signed)
Prescription sent to pharmacy.

## 2015-12-25 NOTE — Telephone Encounter (Signed)
Wanda aware

## 2016-01-09 ENCOUNTER — Other Ambulatory Visit: Payer: Self-pay | Admitting: Surgery

## 2016-01-31 ENCOUNTER — Encounter (HOSPITAL_COMMUNITY): Payer: Self-pay

## 2016-01-31 NOTE — Pre-Procedure Instructions (Addendum)
    Blake Madden  01/31/2016      [facility] - [address], [place] [address] Phone: [phone] Fax: [phone]    Your procedure is scheduled on Wed., Jan. 10  Report to [facility] Admitting at 8:00 A.M.  Call this number if you have problems the morning of surgery:  [phone]   Remember:  Do not eat food or drink liquids after midnight on Tues. Jan. 9   Take these medicines the morning of surgery with A SIP OF WATER : proair if needed-bring to hospital, symbicort-bring to hospital, metoprolol (lopressor), omeprazole (prilosec), effexor              Stop aspirin, ibuprofen, motrin, aleve (naproxen sodium),BC Powders,goody's, vitamins/Herbal medicines   Do not wear jewelry.  Do not wear lotions, powders, or cologne, or deoderant.  Do not shave 48 hours prior to surgery.  Men may shave face and neck.  Do not bring valuables to the hospital.  [facility] is not responsible for any belongings or valuables.  Contacts, dentures or bridgework may not be worn into surgery.  Leave your suitcase in the car.  After surgery it may be brought to your room.  For patients admitted to the hospital, discharge time will be determined by your treatment team.  Patients discharged the day of surgery will not be allowed to drive home.   Name and phone number of your driver:    Special instructions:  Review preparing for surgery handout  Please read over the following fact sheets that you were given. Coughing and Deep Breathing

## 2016-02-01 ENCOUNTER — Encounter (HOSPITAL_COMMUNITY)
Admission: RE | Admit: 2016-02-01 | Discharge: 2016-02-01 | Disposition: A | Discharge status: Home / Self Care | Payer: Medicare Other | Source: Ambulatory Visit | Attending: Surgery | Admitting: Surgery

## 2016-02-01 ENCOUNTER — Encounter (HOSPITAL_COMMUNITY): Payer: Self-pay

## 2016-02-01 DIAGNOSIS — Z01812 Encounter for preprocedural laboratory examination: Secondary | ICD-10-CM | POA: Insufficient documentation

## 2016-02-01 DIAGNOSIS — K4090 Unilateral inguinal hernia, without obstruction or gangrene, not specified as recurrent: Secondary | ICD-10-CM | POA: Diagnosis not present

## 2016-02-01 DIAGNOSIS — K429 Umbilical hernia without obstruction or gangrene: Secondary | ICD-10-CM | POA: Insufficient documentation

## 2016-02-01 HISTORY — DX: Unspecified asthma, uncomplicated: J45.909

## 2016-02-01 LAB — BASIC METABOLIC PANEL
ANION GAP: 7 (ref 5–15)
BUN: 13 mg/dL (ref 6–20)
CALCIUM: 9.1 mg/dL (ref 8.9–10.3)
CHLORIDE: 105 mmol/L (ref 101–111)
CO2: 26 mmol/L (ref 22–32)
Creatinine, Ser: 0.79 mg/dL (ref 0.61–1.24)
GFR calc Af Amer: >60 mL/min (ref >60)
GFR calc non Af Amer: >60 mL/min (ref >60)
Glucose, Bld: 104 mg/dL — ABNORMAL HIGH (ref 65–99)
Potassium: 4 mmol/L (ref 3.5–5.1)
Sodium: 138 mmol/L (ref 135–145)

## 2016-02-01 LAB — CBC
HCT: 44.1 % (ref 39.0–52.0)
HEMOGLOBIN: 15.2 g/dL (ref 13.0–17.0)
MCH: 31.1 pg (ref 26.0–34.0)
MCHC: 34.5 g/dL (ref 30.0–36.0)
MCV: 90.4 fL (ref 78.0–100.0)
Platelets: 308 10*3/uL (ref 150–400)
RBC: 4.88 MIL/uL (ref 4.22–5.81)
RDW: 13.1 % (ref 11.5–15.5)
WBC: 11 10*3/uL — ABNORMAL HIGH (ref 4.0–10.5)

## 2016-02-01 NOTE — Progress Notes (Signed)
PCP: Dr. Redge Gainer, Evelina Dun FNP @ [facility]

## 2016-02-06 NOTE — H&P (Addendum)
Blake Madden 01/09/2016 11:02 AM Location: [facility] Patient #: [id number] DOB: Dec 30, 1945 Married / Language: English / Race: White Male   History of Present Illness (Shaquile Lutze A. Ninfa Linden MD; 01/09/2016 11:43 AM) Patient words: New-Inguinal hernia.  The patient is a 71 year old male who presents for an evaluation of a hernia. This is a pleasant patient referred to me by Evelina Dun FNP for evaluation for evaluation of a left inguinal hernia and umbilical hernia. The patient reports that he was lifting something heavy in August and suddenly noticed bulge and pull in the left groin. He reports that since then, he has had some mild discomfort but that the hernia always reduces easily. He also reports having a umbilical hernia which is had for many years. He has had some discomfort in his right groin as well. He is otherwise without complaints.   Past Surgical History Malachi Bonds, CMA; 01/09/2016 11:02 AM) Knee Surgery  Bilateral. Vasectomy   Diagnostic Studies History Malachi Bonds, CMA; 01/09/2016 11:02 AM) Colonoscopy  within last year  Allergies Malachi Bonds, CMA; 01/09/2016 11:03 AM) No Known Drug Allergies 01/09/2016  Medication History (Chemira Jones, CMA; 01/09/2016 11:05 AM) Zolpidem Tartrate (10MG  Tablet, Oral) Active. Metoprolol Tartrate (50MG  Tablet, Oral) Active. Montelukast Sodium (10MG  Tablet, Oral) Active. Omeprazole (40MG  Capsule DR, Oral) Active. Symbicort (160-4.5MCG/ACT Aerosol, Inhalation) Active. Venlafaxine HCl ER (150MG  Capsule ER 24HR, Oral) Active. ProAir HFA (108 (90 Base)MCG/ACT Aerosol Soln, Inhalation) Active. Naproxen Sodium (220MG  Capsule, Oral) Active. QUEtiapine Fumarate (200MG  Tablet, Oral) Active. Medications Reconciled  Social History Malachi Bonds, CMA; 01/09/2016 11:02 AM) Caffeine use  Carbonated beverages, Coffee. No drug use  Tobacco use  Never smoker.  Family History Malachi Bonds, CMA;  01/09/2016 11:02 AM) Bleeding disorder  Mother. Cancer  Father. Colon Cancer  Brother, Sister. Depression  Mother. Hypertension  Mother. Migraine Headache  Daughter.  Other Problems Malachi Bonds, CMA; 01/09/2016 11:02 AM) Depression  Gastroesophageal Reflux Disease  Hypercholesterolemia  Inguinal Hernia  Umbilical Hernia Repair     Review of Systems (Chemira Jones CMA; 01/09/2016 11:02 AM) General Not Present- Appetite Loss, Chills, Fatigue, Fever, Night Sweats, Weight Gain and Weight Loss. Skin Not Present- Change in Wart/Mole, Dryness, Hives, Jaundice, New Lesions, Non-Healing Wounds, Rash and Ulcer. HEENT Present- Seasonal Allergies. Not Present- Earache, Hearing Loss, Hoarseness, Nose Bleed, Oral Ulcers, Ringing in the Ears, Sinus Pain, Sore Throat, Visual Disturbances, Wears glasses/contact lenses and Yellow Eyes. Breast Not Present- Breast Mass, Breast Pain, Nipple Discharge and Skin Changes. Cardiovascular Not Present- Chest Pain, Difficulty Breathing Lying Down, Leg Cramps, Palpitations, Rapid Heart Rate, Shortness of Breath and Swelling of Extremities. Gastrointestinal Not Present- Abdominal Pain, Bloating, Bloody Stool, Change in Bowel Habits, Chronic diarrhea, Constipation, Difficulty Swallowing, Excessive gas, Gets full quickly at meals, Hemorrhoids, Indigestion, Nausea, Rectal Pain and Vomiting. Male Genitourinary Not Present- Blood in Urine, Change in Urinary Stream, Frequency, Impotence, Nocturia, Painful Urination, Urgency and Urine Leakage. Musculoskeletal Not Present- Back Pain, Joint Pain, Joint Stiffness, Muscle Pain, Muscle Weakness and Swelling of Extremities. Neurological Present- Headaches. Not Present- Decreased Memory, Fainting, Numbness, Seizures, Tingling, Tremor, Trouble walking and Weakness. Psychiatric Present- Depression. Not Present- Anxiety, Bipolar, Change in Sleep Pattern, Fearful and Frequent crying. Endocrine Not Present- Cold  Intolerance, Excessive Hunger, Hair Changes, Heat Intolerance, Hot flashes and New Diabetes. Hematology Not Present- Blood Thinners, Easy Bruising, Excessive bleeding, Gland problems, HIV and Persistent Infections.  Vitals (Chemira Jones CMA; 01/09/2016 11:03 AM) 01/09/2016 11:03 AM Weight: 173.8 lb Height: 67in Body Surface  Area: 1.9 m Body Mass Index: 27.22 kg/m  Temp.: 98.77F(Oral)  Pulse: 70 (Regular)  BP: 120/80 (Sitting, Left Arm, Standard)       Physical Exam (Asako Saliba A. Ninfa Linden MD; 01/09/2016 11:43 AM) General Mental Status-Alert. General Appearance-Consistent with stated age. Hydration-Well hydrated. Voice-Normal.  Head and Neck Head-normocephalic, atraumatic with no lesions or palpable masses. Trachea-midline.  Eye Eyeball - Bilateral-Extraocular movements intact. Sclera/Conjunctiva - Bilateral-No scleral icterus.  Chest and Lung Exam Chest and lung exam reveals -quiet, even and easy respiratory effort with no use of accessory muscles and on auscultation, normal breath sounds, no adventitious sounds and normal vocal resonance. Inspection Chest Wall - Normal. Back - normal.  Cardiovascular Cardiovascular examination reveals -normal heart sounds, regular rate and rhythm with no murmurs and normal pedal pulses bilaterally.  Abdomen Inspection Skin - Scar - no surgical scars. Hernias - Umbilical hernia -  Inguinal hernia - Left - Reducible. Right - Reducible. Palpation/Percussion Palpation and Percussion of the abdomen reveal - Soft, Non Tender, No Rebound tenderness, No Rigidity (guarding) and No hepatosplenomegaly. Auscultation Auscultation of the abdomen reveals - Bowel sounds normal.  Neurologic - Did not examine.  Musculoskeletal - Did not examine.    Assessment & Plan (Marqual Mi A. Ninfa Linden MD; 01/09/2016 11:45 AM) BILATERAL INGUINAL HERNIA (K40.20) Impression: This is a gentleman with bilateral inguinal hernias. The  left is much larger. There is just a weak area with tenderness in the right groin. He also has a very small umbilical hernia with chronically incarcerated omentum. I discussed the diagnosis with the patient and his wife. Repair of the hernias with mesh was recommended. I explained both the laparoscopic and open techniques. I would recommend bilateral laparoscopic inguinal hernia repair with mesh and then an open hernia repair with possible mesh at the umbilicus at the end of the surgery. I discussed the risk of surgery which includes but is not limited to bleeding, infection, injury to surrounding structures, nerve entrapment, chronic pain, need for further surgery, hernia recurrence, postoperative recovery, etc. They understand and agree to proceed with surgery UMBILICAL HERNIA ([id number])

## 2016-02-07 ENCOUNTER — Ambulatory Visit (HOSPITAL_COMMUNITY): Payer: Medicare Other | Admitting: Critical Care Medicine

## 2016-02-07 ENCOUNTER — Encounter (HOSPITAL_COMMUNITY): Payer: Self-pay | Admitting: *Deleted

## 2016-02-07 ENCOUNTER — Ambulatory Visit (HOSPITAL_COMMUNITY): Payer: Medicare Other | Admitting: Emergency Medicine

## 2016-02-07 ENCOUNTER — Encounter (HOSPITAL_COMMUNITY)
Admission: RE | Disposition: A | Discharge status: Home / Self Care | Payer: Self-pay | Source: Ambulatory Visit | Attending: Surgery

## 2016-02-07 ENCOUNTER — Ambulatory Visit (HOSPITAL_COMMUNITY)
Admission: RE | Admit: 2016-02-07 | Discharge: 2016-02-07 | Disposition: A | Discharge status: Home / Self Care | Payer: Medicare Other | Source: Ambulatory Visit | Attending: Surgery | Admitting: Surgery

## 2016-02-07 DIAGNOSIS — I10 Essential (primary) hypertension: Secondary | ICD-10-CM | POA: Diagnosis not present

## 2016-02-07 DIAGNOSIS — K4020 Bilateral inguinal hernia, without obstruction or gangrene, not specified as recurrent: Secondary | ICD-10-CM | POA: Insufficient documentation

## 2016-02-07 DIAGNOSIS — F329 Major depressive disorder, single episode, unspecified: Secondary | ICD-10-CM | POA: Diagnosis not present

## 2016-02-07 DIAGNOSIS — K429 Umbilical hernia without obstruction or gangrene: Secondary | ICD-10-CM | POA: Insufficient documentation

## 2016-02-07 HISTORY — PX: INGUINAL HERNIA REPAIR: SHX194

## 2016-02-07 HISTORY — PX: INSERTION OF MESH: SHX5868

## 2016-02-07 SURGERY — REPAIR, HERNIA, INGUINAL, BILATERAL, LAPAROSCOPIC
Anesthesia: General | Site: Groin | Laterality: Bilateral

## 2016-02-07 MED ORDER — SUGAMMADEX SODIUM 200 MG/2ML IV SOLN
INTRAVENOUS | Status: DC | PRN
  Administered 2016-02-07: 150 mg via INTRAVENOUS

## 2016-02-07 MED ORDER — FENTANYL CITRATE (PF) 100 MCG/2ML IJ SOLN
50.0000 ug | Freq: Once | INTRAMUSCULAR | Status: AC
  Administered 2016-02-07: 50 ug via INTRAVENOUS

## 2016-02-07 MED ORDER — MORPHINE SULFATE (PF) 2 MG/ML IV SOLN: 1.0000 mg | INTRAVENOUS | Status: DC | PRN

## 2016-02-07 MED ORDER — SODIUM CHLORIDE 0.9% FLUSH: 3.0000 mL | Freq: Two times a day (BID) | INTRAVENOUS | Status: DC

## 2016-02-07 MED ORDER — CEFAZOLIN SODIUM-DEXTROSE 2-4 GM/100ML-% IV SOLN
2.0000 g | INTRAVENOUS | Status: AC
  Administered 2016-02-07: 2 g via INTRAVENOUS

## 2016-02-07 MED ORDER — ACETAMINOPHEN 325 MG PO TABS
650.0000 mg | ORAL_TABLET | ORAL | Status: DC | PRN
  Administered 2016-02-07: 650 mg via ORAL

## 2016-02-07 MED ORDER — SUCCINYLCHOLINE CHLORIDE 200 MG/10ML IV SOSY
PREFILLED_SYRINGE | INTRAVENOUS | Status: AC
  Filled 2016-02-07: qty 10

## 2016-02-07 MED ORDER — OXYCODONE HCL 5 MG PO TABS
ORAL_TABLET | ORAL | Status: AC
  Filled 2016-02-07: qty 2

## 2016-02-07 MED ORDER — HYDROMORPHONE HCL 1 MG/ML IJ SOLN
0.2500 mg | INTRAMUSCULAR | Status: DC | PRN
  Administered 2016-02-07 (×4): 0.5 mg via INTRAVENOUS

## 2016-02-07 MED ORDER — PROPOFOL 10 MG/ML IV BOLUS
INTRAVENOUS | Status: DC | PRN
  Administered 2016-02-07: 150 mg via INTRAVENOUS

## 2016-02-07 MED ORDER — LIDOCAINE HCL (CARDIAC) 20 MG/ML IV SOLN
INTRAVENOUS | Status: DC | PRN
  Administered 2016-02-07: 60 mg via INTRAVENOUS

## 2016-02-07 MED ORDER — ACETAMINOPHEN 650 MG RE SUPP: 650.0000 mg | RECTAL | Status: DC | PRN

## 2016-02-07 MED ORDER — CHLORHEXIDINE GLUCONATE CLOTH 2 % EX PADS: 6.0000 | MEDICATED_PAD | Freq: Once | CUTANEOUS | Status: DC

## 2016-02-07 MED ORDER — FENTANYL CITRATE (PF) 100 MCG/2ML IJ SOLN
INTRAMUSCULAR | Status: DC | PRN
  Administered 2016-02-07: 100 ug via INTRAVENOUS

## 2016-02-07 MED ORDER — MIDAZOLAM HCL 5 MG/5ML IJ SOLN
INTRAMUSCULAR | Status: DC | PRN
  Administered 2016-02-07: 1 mg via INTRAVENOUS

## 2016-02-07 MED ORDER — ONDANSETRON HCL 4 MG/2ML IJ SOLN
INTRAMUSCULAR | Status: DC | PRN
  Administered 2016-02-07: 4 mg via INTRAVENOUS

## 2016-02-07 MED ORDER — HYDROCODONE-ACETAMINOPHEN 5-325 MG PO TABS: 1.0000 | ORAL_TABLET | ORAL | 0 refills | Status: DC | PRN

## 2016-02-07 MED ORDER — SUGAMMADEX SODIUM 200 MG/2ML IV SOLN
INTRAVENOUS | Status: AC
  Filled 2016-02-07: qty 2

## 2016-02-07 MED ORDER — ACETAMINOPHEN 325 MG PO TABS
ORAL_TABLET | ORAL | Status: AC
  Filled 2016-02-07: qty 2

## 2016-02-07 MED ORDER — DEXAMETHASONE SODIUM PHOSPHATE 10 MG/ML IJ SOLN
INTRAMUSCULAR | Status: AC
  Filled 2016-02-07: qty 1

## 2016-02-07 MED ORDER — SODIUM CHLORIDE 0.9 % IV SOLN: 250.0000 mL | INTRAVENOUS | Status: DC | PRN

## 2016-02-07 MED ORDER — ROCURONIUM BROMIDE 50 MG/5ML IV SOSY
PREFILLED_SYRINGE | INTRAVENOUS | Status: AC
  Filled 2016-02-07: qty 5

## 2016-02-07 MED ORDER — ROCURONIUM BROMIDE 100 MG/10ML IV SOLN
INTRAVENOUS | Status: DC | PRN
  Administered 2016-02-07: 30 mg via INTRAVENOUS

## 2016-02-07 MED ORDER — FENTANYL CITRATE (PF) 100 MCG/2ML IJ SOLN
INTRAMUSCULAR | Status: AC
  Filled 2016-02-07: qty 4

## 2016-02-07 MED ORDER — HYDROMORPHONE HCL 1 MG/ML IJ SOLN
INTRAMUSCULAR | Status: AC
  Administered 2016-02-07: 0.5 mg via INTRAVENOUS
  Filled 2016-02-07: qty 1

## 2016-02-07 MED ORDER — LIDOCAINE 2% (20 MG/ML) 5 ML SYRINGE
INTRAMUSCULAR | Status: AC
  Filled 2016-02-07: qty 5

## 2016-02-07 MED ORDER — OXYCODONE HCL 5 MG PO TABS
5.0000 mg | ORAL_TABLET | ORAL | Status: DC | PRN
  Administered 2016-02-07: 10 mg via ORAL

## 2016-02-07 MED ORDER — CEFAZOLIN SODIUM-DEXTROSE 2-4 GM/100ML-% IV SOLN
INTRAVENOUS | Status: AC
  Filled 2016-02-07: qty 100

## 2016-02-07 MED ORDER — ONDANSETRON HCL 4 MG/2ML IJ SOLN
INTRAMUSCULAR | Status: AC
  Filled 2016-02-07: qty 2

## 2016-02-07 MED ORDER — HYDROMORPHONE HCL 1 MG/ML IJ SOLN
INTRAMUSCULAR | Status: AC
  Administered 2016-02-07: 0.5 mg via INTRAVENOUS
  Filled 2016-02-07: qty 0.5

## 2016-02-07 MED ORDER — ONDANSETRON HCL 4 MG/2ML IJ SOLN: 4.0000 mg | Freq: Four times a day (QID) | INTRAMUSCULAR | Status: DC | PRN

## 2016-02-07 MED ORDER — MORPHINE SULFATE (PF) 4 MG/ML IV SOLN
INTRAVENOUS | Status: AC
  Administered 2016-02-07: 4 mg
  Filled 2016-02-07: qty 1

## 2016-02-07 MED ORDER — 0.9 % SODIUM CHLORIDE (POUR BTL) OPTIME
TOPICAL | Status: DC | PRN
  Administered 2016-02-07: 1000 mL

## 2016-02-07 MED ORDER — BUPIVACAINE HCL (PF) 0.25 % IJ SOLN
INTRAMUSCULAR | Status: DC | PRN
  Administered 2016-02-07: 20 mL

## 2016-02-07 MED ORDER — SODIUM CHLORIDE 0.9% FLUSH: 3.0000 mL | INTRAVENOUS | Status: DC | PRN

## 2016-02-07 MED ORDER — DEXAMETHASONE SODIUM PHOSPHATE 10 MG/ML IJ SOLN
INTRAMUSCULAR | Status: DC | PRN
  Administered 2016-02-07: 10 mg via INTRAVENOUS

## 2016-02-07 MED ORDER — MIDAZOLAM HCL 2 MG/2ML IJ SOLN
INTRAMUSCULAR | Status: AC
  Filled 2016-02-07: qty 2

## 2016-02-07 MED ORDER — BUPIVACAINE HCL (PF) 0.25 % IJ SOLN
INTRAMUSCULAR | Status: AC
  Filled 2016-02-07: qty 30

## 2016-02-07 MED ORDER — OXYCODONE HCL 5 MG PO TABS
5.0000 mg | ORAL_TABLET | Freq: Once | ORAL | Status: DC | PRN
Start: 2016-02-07 — End: 2016-02-07

## 2016-02-07 MED ORDER — OXYCODONE HCL 5 MG/5ML PO SOLN: 5.0000 mg | Freq: Once | ORAL | Status: DC | PRN

## 2016-02-07 MED ORDER — FENTANYL CITRATE (PF) 100 MCG/2ML IJ SOLN
INTRAMUSCULAR | Status: AC
  Administered 2016-02-07: 50 ug via INTRAVENOUS
  Filled 2016-02-07: qty 2

## 2016-02-07 MED ORDER — LACTATED RINGERS IV SOLN
INTRAVENOUS | Status: DC
  Administered 2016-02-07 (×3): via INTRAVENOUS

## 2016-02-07 MED ORDER — HYDROMORPHONE HCL 1 MG/ML IJ SOLN
INTRAMUSCULAR | Status: AC
  Filled 2016-02-07: qty 0.5

## 2016-02-07 SURGICAL SUPPLY — 35 items
APPLIER CLIP LOGIC TI 5 (MISCELLANEOUS) IMPLANT
BLADE SURG ROTATE 9660 (MISCELLANEOUS) IMPLANT
CANISTER SUCTION 2500CC (MISCELLANEOUS) IMPLANT
CHLORAPREP W/TINT 26ML (MISCELLANEOUS) ×3 IMPLANT
COVER SURGICAL LIGHT HANDLE (MISCELLANEOUS) ×3 IMPLANT
DERMABOND ADVANCED (GAUZE/BANDAGES/DRESSINGS) ×1
DERMABOND ADVANCED .7 DNX12 (GAUZE/BANDAGES/DRESSINGS) ×2 IMPLANT
DEVICE SECURE STRAP 25 ABSORB (INSTRUMENTS) ×3 IMPLANT
DISSECT BALLN SPACEMKR + OVL (BALLOONS) ×3
DISSECTOR BALLN SPACEMKR + OVL (BALLOONS) ×2 IMPLANT
DISSECTOR BLUNT TIP ENDO 5MM (MISCELLANEOUS) IMPLANT
DRAPE LAPAROSCOPIC ABDOMINAL (DRAPES) ×3 IMPLANT
ELECT REM PT RETURN 9FT ADLT (ELECTROSURGICAL) ×3
ELECTRODE REM PT RTRN 9FT ADLT (ELECTROSURGICAL) ×2 IMPLANT
GLOVE SURG SIGNA 7.5 PF LTX (GLOVE) ×3 IMPLANT
GOWN STRL REUS W/ TWL LRG LVL3 (GOWN DISPOSABLE) ×4 IMPLANT
GOWN STRL REUS W/ TWL XL LVL3 (GOWN DISPOSABLE) ×2 IMPLANT
GOWN STRL REUS W/TWL LRG LVL3 (GOWN DISPOSABLE) ×2
GOWN STRL REUS W/TWL XL LVL3 (GOWN DISPOSABLE) ×1
KIT BASIN OR (CUSTOM PROCEDURE TRAY) ×3 IMPLANT
KIT ROOM TURNOVER OR (KITS) ×3 IMPLANT
MESH 3DMAX 4X6 LT LRG (Mesh General) ×3 IMPLANT
MESH 3DMAX 4X6 RT LRG (Mesh General) ×3 IMPLANT
NEEDLE INSUFFLATION 14GA 120MM (NEEDLE) ×3 IMPLANT
NS IRRIG 1000ML POUR BTL (IV SOLUTION) ×3 IMPLANT
PAD ARMBOARD 7.5X6 YLW CONV (MISCELLANEOUS) ×3 IMPLANT
SET IRRIG TUBING LAPAROSCOPIC (IRRIGATION / IRRIGATOR) IMPLANT
SET TROCAR LAP APPLE-HUNT 5MM (ENDOMECHANICALS) ×3 IMPLANT
SUT MNCRL AB 4-0 PS2 18 (SUTURE) ×3 IMPLANT
SUT PROLENE 1 CT (SUTURE) ×3 IMPLANT
TOWEL OR 17X24 6PK STRL BLUE (TOWEL DISPOSABLE) ×3 IMPLANT
TOWEL OR 17X26 10 PK STRL BLUE (TOWEL DISPOSABLE) IMPLANT
TRAY FOLEY CATH 16FR SILVER (SET/KITS/TRAYS/PACK) IMPLANT
TRAY LAPAROSCOPIC MC (CUSTOM PROCEDURE TRAY) ×3 IMPLANT
TUBING INSUFFLATION (TUBING) ×3 IMPLANT

## 2016-02-07 NOTE — Interval H&P Note (Signed)
History and Physical Interval Note: no change in H and P  02/07/2016 8:00 AM  Blake Madden  has presented today for surgery, with the diagnosis of bilateral inguiinal and umbilical hernia  The various methods of treatment have been discussed with the patient and family. After consideration of risks, benefits and other options for treatment, the patient has consented to  Procedure(s): LAPAROSCOPIC BILATERAL INGUINAL HERNIA REPAIR WITH UMBILICIAL HERNIA (Bilateral) INSERTION OF MESH POSSIBLE FOR UMBILICAL HERNIA (Bilateral) as a surgical intervention .  The patient's history has been reviewed, patient examined, no change in status, stable for surgery.  I have reviewed the patient's chart and labs.  Questions were answered to the patient's satisfaction.     Blake Madden A

## 2016-02-07 NOTE — Anesthesia Procedure Notes (Addendum)
Procedure Name: Intubation Date/Time: 02/07/2016 9:39 AM Performed by: Rebekah Chesterfield L Pre-anesthesia Checklist: Patient identified, Emergency Drugs available, Suction available, Patient being monitored and Timeout performed Patient Re-evaluated:Patient Re-evaluated prior to inductionOxygen Delivery Method: Circle System Utilized Preoxygenation: Pre-oxygenation with 100% oxygen Intubation Type: IV induction Ventilation: Mask ventilation without difficulty Laryngoscope Size: Mac and 4 Grade View: Grade I Tube type: Oral Tube size: 7.5 mm Number of attempts: 1 Airway Equipment and Method: Stylet and Oral airway Placement Confirmation: ETT inserted through vocal cords under direct vision,  positive ETCO2 and breath sounds checked- equal and bilateral Secured at: 22 cm Tube secured with: Tape Dental Injury: Teeth and Oropharynx as per pre-operative assessment

## 2016-02-07 NOTE — Op Note (Signed)
LAPAROSCOPIC BILATERAL INGUINAL HERNIA REPAIR WITH UMBILICIAL HERNIA REPAIR, INSERTION OF MESH TO INGUINAL HERNIAS  Procedure Note  Blake Madden 02/07/2016   Pre-op Diagnosis: bilateral inguiinal and umbilical hernia     Post-op Diagnosis: same  Procedure(s): LAPAROSCOPIC BILATERAL INGUINAL HERNIA REPAIR WITH UMBILICIAL HERNIA REPAIR INSERTION OF MESH TO INGUINAL HERNIAS  Surgeon(s): Coralie Keens, MD  Anesthesia: General  Staff:  Circulator: Rosanne Sack, RN Scrub Person: Susann Givens, RN; Jim Like Leggio Circulator Assistant: Susann Givens, RN  Estimated Blood Loss: Minimal               Findings: The patient was found to have bilateral direct inguinal hernias as well as Madden small indirect left inguinal hernia and an umbilical hernia. The inguinal hernias were repaired with 2 separate pieces of hard 3-D max proline mesh. The small fascial defect at the umbilicus was repaired with Madden Prolene suture without mesh  Procedure: The patient was brought to the operating room and identified as the correct patient. He was placed upon the operating table and general anesthesia was induced. His abdomen was then prepped and draped in the usual sterile fashion. I made Madden small transverse incision at the lower edge of the umbilicus. I carried this down to the fascia which open just to the right of the midline. I elevated the rectus muscle and passed the balloon dissector underneath the rectus sheath and manipulated toward the pubis. The dissecting balloon was then insufflated under direct vision dissecting out the preperitoneal space. I then removed the dissecting balloon and insufflation was begun with carbon dioxide. I then placed 25 mm trochars in the patient's lower midline both under direct vision. The right inguinal area was dissected out first. He had Madden small direct hernia defect without evidence of indirect hernia. Next I dissected out the left inguinal area. He had Madden much  larger direct hernia defect on the right side. The sack had already been reduced. I evaluated the cord structures and found Madden small indirect hernia defect and was able to dissect the sac free from the cord as well. Next Madden left-sided piece of Bard 3-D max proline mesh brought to the field. I placed the trocar the umbilicus and opening it as an onlay on the left inguinal floor. I then tacked to Cooper's ligament, of the medial abdominal wall, and slightly laterally. Wide coverage of the direct defect and cord structures was achieved. I then brought Madden right-sided piece of Bard 3-D max proline mesh onto the field. It was placed through the vocal port is well-known open is only on the right inguinal floor. I then again tacked it to Cooper's ligament, the medial abdominal wall, slightly laterally widely covering the direct feedback as well.  At this point Y coverage of the defects appear to be achieved. Hemostasis also achieved. I removed the midline ports under direct vision and the preparedwas seen to collapse Madden properly with the mesh in place. I then removed the above report. The fascial defect at the umbilicus was less than 5 mm in size. I elected to close it primarily with Madden figure-of-eight #1 Prolene suture. I then closed the fascial incision below this with Madden figure-of-eight 0 Vicryl suture. All incisions were then anesthetized with Marcaine. I performed bilateral ilioinguinal nerve blocks of Marcaine as well. All skin incisions the close of 4-0 Monocryl and Dermabond. Patient tolerated procedure well. All counts were correct at the end of the procedure. The patient was then extubated in  the operating room and taken in Madden stable condition to the recovery.          Blake Madden   Date: 02/07/2016  Time: 10:21 AM

## 2016-02-07 NOTE — Anesthesia Postprocedure Evaluation (Signed)
Anesthesia Post Note  Patient: Blake Madden  Procedure(s) Performed: Procedure(s) (LRB): LAPAROSCOPIC BILATERAL INGUINAL HERNIA REPAIR WITH UMBILICIAL HERNIA REPAIR (Bilateral) INSERTION OF MESH TO INGUINAL HERNIAS (Bilateral)  Patient location during evaluation: PACU Anesthesia Type: General Level of consciousness: awake and alert and patient cooperative Pain management: pain level controlled Vital Signs Assessment: post-procedure vital signs reviewed and stable Respiratory status: spontaneous breathing and respiratory function stable Cardiovascular status: stable Anesthetic complications: no       Last Vitals:  Vitals:   02/07/16 1200 02/07/16 1215  BP:  102/77  Pulse: 80 83  Resp: (!) 21 18  Temp:      Last Pain:  Vitals:   02/07/16 1100  TempSrc:   PainSc: [place]

## 2016-02-07 NOTE — Transfer of Care (Signed)
Immediate Anesthesia Transfer of Care Note  Patient: Blake Madden  Procedure(s) Performed: Procedure(s): LAPAROSCOPIC BILATERAL INGUINAL HERNIA REPAIR WITH UMBILICIAL HERNIA REPAIR (Bilateral) INSERTION OF MESH TO INGUINAL HERNIAS (Bilateral)  Patient Location: PACU  Anesthesia Type:General  Level of Consciousness: awake, alert  and oriented  Airway & Oxygen Therapy: Patient Spontanous Breathing and Patient connected to nasal cannula oxygen  Post-op Assessment: Report given to RN, Post -op Vital signs reviewed and stable and Patient moving all extremities  Post vital signs: Reviewed and stable  Last Vitals:  Vitals:   02/07/16 0800 02/07/16 1025  BP: 105/74 125/85  Pulse: 67 76  Resp: 20 15  Temp: 37.2 C 37.1 C    Last Pain:  Vitals:   02/07/16 0800  TempSrc: Oral         Complications: No apparent anesthesia complications

## 2016-02-07 NOTE — Anesthesia Preprocedure Evaluation (Signed)
Anesthesia Evaluation  Patient identified by MRN, date of birth, ID band Patient awake    Reviewed: Allergy & Precautions, H&P , NPO status , Patient's Chart, lab work & pertinent test results  Airway Mallampati: II   Neck ROM: full    Dental   Pulmonary asthma ,    breath sounds clear to auscultation       Cardiovascular hypertension,  Rhythm:regular Rate:Normal     Neuro/Psych  Headaches, PSYCHIATRIC DISORDERS Depression    GI/Hepatic GERD  ,  Endo/Other    Renal/GU      Musculoskeletal   Abdominal   Peds  Hematology   Anesthesia Other Findings   Reproductive/Obstetrics                             Anesthesia Physical Anesthesia Plan  ASA: II  Anesthesia Plan: General   Post-op Pain Management:    Induction: Intravenous  Airway Management Planned: Oral ETT  Additional Equipment:   Intra-op Plan:   Post-operative Plan: Extubation in OR  Informed Consent: I have reviewed the patients History and Physical, chart, labs and discussed the procedure including the risks, benefits and alternatives for the proposed anesthesia with the patient or authorized representative who has indicated his/her understanding and acceptance.     Plan Discussed with: CRNA, Anesthesiologist and Surgeon  Anesthesia Plan Comments:         Anesthesia Quick Evaluation

## 2016-02-07 NOTE — Discharge Instructions (Signed)
[facility] _______[facility]  UMBILICAL OR INGUINAL HERNIA REPAIR: POST OP INSTRUCTIONS  Always review your discharge instruction sheet given to you by the facility where your surgery was performed. IF YOU HAVE DISABILITY OR FAMILY LEAVE FORMS, YOU MUST BRING THEM TO THE OFFICE FOR PROCESSING.   DO NOT GIVE THEM TO YOUR DOCTOR.  1. A  prescription for pain medication may be given to you upon discharge.  Take your pain medication as prescribed, if needed.  If narcotic pain medicine is not needed, then you may take acetaminophen (Tylenol) or ibuprofen (Advil) as needed. 2. Take your usually prescribed medications unless otherwise directed. If you need a refill on your pain medication, please contact your pharmacy.  They will contact our office to request authorization. Prescriptions will not be filled after 5 pm or on week-ends. 3. You should follow a light diet the first 24 hours after arrival home, such as soup and crackers, etc.  Be sure to include lots of fluids daily.  Resume your normal diet the day after surgery. 4.Most patients will experience some swelling and bruising around the umbilicus or in the groin and scrotum.  Ice packs and reclining will help.  Swelling and bruising can take several days to resolve.  6. It is common to experience some constipation if taking pain medication after surgery.  Increasing fluid intake and taking a stool softener (such as Colace) will usually help or prevent this problem from occurring.  A mild laxative (Milk of Magnesia or Miralax) should be taken according to package directions if there are no bowel movements after 48 hours. 7. Unless discharge instructions indicate otherwise, you may remove your bandages 24-48 hours after surgery, and you may shower at that time.  You may have steri-strips (small skin tapes) in place directly over the incision.  These strips should be left on the skin for 7-10 days.  If your surgeon used skin glue on the  incision, you may shower in 24 hours.  The glue will flake off over the next 2-3 weeks.  Any sutures or staples will be removed at the office during your follow-up visit. 8. ACTIVITIES:  You may resume regular (light) daily activities beginning the next day--such as daily self-care, walking, climbing stairs--gradually increasing activities as tolerated.  You may have sexual intercourse when it is comfortable.  Refrain from any heavy lifting or straining until approved by your doctor.  a.You may drive when you are no longer taking prescription pain medication, you can comfortably wear a seatbelt, and you can safely maneuver your car and apply brakes. b.RETURN TO WORK:   _____________________________________________  9.You should see your doctor in the office for a follow-up appointment approximately 2-3 weeks after your surgery.  Make sure that you call for this appointment within a day or two after you arrive home to insure a convenient appointment time. 10.OTHER INSTRUCTIONS: __NO LIFTING MORE THAN 15 POUNDS FOR 4 WEEKS OK TO SHOWER TOMORROW ICE PACK AND IBUPROFEN ALSO FOR PAIN_______________________    _____________________________________  WHEN TO CALL YOUR DOCTOR: 1. Fever over 101.0 2. Inability to urinate 3. Nausea and/or vomiting 4. Extreme swelling or bruising 5. Continued bleeding from incision. 6. Increased pain, redness, or drainage from the incision  The clinic staff is available to answer your questions during regular business hours.  Please dont hesitate to call and ask to speak to one of the nurses for clinical concerns.  If you have a medical emergency, go to the nearest emergency room  or call 911.  A surgeon from [facility] is always on call at the hospital   [address], [place], [place], [place]  [address] ?  [address], [place], [place]   [address] [phone] ? [phone] ? FAX [phone] Web site: [email or web address]

## 2016-02-08 ENCOUNTER — Encounter (HOSPITAL_COMMUNITY): Payer: Self-pay | Admitting: Surgery

## 2016-02-28 ENCOUNTER — Telehealth: Payer: Self-pay | Admitting: Family Medicine

## 2016-02-29 MED ORDER — FLUTICASONE-SALMETEROL 100-50 MCG/DOSE IN AEPB: 1.0000 | INHALATION_SPRAY | Freq: Two times a day (BID) | RESPIRATORY_TRACT | 3 refills | Status: DC

## 2016-02-29 NOTE — Telephone Encounter (Signed)
Which dose of Breo or advair would you like [place] to try and switch him to?

## 2016-02-29 NOTE — Telephone Encounter (Signed)
That would be a Brio 100/25 or Advair 100/50

## 2016-02-29 NOTE — Telephone Encounter (Signed)
Maybe ins will cover Breo or Advair

## 2016-02-29 NOTE — Telephone Encounter (Signed)
Pt notified of new RX 

## 2016-03-26 ENCOUNTER — Other Ambulatory Visit: Payer: Self-pay | Admitting: Family Medicine

## 2016-04-04 ENCOUNTER — Other Ambulatory Visit: Payer: Self-pay | Admitting: Family

## 2016-04-05 DIAGNOSIS — M25562 Pain in left knee: Secondary | ICD-10-CM | POA: Diagnosis not present

## 2016-04-05 DIAGNOSIS — M170 Bilateral primary osteoarthritis of knee: Secondary | ICD-10-CM | POA: Diagnosis not present

## 2016-04-05 DIAGNOSIS — M25561 Pain in right knee: Secondary | ICD-10-CM | POA: Diagnosis not present

## 2016-04-05 NOTE — Telephone Encounter (Signed)
Last filled 02/28/16, last seen 12/20/15. Route to pool for call in

## 2016-04-05 NOTE — Telephone Encounter (Signed)
rx called into pharmacy

## 2016-04-09 DIAGNOSIS — L579 Skin changes due to chronic exposure to nonionizing radiation, unspecified: Secondary | ICD-10-CM | POA: Diagnosis not present

## 2016-04-09 DIAGNOSIS — L814 Other melanin hyperpigmentation: Secondary | ICD-10-CM | POA: Diagnosis not present

## 2016-04-09 DIAGNOSIS — D225 Melanocytic nevi of trunk: Secondary | ICD-10-CM | POA: Diagnosis not present

## 2016-04-09 DIAGNOSIS — L820 Inflamed seborrheic keratosis: Secondary | ICD-10-CM | POA: Diagnosis not present

## 2016-04-09 DIAGNOSIS — L821 Other seborrheic keratosis: Secondary | ICD-10-CM | POA: Diagnosis not present

## 2016-04-09 DIAGNOSIS — L570 Actinic keratosis: Secondary | ICD-10-CM | POA: Diagnosis not present

## 2016-04-09 DIAGNOSIS — B079 Viral wart, unspecified: Secondary | ICD-10-CM | POA: Diagnosis not present

## 2016-05-13 DIAGNOSIS — M6208 Separation of muscle (nontraumatic), other site: Secondary | ICD-10-CM | POA: Diagnosis not present

## 2016-06-03 ENCOUNTER — Other Ambulatory Visit: Payer: Self-pay | Admitting: Family

## 2016-06-04 NOTE — Telephone Encounter (Signed)
Rx called in 

## 2016-06-04 NOTE — Telephone Encounter (Signed)
Last filled 05/05/16, last seen 12/20/15. Route to pool

## 2016-07-01 ENCOUNTER — Telehealth: Payer: Self-pay | Admitting: Family Medicine

## 2016-07-01 NOTE — Telephone Encounter (Signed)
Okay to refill all meds for 6 mos 

## 2016-07-02 ENCOUNTER — Other Ambulatory Visit: Payer: Self-pay | Admitting: *Deleted

## 2016-07-02 MED ORDER — QUETIAPINE FUMARATE 200 MG PO TABS: 200.0000 mg | ORAL_TABLET | Freq: Every day | ORAL | 0 refills | Status: DC

## 2016-07-02 NOTE — Telephone Encounter (Signed)
RX sent in per pt request Okayed per Dr Livia Snellen appt scheduled

## 2016-07-08 ENCOUNTER — Encounter: Payer: Self-pay | Admitting: Family

## 2016-07-08 ENCOUNTER — Ambulatory Visit (INDEPENDENT_AMBULATORY_CARE_PROVIDER_SITE_OTHER): Payer: Medicare Other | Admitting: Family

## 2016-07-08 VITALS — BP 114/81 | HR 82 | Temp 97.9°F | Ht 67.0 in | Wt 168.2 lb

## 2016-07-08 DIAGNOSIS — Z1159 Encounter for screening for other viral diseases: Secondary | ICD-10-CM

## 2016-07-08 DIAGNOSIS — J441 Chronic obstructive pulmonary disease with (acute) exacerbation: Secondary | ICD-10-CM

## 2016-07-08 DIAGNOSIS — K2100 Gastro-esophageal reflux disease with esophagitis, without bleeding: Secondary | ICD-10-CM

## 2016-07-08 DIAGNOSIS — J45901 Unspecified asthma with (acute) exacerbation: Secondary | ICD-10-CM

## 2016-07-08 DIAGNOSIS — I10 Essential (primary) hypertension: Secondary | ICD-10-CM

## 2016-07-08 DIAGNOSIS — Z23 Encounter for immunization: Secondary | ICD-10-CM

## 2016-07-08 DIAGNOSIS — F331 Major depressive disorder, recurrent, moderate: Secondary | ICD-10-CM | POA: Diagnosis not present

## 2016-07-08 DIAGNOSIS — E785 Hyperlipidemia, unspecified: Secondary | ICD-10-CM

## 2016-07-08 NOTE — Patient Instructions (Signed)
Fat and Cholesterol Restricted Diet High levels of fat and cholesterol in your blood may lead to various health problems, such as diseases of the heart, blood vessels, gallbladder, liver, and pancreas. Fats are concentrated sources of energy that come in various forms. Certain types of fat, including saturated fat, may be harmful in excess. Cholesterol is a substance needed by your body in small amounts. Your body makes all the cholesterol it needs. Excess cholesterol comes from the food you eat. When you have high levels of cholesterol and saturated fat in your blood, health problems can develop because the excess fat and cholesterol will gather along the walls of your blood vessels, causing them to narrow. Choosing the right foods will help you control your intake of fat and cholesterol. This will help keep the levels of these substances in your blood within normal limits and reduce your risk of disease. What is my plan? Your health care provider recommends that you:  Limit your fat intake to ______% or less of your total calories per day.  Limit the amount of cholesterol in your diet to less than _________mg per day.  Eat 20-30 grams of fiber each day.  What types of fat should I choose?  Choose healthy fats more often. Choose monounsaturated and polyunsaturated fats, such as olive and canola oil, flaxseeds, walnuts, almonds, and seeds.  Eat more omega-3 fats. Good choices include salmon, mackerel, sardines, tuna, flaxseed oil, and ground flaxseeds. Aim to eat fish at least two times a week.  Limit saturated fats. Saturated fats are primarily found in animal products, such as meats, butter, and cream. Plant sources of saturated fats include palm oil, palm kernel oil, and coconut oil.  Avoid foods with partially hydrogenated oils in them. These contain trans fats. Examples of foods that contain trans fats are stick margarine, some tub margarines, cookies, crackers, and other baked goods. What  general guidelines do I need to follow? These guidelines for healthy eating will help you control your intake of fat and cholesterol:  Check food labels carefully to identify foods with trans fats or high amounts of saturated fat.  Fill one half of your plate with vegetables and green salads.  Fill one fourth of your plate with whole grains. Look for the word "whole" as the first word in the ingredient list.  Fill one fourth of your plate with lean protein foods.  Limit fruit to two servings a day. Choose fruit instead of juice.  Eat more foods that contain fiber, such as apples, broccoli, carrots, beans, peas, and barley.  Eat more home-cooked food and less restaurant, buffet, and fast food.  Limit or avoid alcohol.  Limit foods high in starch and sugar.  Limit fried foods.  Cook foods using methods other than frying. Baking, boiling, grilling, and broiling are all great options.  Lose weight if you are overweight. Losing just 5-10% of your initial body weight can help your overall health and prevent diseases such as diabetes and heart disease.  What foods can I eat? Grains  Whole grains, such as whole wheat or whole grain breads, crackers, cereals, and pasta. Unsweetened oatmeal, bulgur, barley, quinoa, or brown rice. Corn or whole wheat flour tortillas. Vegetables  Fresh or frozen vegetables (raw, steamed, roasted, or grilled). Green salads. Fruits  All fresh, canned (in natural juice), or frozen fruits. Meats and other protein foods  Ground beef (85% or leaner), grass-fed beef, or beef trimmed of fat. Skinless chicken or turkey. Ground chicken or turkey.   Pork trimmed of fat. All fish and seafood. Eggs. Dried beans, peas, or lentils. Unsalted nuts or seeds. Unsalted canned or dry beans. Dairy  Low-fat dairy products, such as skim or 1% milk, 2% or reduced-fat cheeses, low-fat ricotta or cottage cheese, or plain low-fat yo Fats and oils  Tub margarines without trans  fats. Light or reduced-fat mayonnaise and salad dressings. Avocado. Olive, canola, sesame, or safflower oils. Natural peanut or almond butter (choose ones without added sugar and oil). The items listed above may not be a complete list of recommended foods or beverages. Contact your dietitian for more options. Foods to avoid Grains  White bread. White pasta. White rice. Cornbread. Bagels, pastries, and croissants. Crackers that contain trans fat. Vegetables  White potatoes. Corn. Creamed or fried vegetables. Vegetables in a cheese sauce. Fruits  Dried fruits. Canned fruit in light or heavy syrup. Fruit juice. Meats and other protein foods  Fatty cuts of meat. Ribs, chicken wings, bacon, sausage, bologna, salami, chitterlings, fatback, hot dogs, bratwurst, and packaged luncheon meats. Liver and organ meats. Dairy  Whole or 2% milk, cream, half-and-half, and cream cheese. Whole milk cheeses. Whole-fat or sweetened yogurt. Full-fat cheeses. Nondairy creamers and whipped toppings. Processed cheese, cheese spreads, or cheese curds. Beverages  Alcohol. Sweetened drinks (such as sodas, lemonade, and fruit drinks or punches). Fats and oils  Butter, stick margarine, lard, shortening, ghee, or bacon fat. Coconut, palm kernel, or palm oils. Sweets and desserts  Corn syrup, sugars, honey, and molasses. Candy. Jam and jelly. Syrup. Sweetened cereals. Cookies, pies, cakes, donuts, muffins, and ice cream. The items listed above may not be a complete list of foods and beverages to avoid. Contact your dietitian for more information. This information is not intended to replace advice given to you by your health care provider. Make sure you discuss any questions you have with your health care provider. Document Released: 01/14/2005 Document Revised: 02/04/2014 Document Reviewed: 04/14/2013 Elsevier Interactive Patient Education  2017 [employer] Inc.  

## 2016-07-08 NOTE — Addendum Note (Signed)
Addended by: Shelbie Ammons on: 07/08/2016 03:41 PM   Modules accepted: Orders

## 2016-07-08 NOTE — Progress Notes (Signed)
Subjective:    Patient ID: Blake Kinds., male    DOB: 07-Aug-1945, 71 y.o.   MRN: [id number]  Pt presents to the office today for chronic follow up. Pt had hernia repair in 01/18. Doing well.  Gastroesophageal Reflux  He complains of a hoarse voice and wheezing (If i exercise). He reports no belching, no coughing or no heartburn. This is a chronic problem. The current episode started more than 1 year ago. The problem occurs rarely. The problem has been resolved. He has tried a PPI for the symptoms. The treatment provided significant relief.  Depression         This is a chronic problem.  The current episode started more than 1 year ago.   The onset quality is gradual.   Associated symptoms include insomnia.  Associated symptoms include no helplessness, no hopelessness, not sad and no suicidal ideas.  Compliance with treatment is good. Hypertension  This is a chronic problem. The current episode started more than 1 year ago. The problem has been resolved since onset. The problem is controlled. Pertinent negatives include no blurred vision, malaise/fatigue, peripheral edema or shortness of breath. Risk factors for coronary artery disease include dyslipidemia, obesity and family history. The current treatment provides moderate improvement. There is no history of kidney disease, CAD/MI, CVA or heart failure.  Hyperlipidemia  This is a chronic problem. The current episode started more than 1 year ago. The problem is uncontrolled. Recent lipid tests were reviewed and are high. Exacerbating diseases include obesity. Pertinent negatives include no shortness of breath. Current antihyperlipidemic treatment includes diet change. Risk factors for coronary artery disease include dyslipidemia, family history, hypertension, male sex and post-menopausal.  Insomnia  Primary symptoms: difficulty falling asleep, no malaise/fatigue.  The current episode started more than one year. The onset quality is gradual. The  problem has been resolved since onset. PMH includes: depression.  Asthma  He complains of hoarse voice and wheezing (If i exercise). There is no cough, frequent throat clearing or shortness of breath. This is a chronic problem. The current episode started more than 1 year ago. The problem occurs intermittently. The problem has been waxing and waning. Pertinent negatives include no heartburn or malaise/fatigue. His past medical history is significant for asthma.      Review of Systems  Constitutional: Negative for malaise/fatigue.  HENT: Positive for hoarse voice.   Eyes: Negative for blurred vision.  Respiratory: Positive for wheezing (If i exercise). Negative for cough and shortness of breath.   Gastrointestinal: Negative for heartburn.  Psychiatric/Behavioral: Positive for depression. Negative for suicidal ideas. The patient has insomnia.   All other systems reviewed and are negative.      Objective:   Physical Exam  Constitutional: He is oriented to person, place, and time. He appears well-developed and well-nourished. No distress.  HENT:  Head: Normocephalic.  Right Ear: External ear normal.  Left Ear: External ear normal.  Nose: Nose normal.  Mouth/Throat: Oropharynx is clear and moist.  Eyes: Pupils are equal, round, and reactive to light. Right eye exhibits no discharge. Left eye exhibits no discharge.  Neck: Normal range of motion. Neck supple. No thyromegaly present.  Cardiovascular: Normal rate, regular rhythm, normal heart sounds and intact distal pulses.   No murmur heard. Pulmonary/Chest: Effort normal and breath sounds normal. No respiratory distress. He has no wheezes.  Abdominal: Soft. Bowel sounds are normal. He exhibits no distension. There is no tenderness.  Musculoskeletal: Normal range of motion. He exhibits  no edema or tenderness.  Neurological: He is alert and oriented to person, place, and time.  Skin: Skin is warm and dry. No rash noted. No erythema.    Psychiatric: He has a normal mood and affect. His behavior is normal. Judgment and thought content normal.  Vitals reviewed.     BP 114/81   Pulse 82   Temp 97.9 F (36.6 C) (Oral)   Ht 5' 7"  (1.702 m)   Wt 168 lb 3.2 oz (76.3 kg)   BMI 26.34 kg/m      Assessment & Plan:  1. Essential hypertension - CMP14+EGFR  2. Asthma, chronic obstructive, with acute exacerbation ([place]) - CMP14+EGFR  3. Gastroesophageal reflux disease with esophagitis  - CMP14+EGFR  4. Hyperlipidemia, unspecified hyperlipidemia type - CMP14+EGFR - Lipid panel  5. Moderate episode of recurrent major depressive disorder (HCC) - CMP14+EGFR  6. Need for hepatitis C screening test - CMP14+EGFR - Hepatitis C antibody   Continue all meds Labs pending Health Maintenance reviewed Diet and exercise encouraged RTO 6 months   Evelina Dun, FNP

## 2016-07-09 ENCOUNTER — Other Ambulatory Visit: Payer: Self-pay | Admitting: Family

## 2016-07-09 LAB — CMP14+EGFR
A/G RATIO: 1.7 (ref 1.2–2.2)
ALBUMIN: 4.2 g/dL (ref 3.5–4.8)
ALK PHOS: 82 IU/L (ref 39–117)
ALT: 17 IU/L (ref 0–44)
AST: 21 IU/L (ref 0–40)
BILIRUBIN TOTAL: 0.7 mg/dL (ref 0.0–1.2)
BUN/Creatinine Ratio: 8 — ABNORMAL LOW (ref 10–24)
BUN: 7 mg/dL — ABNORMAL LOW (ref 8–27)
CHLORIDE: 101 mmol/L (ref 96–106)
CO2: 24 mmol/L (ref 20–29)
Calcium: 9.5 mg/dL (ref 8.6–10.2)
Creatinine, Ser: 0.88 mg/dL (ref 0.76–1.27)
GFR calc non Af Amer: 86 mL/min/{1.73_m2} (ref >59)
GFR, EST AFRICAN AMERICAN: 100 mL/min/{1.73_m2} (ref >59)
GLOBULIN, TOTAL: 2.5 g/dL (ref 1.5–4.5)
GLUCOSE: 86 mg/dL (ref 65–99)
POTASSIUM: 4.5 mmol/L (ref 3.5–5.2)
SODIUM: 140 mmol/L (ref 134–144)
Total Protein: 6.7 g/dL (ref 6.0–8.5)

## 2016-07-09 LAB — LIPID PANEL
CHOLESTEROL TOTAL: 244 mg/dL — AB (ref 100–199)
Chol/HDL Ratio: 6.4 ratio — ABNORMAL HIGH (ref 0.0–5.0)
HDL: 38 mg/dL — ABNORMAL LOW (ref >39)
Triglycerides: 413 mg/dL — ABNORMAL HIGH (ref 0–149)

## 2016-07-09 LAB — HEPATITIS C ANTIBODY

## 2016-07-09 MED ORDER — ATORVASTATIN CALCIUM 20 MG PO TABS: 20.0000 mg | ORAL_TABLET | Freq: Every day | ORAL | 3 refills | Status: DC

## 2016-07-11 ENCOUNTER — Telehealth: Payer: Self-pay | Admitting: Family

## 2016-07-11 MED ORDER — OMEPRAZOLE 40 MG PO CPDR: 40.0000 mg | DELAYED_RELEASE_CAPSULE | Freq: Every day | ORAL | 0 refills | Status: DC

## 2016-07-11 MED ORDER — VENLAFAXINE HCL ER 150 MG PO CP24: ORAL_CAPSULE | ORAL | 0 refills | Status: DC

## 2016-07-11 MED ORDER — METOPROLOL TARTRATE 50 MG PO TABS: 50.0000 mg | ORAL_TABLET | Freq: Every day | ORAL | 0 refills | Status: DC

## 2016-07-11 NOTE — Telephone Encounter (Signed)
What is the name of the medication? Omeprazole, metoprolol, generic effexor,   Have you contacted your pharmacy to request a refill? yes  Which pharmacy would you like this sent to? [facility]   Patient notified that their request is being sent to the clinical staff for review and that they should receive a call once it is complete. If they do not receive a call within 24 hours they can check with their pharmacy or our office.

## 2016-07-11 NOTE — Telephone Encounter (Signed)
Closing encounter, addressed in another encounter

## 2016-07-11 NOTE — Telephone Encounter (Signed)
Refills sent to mail order  Labs released to MyChart Pt aware Also aware labs should also be release by the system 4 days after being resulted

## 2016-08-06 ENCOUNTER — Other Ambulatory Visit: Payer: Self-pay | Admitting: Family Medicine

## 2016-08-07 NOTE — Telephone Encounter (Signed)
Last seen 07/08/16  [facility]

## 2016-08-12 ENCOUNTER — Other Ambulatory Visit: Payer: Self-pay | Admitting: Family Medicine

## 2016-10-23 ENCOUNTER — Other Ambulatory Visit: Payer: Self-pay | Admitting: Family

## 2016-11-17 ENCOUNTER — Emergency Department (HOSPITAL_COMMUNITY): Payer: Medicare Other

## 2016-11-17 ENCOUNTER — Encounter (HOSPITAL_COMMUNITY): Payer: Self-pay | Admitting: *Deleted

## 2016-11-17 ENCOUNTER — Emergency Department (HOSPITAL_COMMUNITY)
Admission: EM | Admit: 2016-11-17 | Discharge: 2016-11-18 | Disposition: A | Discharge status: Home / Self Care | Payer: Medicare Other | Attending: Emergency Medicine | Admitting: Emergency Medicine

## 2016-11-17 DIAGNOSIS — R0600 Dyspnea, unspecified: Secondary | ICD-10-CM | POA: Diagnosis not present

## 2016-11-17 DIAGNOSIS — R002 Palpitations: Secondary | ICD-10-CM | POA: Diagnosis present

## 2016-11-17 DIAGNOSIS — I10 Essential (primary) hypertension: Secondary | ICD-10-CM | POA: Insufficient documentation

## 2016-11-17 DIAGNOSIS — J45909 Unspecified asthma, uncomplicated: Secondary | ICD-10-CM | POA: Diagnosis not present

## 2016-11-17 DIAGNOSIS — E876 Hypokalemia: Secondary | ICD-10-CM

## 2016-11-17 DIAGNOSIS — R0602 Shortness of breath: Secondary | ICD-10-CM | POA: Diagnosis not present

## 2016-11-17 DIAGNOSIS — Z049 Encounter for examination and observation for unspecified reason: Secondary | ICD-10-CM | POA: Diagnosis not present

## 2016-11-17 MED ORDER — ALBUTEROL SULFATE (2.5 MG/3ML) 0.083% IN NEBU
5.0000 mg | INHALATION_SOLUTION | Freq: Once | RESPIRATORY_TRACT | Status: AC
  Administered 2016-11-17: 5 mg via RESPIRATORY_TRACT
  Filled 2016-11-17: qty 6

## 2016-11-17 MED ORDER — NAPROXEN 250 MG PO TABS
500.0000 mg | ORAL_TABLET | Freq: Once | ORAL | Status: AC
  Administered 2016-11-17: 500 mg via ORAL
  Filled 2016-11-17: qty 2

## 2016-11-17 MED ORDER — SODIUM CHLORIDE 0.9 % IV BOLUS (SEPSIS)
1000.0000 mL | Freq: Once | INTRAVENOUS | Status: AC
  Administered 2016-11-17: 1000 mL via INTRAVENOUS

## 2016-11-17 NOTE — ED Notes (Signed)
Patient transported to X-ray 

## 2016-11-17 NOTE — ED Provider Notes (Signed)
Emergency Department Provider Note   I have reviewed the triage vital signs and the nursing notes.   HISTORY  Chief Complaint Shortness of Breath   HPI Blake Madden. is a 71 y.o. male with a history of asthma, depression, hypertension, hyperlipidemia presents to the emergency department today with palpitations.  Patient states that he laid down and started feeling his heart racing and then started again and lightheaded and had some shortness of breath.  This lasted for a few minutes.  Did not really have any chest pain nor did he syncopized.  He has never had any symptoms like this before.  No recent illnesses.  No history of coronary artery disease, atrial fibrillation or other associated cardiac issues.  No recent travels.  EMS was called his vital signs were reportedly normal.  He states that when he took his pulse when this was going on his oxygen saturation is 92% and his pulse was 125 range.    Does state that he has a cough but has not taken anything for it today. No alcohol, tobacco, drugs or increase in caffeine intake recently.   Past Medical History:  Diagnosis Date  . Allergic rhinitis   . Allergy   . Asthma   . BPH (benign prostatic hypertrophy)   . Depression   . GERD (gastroesophageal reflux disease)   . HTN (hypertension)   . Hyperlipemia   . Migraines     Patient Active Problem List   Diagnosis Date Noted  . BPH (benign prostatic hypertrophy)   . HTN (hypertension)   . Hyperlipemia   . Asbestos exposure 05/06/2014  . Asthma, chronic obstructive, with acute exacerbation ([place]) 07/17/2012  . GERD (gastroesophageal reflux disease) 05/08/2012  . Depression 05/08/2012    Past Surgical History:  Procedure Laterality Date  . INGUINAL HERNIA REPAIR Bilateral 02/07/2016   Procedure: LAPAROSCOPIC BILATERAL INGUINAL HERNIA REPAIR WITH UMBILICIAL HERNIA REPAIR;  Surgeon: Coralie Keens, MD;  Location: [place];  Service: General;  Laterality: Bilateral;  .  INSERTION OF MESH Bilateral 02/07/2016   Procedure: INSERTION OF MESH TO INGUINAL HERNIAS;  Surgeon: Coralie Keens, MD;  Location: [place];  Service: General;  Laterality: Bilateral;  . KNEE ARTHROSCOPY Left     Current Outpatient Rx  . Order #: [id number] Class: Normal  . Order #: [id number] Class: Print  . Order #: [id number] Class: Normal  . Order #: [id number] Class: Print  . Order #: [id number] Class: Print  . Order #: [id number] Class: Normal  . Order #: [id number] Class: Normal  . Order #: [id number] Class: Historical Med  . Order #: [id number] Class: Normal  . Order #: [id number] Class: Print  . Order #: [id number] Class: Print  . Order #: [id number] Class: Normal  . Order #: [id number] Class: Normal  . Order #: [id number] Class: Normal  . Order #: [id number] Class: Phone In    Allergies No known allergies  Family History  Problem Relation Age of Onset  . Lung cancer Father        lung  . Bladder Cancer Father   . Colon cancer Sister   . Colon cancer Brother     Social History Social History  Substance Use Topics  . Smoking status: Never Smoker  . Smokeless tobacco: Never Used  . Alcohol use No     Comment: rare    Review of Systems  All other systems negative except as documented in the HPI. All pertinent positives and negatives as reviewed in the HPI. ____________________________________________   PHYSICAL EXAM:  VITAL SIGNS: ED Triage Vitals  Enc Vitals Group     BP 11/17/16 2216 106/74     Pulse Rate 11/17/16 2216 82     Resp 11/17/16 2216 16     Temp 11/17/16 2216 97.7 F (36.5 C)     Temp Source 11/17/16 2216 Oral     SpO2 11/17/16 2216 96 %     Weight 11/17/16 2211 168 lb (76.2 kg)     Height 11/17/16 2211 5\' 7"  (1.702 m)     Head Circumference --      Peak Flow --      Pain Score 11/17/16 2303 6     Pain Loc --      Pain Edu? --      Excl. in [place]? --     Constitutional: Alert and oriented. Well appearing and in no acute distress. Eyes: Conjunctivae are  normal. PERRL. EOMI. Head: Atraumatic. Nose: No congestion/rhinnorhea. Mouth/Throat: Mucous membranes are moist.  Oropharynx non-erythematous. Neck: No stridor.  No meningeal signs.   Cardiovascular: Normal rate, regular rhythm. Good peripheral circulation. Grossly normal heart sounds.   Respiratory: Normal respiratory effort.  No retractions. Lungs mildly diminished with wheezing. Gastrointestinal: Soft and nontender. No distention.  Musculoskeletal: No lower extremity tenderness nor edema. No gross deformities of extremities. Neurologic:  Normal speech and language. No gross focal neurologic deficits are appreciated.  Skin:  Skin is warm, dry and intact. No rash noted.   ____________________________________________   LABS (all labs ordered are listed, but only abnormal results are displayed)  Labs Reviewed  CBC WITH DIFFERENTIAL/PLATELET - Abnormal; Notable for the following:       Result Value   Eosinophils Absolute 0.9 (*)    All other components within normal limits  COMPREHENSIVE METABOLIC PANEL - Abnormal; Notable for the following:    Potassium 3.2 (*)    Glucose, Bld 103 (*)    Calcium 8.5 (*)    Total Protein 6.1 (*)    Albumin 3.4 (*)    All other components within normal limits  TROPONIN I  D-DIMER, QUANTITATIVE (NOT AT [facility])  MAGNESIUM   ____________________________________________  EKG   EKG Interpretation  Date/Time:  Sunday November 17 2016 22:14:32 EDT Ventricular Rate:  82 PR Interval:    QRS Duration: 107 QT Interval:  368 QTC Calculation: 430 R Axis:   105 Text Interpretation:  Sinus rhythm Prolonged PR interval Right axis deviation Minimal ST elevation, inferior leads No old tracing to compare Confirmed by Merrily Pew [id number]) on 11/17/2016 10:20:31 PM       ____________________________________________  RADIOLOGY  Dg Chest 2 View  Result Date: 11/17/2016 CLINICAL DATA:  Acute onset of shortness of breath. Initial encounter. EXAM: CHEST   2 VIEW COMPARISON:  Chest radiograph performed 09/01/2015 FINDINGS: The lungs are well-aerated and clear. There is no evidence of focal opacification, pleural effusion or pneumothorax. The heart is normal in size; the mediastinal contour is within normal limits. No acute osseous abnormalities are seen. IMPRESSION: No acute cardiopulmonary process seen. Electronically Signed   By: Garald Balding M.D.   On: 11/17/2016 23:09    ____________________________________________   PROCEDURES  Procedure(s) performed:   Procedures   ____________________________________________   INITIAL IMPRESSION / ASSESSMENT AND PLAN / ED COURSE  Pertinent labs & imaging results that were available during my care of the patient were reviewed by me and considered in my medical decision making (see chart for details).  Will eval for cardiac causes. Could be svt vs paroxysmal Afib w/ RVR. Less likely  Vtach. Possibly related to albuterol use, less likely other etiologies.   Treated for bronchitis. Possibly anxiety? Also hypo-k, will ask to take K and follow up with PCP.  Started on asa in case of paroxysmal Afib, already on Metoprolol, will fu w/ cardiology.   ____________________________________________  FINAL CLINICAL IMPRESSION(S) / ED DIAGNOSES  Final diagnoses:  Dyspnea, unspecified type  Palpitations  Hypokalemia     MEDICATIONS GIVEN DURING THIS VISIT:  Medications  guaiFENesin (ROBITUSSIN) 100 MG/5ML solution 100 mg (100 mg Oral Given 11/18/16 0049)  albuterol (PROVENTIL) (2.5 MG/3ML) 0.083% nebulizer solution 5 mg (5 mg Nebulization Given 11/17/16 2224)  naproxen (NAPROSYN) tablet 500 mg (500 mg Oral Given 11/17/16 2303)  sodium chloride 0.9 % bolus 1,000 mL (0 mLs Intravenous Stopped 11/18/16 0035)  azithromycin (ZITHROMAX) tablet 500 mg (500 mg Oral Given 11/18/16 0049)  predniSONE (DELTASONE) tablet 60 mg (60 mg Oral Given 11/18/16 0050)  potassium chloride SA (K-DUR,KLOR-CON) CR tablet 40  mEq (40 mEq Oral Given 11/18/16 0049)     NEW OUTPATIENT MEDICATIONS STARTED DURING THIS VISIT:  Discharge Medication List as of 11/18/2016  1:05 AM    START taking these medications   Details  aspirin EC 325 MG tablet Take 1 tablet (325 mg total) by mouth daily., Starting Mon 11/18/2016, Print    azithromycin (ZITHROMAX) 250 MG tablet Take 1 tablet (250 mg total) by mouth daily. Take first 2 tablets together, then 1 every day until finished., Starting Mon 11/18/2016, Print    guaifenesin (ROBITUSSIN) 100 MG/5ML syrup Take 5-10 mLs (100-200 mg total) by mouth every 4 (four) hours as needed for cough., Starting Mon 11/18/2016, Print    potassium chloride SA (K-DUR,KLOR-CON) 20 MEQ tablet Take 2 tablets (40 mEq total) by mouth 2 (two) times daily., Starting Mon 11/18/2016, Print    predniSONE (DELTASONE) 20 MG tablet 2 tabs po daily x 4 days, Print        Note:  This document was prepared using Dragon voice recognition software and may include unintentional dictation errors.   Adwoa Axe, Corene Cornea, MD 11/18/16 [phone]

## 2016-11-17 NOTE — ED Notes (Signed)
Pt c/o headache-Dr Mesner aware

## 2016-11-17 NOTE — ED Triage Notes (Signed)
Pt c/o sob that started tonight x 2 hours ago; pt states when he lays down it feels like his heart is beating fast; pt denies any pain

## 2016-11-18 DIAGNOSIS — E876 Hypokalemia: Secondary | ICD-10-CM | POA: Diagnosis not present

## 2016-11-18 LAB — CBC WITH DIFFERENTIAL/PLATELET
Basophils Absolute: 0.1 10*3/uL (ref 0.0–0.1)
Basophils Relative: 1 %
EOS ABS: 0.9 10*3/uL — AB (ref 0.0–0.7)
EOS PCT: 10 %
HCT: 39.1 % (ref 39.0–52.0)
Hemoglobin: 13.4 g/dL (ref 13.0–17.0)
LYMPHS ABS: 3.1 10*3/uL (ref 0.7–4.0)
Lymphocytes Relative: 34 %
MCH: 31.3 pg (ref 26.0–34.0)
MCHC: 34.3 g/dL (ref 30.0–36.0)
MCV: 91.4 fL (ref 78.0–100.0)
MONOS PCT: 9 %
Monocytes Absolute: 0.8 10*3/uL (ref 0.1–1.0)
Neutro Abs: 4.1 10*3/uL (ref 1.7–7.7)
Neutrophils Relative %: 46 %
PLATELETS: 259 10*3/uL (ref 150–400)
RBC: 4.28 MIL/uL (ref 4.22–5.81)
RDW: 13 % (ref 11.5–15.5)
WBC: 8.9 10*3/uL (ref 4.0–10.5)

## 2016-11-18 LAB — COMPREHENSIVE METABOLIC PANEL
ALT: 23 U/L (ref 17–63)
ANION GAP: 7 (ref 5–15)
AST: 21 U/L (ref 15–41)
Albumin: 3.4 g/dL — ABNORMAL LOW (ref 3.5–5.0)
Alkaline Phosphatase: 78 U/L (ref 38–126)
BUN: 12 mg/dL (ref 6–20)
CALCIUM: 8.5 mg/dL — AB (ref 8.9–10.3)
CHLORIDE: 105 mmol/L (ref 101–111)
CO2: 27 mmol/L (ref 22–32)
Creatinine, Ser: 0.74 mg/dL (ref 0.61–1.24)
GFR calc non Af Amer: >60 mL/min (ref >60)
Glucose, Bld: 103 mg/dL — ABNORMAL HIGH (ref 65–99)
Potassium: 3.2 mmol/L — ABNORMAL LOW (ref 3.5–5.1)
SODIUM: 139 mmol/L (ref 135–145)
Total Bilirubin: 0.7 mg/dL (ref 0.3–1.2)
Total Protein: 6.1 g/dL — ABNORMAL LOW (ref 6.5–8.1)

## 2016-11-18 LAB — MAGNESIUM: MAGNESIUM: 1.8 mg/dL (ref 1.7–2.4)

## 2016-11-18 LAB — TROPONIN I: Troponin I: <0.03 ng/mL (ref <0.03)

## 2016-11-18 LAB — D-DIMER, QUANTITATIVE (NOT AT ARMC): D DIMER QUANT: 0.5 ug{FEU}/mL (ref 0.00–0.50)

## 2016-11-18 MED ORDER — AZITHROMYCIN 250 MG PO TABS
500.0000 mg | ORAL_TABLET | Freq: Once | ORAL | Status: AC
  Administered 2016-11-18: 500 mg via ORAL
  Filled 2016-11-18: qty 2

## 2016-11-18 MED ORDER — POTASSIUM CHLORIDE CRYS ER 20 MEQ PO TBCR
40.0000 meq | EXTENDED_RELEASE_TABLET | Freq: Once | ORAL | Status: AC
  Administered 2016-11-18: 40 meq via ORAL

## 2016-11-18 MED ORDER — POTASSIUM CHLORIDE CRYS ER 20 MEQ PO TBCR: 40.0000 meq | EXTENDED_RELEASE_TABLET | Freq: Two times a day (BID) | ORAL | 0 refills | Status: DC

## 2016-11-18 MED ORDER — AZITHROMYCIN 250 MG PO TABS: 250.0000 mg | ORAL_TABLET | Freq: Every day | ORAL | 0 refills | Status: DC

## 2016-11-18 MED ORDER — PREDNISONE 20 MG PO TABS: ORAL_TABLET | ORAL | 0 refills | Status: DC

## 2016-11-18 MED ORDER — GUAIFENESIN ER 600 MG PO TB12
600.0000 mg | ORAL_TABLET | Freq: Two times a day (BID) | ORAL | Status: DC
Start: 2016-11-18 — End: 2016-11-18

## 2016-11-18 MED ORDER — GUAIFENESIN 100 MG/5ML PO SYRP: 100.0000 mg | ORAL_SOLUTION | ORAL | 0 refills | Status: DC | PRN

## 2016-11-18 MED ORDER — ASPIRIN EC 325 MG PO TBEC: 325.0000 mg | DELAYED_RELEASE_TABLET | Freq: Every day | ORAL | 0 refills | Status: DC

## 2016-11-18 MED ORDER — GUAIFENESIN 100 MG/5ML PO SOLN
ORAL | Status: AC
  Administered 2016-11-18: 100 mg via ORAL
  Filled 2016-11-18: qty 5

## 2016-11-18 MED ORDER — POTASSIUM CHLORIDE CRYS ER 20 MEQ PO TBCR
EXTENDED_RELEASE_TABLET | ORAL | Status: AC
  Filled 2016-11-18: qty 2

## 2016-11-18 MED ORDER — PREDNISONE 50 MG PO TABS
60.0000 mg | ORAL_TABLET | Freq: Once | ORAL | Status: AC
  Administered 2016-11-18: 60 mg via ORAL
  Filled 2016-11-18: qty 1

## 2016-11-18 MED ORDER — GUAIFENESIN 100 MG/5ML PO SOLN
5.0000 mL | ORAL | Status: DC | PRN
  Administered 2016-11-18: 100 mg via ORAL

## 2016-11-18 NOTE — ED Notes (Signed)
Pt walked to bathroom and back to bed O2 sat at 93% and pt states he feels fine

## 2016-11-18 NOTE — ED Notes (Addendum)
Pt ambulated - O2 sats were 93- 95%, no SOB.

## 2016-11-29 ENCOUNTER — Other Ambulatory Visit: Payer: Self-pay | Admitting: Family

## 2016-11-29 MED ORDER — ZOLPIDEM TARTRATE 10 MG PO TABS: 10.0000 mg | ORAL_TABLET | Freq: Every day | ORAL | 1 refills | Status: DC

## 2016-11-29 NOTE — Telephone Encounter (Signed)
Last office visit June 11,2018. Requesting Ambein refill. Please advise.

## 2016-11-30 NOTE — Telephone Encounter (Signed)
Rx called in at [facility] in [place] per pt request and pt aware rx called in.

## 2016-12-21 DIAGNOSIS — M25562 Pain in left knee: Secondary | ICD-10-CM | POA: Diagnosis not present

## 2016-12-21 DIAGNOSIS — M25561 Pain in right knee: Secondary | ICD-10-CM | POA: Diagnosis not present

## 2016-12-21 DIAGNOSIS — M170 Bilateral primary osteoarthritis of knee: Secondary | ICD-10-CM | POA: Diagnosis not present

## 2016-12-26 DIAGNOSIS — L821 Other seborrheic keratosis: Secondary | ICD-10-CM | POA: Diagnosis not present

## 2016-12-26 DIAGNOSIS — D225 Melanocytic nevi of trunk: Secondary | ICD-10-CM | POA: Diagnosis not present

## 2016-12-26 DIAGNOSIS — L570 Actinic keratosis: Secondary | ICD-10-CM | POA: Diagnosis not present

## 2016-12-26 DIAGNOSIS — L820 Inflamed seborrheic keratosis: Secondary | ICD-10-CM | POA: Diagnosis not present

## 2016-12-26 DIAGNOSIS — L579 Skin changes due to chronic exposure to nonionizing radiation, unspecified: Secondary | ICD-10-CM | POA: Diagnosis not present

## 2016-12-26 DIAGNOSIS — L814 Other melanin hyperpigmentation: Secondary | ICD-10-CM | POA: Diagnosis not present

## 2017-01-12 ENCOUNTER — Other Ambulatory Visit: Payer: Self-pay | Admitting: Family Medicine

## 2017-01-15 NOTE — Telephone Encounter (Signed)
RF called to [facility]

## 2017-01-16 ENCOUNTER — Other Ambulatory Visit: Payer: Self-pay | Admitting: Family

## 2017-02-01 DIAGNOSIS — Z23 Encounter for immunization: Secondary | ICD-10-CM | POA: Diagnosis not present

## 2017-02-21 ENCOUNTER — Other Ambulatory Visit: Payer: Self-pay | Admitting: Family

## 2017-02-21 NOTE — Telephone Encounter (Signed)
Last seen 07/08/16  [facility]

## 2017-03-17 ENCOUNTER — Other Ambulatory Visit: Payer: Medicare Other

## 2017-03-17 DIAGNOSIS — J441 Chronic obstructive pulmonary disease with (acute) exacerbation: Secondary | ICD-10-CM

## 2017-03-17 DIAGNOSIS — J45901 Unspecified asthma with (acute) exacerbation: Secondary | ICD-10-CM

## 2017-03-17 DIAGNOSIS — N401 Enlarged prostate with lower urinary tract symptoms: Secondary | ICD-10-CM

## 2017-03-17 DIAGNOSIS — Z77090 Contact with and (suspected) exposure to asbestos: Secondary | ICD-10-CM

## 2017-03-17 DIAGNOSIS — E785 Hyperlipidemia, unspecified: Secondary | ICD-10-CM

## 2017-03-17 DIAGNOSIS — I10 Essential (primary) hypertension: Secondary | ICD-10-CM

## 2017-03-17 DIAGNOSIS — K2100 Gastro-esophageal reflux disease with esophagitis, without bleeding: Secondary | ICD-10-CM

## 2017-03-17 DIAGNOSIS — F331 Major depressive disorder, recurrent, moderate: Secondary | ICD-10-CM

## 2017-03-18 ENCOUNTER — Other Ambulatory Visit: Payer: Self-pay | Admitting: Family

## 2017-03-18 DIAGNOSIS — I10 Essential (primary) hypertension: Secondary | ICD-10-CM | POA: Diagnosis not present

## 2017-03-18 DIAGNOSIS — J45901 Unspecified asthma with (acute) exacerbation: Secondary | ICD-10-CM | POA: Diagnosis not present

## 2017-03-18 DIAGNOSIS — Z77090 Contact with and (suspected) exposure to asbestos: Secondary | ICD-10-CM | POA: Diagnosis not present

## 2017-03-18 DIAGNOSIS — F331 Major depressive disorder, recurrent, moderate: Secondary | ICD-10-CM | POA: Diagnosis not present

## 2017-03-18 DIAGNOSIS — K2100 Gastro-esophageal reflux disease with esophagitis, without bleeding: Secondary | ICD-10-CM

## 2017-03-18 DIAGNOSIS — E785 Hyperlipidemia, unspecified: Secondary | ICD-10-CM

## 2017-03-18 DIAGNOSIS — J441 Chronic obstructive pulmonary disease with (acute) exacerbation: Secondary | ICD-10-CM

## 2017-03-18 DIAGNOSIS — N401 Enlarged prostate with lower urinary tract symptoms: Secondary | ICD-10-CM | POA: Diagnosis not present

## 2017-03-18 NOTE — Progress Notes (Signed)
Orders placed for lab work, has appt 03/21/17

## 2017-03-19 LAB — CMP14+EGFR
A/G RATIO: 1.8 (ref 1.2–2.2)
ALBUMIN: 4.4 g/dL (ref 3.5–4.8)
ALT: 19 IU/L (ref 0–44)
AST: 16 IU/L (ref 0–40)
Alkaline Phosphatase: 79 IU/L (ref 39–117)
BUN/Creatinine Ratio: 13 (ref 10–24)
BUN: 11 mg/dL (ref 8–27)
Bilirubin Total: 0.7 mg/dL (ref 0.0–1.2)
CO2: 24 mmol/L (ref 20–29)
Calcium: 9.4 mg/dL (ref 8.6–10.2)
Chloride: 104 mmol/L (ref 96–106)
Creatinine, Ser: 0.83 mg/dL (ref 0.76–1.27)
GFR, EST AFRICAN AMERICAN: 102 mL/min/{1.73_m2} (ref >59)
GFR, EST NON AFRICAN AMERICAN: 88 mL/min/{1.73_m2} (ref >59)
GLOBULIN, TOTAL: 2.4 g/dL (ref 1.5–4.5)
Glucose: 90 mg/dL (ref 65–99)
POTASSIUM: 4.3 mmol/L (ref 3.5–5.2)
SODIUM: 145 mmol/L — AB (ref 134–144)
TOTAL PROTEIN: 6.8 g/dL (ref 6.0–8.5)

## 2017-03-19 LAB — CBC WITH DIFFERENTIAL/PLATELET
BASOS: 1 %
Basophils Absolute: 0.1 10*3/uL (ref 0.0–0.2)
EOS (ABSOLUTE): 1.4 10*3/uL — AB (ref 0.0–0.4)
Eos: 14 %
Hematocrit: 46.1 % (ref 37.5–51.0)
Hemoglobin: 15.4 g/dL (ref 13.0–17.7)
IMMATURE GRANS (ABS): 0.1 10*3/uL (ref 0.0–0.1)
Immature Granulocytes: 1 %
LYMPHS: 18 %
Lymphocytes Absolute: 1.8 10*3/uL (ref 0.7–3.1)
MCH: 30.6 pg (ref 26.6–33.0)
MCHC: 33.4 g/dL (ref 31.5–35.7)
MCV: 92 fL (ref 79–97)
Monocytes Absolute: 1.2 10*3/uL — ABNORMAL HIGH (ref 0.1–0.9)
Monocytes: 12 %
NEUTROS ABS: 5.6 10*3/uL (ref 1.4–7.0)
Neutrophils: 54 %
PLATELETS: 328 10*3/uL (ref 150–379)
RBC: 5.04 x10E6/uL (ref 4.14–5.80)
RDW: 13.9 % (ref 12.3–15.4)
WBC: 10.2 10*3/uL (ref 3.4–10.8)

## 2017-03-19 LAB — PSA, TOTAL AND FREE
PROSTATE SPECIFIC AG, SERUM: 2.8 ng/mL (ref 0.0–4.0)
PSA FREE: 0.49 ng/mL
PSA, Free Pct: 17.5 %

## 2017-03-19 LAB — LIPID PANEL
CHOLESTEROL TOTAL: 219 mg/dL — AB (ref 100–199)
Chol/HDL Ratio: 5.6 ratio — ABNORMAL HIGH (ref 0.0–5.0)
HDL: 39 mg/dL — ABNORMAL LOW (ref >39)
LDL Calculated: 139 mg/dL — ABNORMAL HIGH (ref 0–99)
TRIGLYCERIDES: 206 mg/dL — AB (ref 0–149)
VLDL Cholesterol Cal: 41 mg/dL — ABNORMAL HIGH (ref 5–40)

## 2017-03-20 ENCOUNTER — Other Ambulatory Visit: Payer: Self-pay | Admitting: Family

## 2017-03-20 MED ORDER — ATORVASTATIN CALCIUM 40 MG PO TABS: 40.0000 mg | ORAL_TABLET | Freq: Every day | ORAL | 1 refills | Status: DC

## 2017-03-21 ENCOUNTER — Ambulatory Visit (INDEPENDENT_AMBULATORY_CARE_PROVIDER_SITE_OTHER): Payer: Medicare Other | Admitting: Family

## 2017-03-21 ENCOUNTER — Encounter: Payer: Self-pay | Admitting: Family

## 2017-03-21 VITALS — BP 125/81 | HR 88 | Temp 98.4°F | Ht 67.0 in | Wt 174.6 lb

## 2017-03-21 DIAGNOSIS — J441 Chronic obstructive pulmonary disease with (acute) exacerbation: Secondary | ICD-10-CM

## 2017-03-21 DIAGNOSIS — E785 Hyperlipidemia, unspecified: Secondary | ICD-10-CM | POA: Diagnosis not present

## 2017-03-21 DIAGNOSIS — N401 Enlarged prostate with lower urinary tract symptoms: Secondary | ICD-10-CM

## 2017-03-21 DIAGNOSIS — F331 Major depressive disorder, recurrent, moderate: Secondary | ICD-10-CM | POA: Diagnosis not present

## 2017-03-21 DIAGNOSIS — K2100 Gastro-esophageal reflux disease with esophagitis, without bleeding: Secondary | ICD-10-CM

## 2017-03-21 DIAGNOSIS — J4541 Moderate persistent asthma with (acute) exacerbation: Secondary | ICD-10-CM | POA: Diagnosis not present

## 2017-03-21 DIAGNOSIS — I10 Essential (primary) hypertension: Secondary | ICD-10-CM | POA: Diagnosis not present

## 2017-03-21 DIAGNOSIS — J45901 Unspecified asthma with (acute) exacerbation: Secondary | ICD-10-CM

## 2017-03-21 MED ORDER — PREDNISONE 10 MG (21) PO TBPK: ORAL_TABLET | ORAL | 0 refills | Status: DC

## 2017-03-21 MED ORDER — BUDESONIDE-FORMOTEROL FUMARATE 160-4.5 MCG/ACT IN AERO: 2.0000 | INHALATION_SPRAY | Freq: Two times a day (BID) | RESPIRATORY_TRACT | 3 refills | Status: DC

## 2017-03-21 NOTE — Patient Instructions (Addendum)
Asthma, Acute Bronchospasm °Acute bronchospasm caused by asthma is also referred to as an asthma attack. Bronchospasm means your air passages become narrowed. The narrowing is caused by inflammation and tightening of the muscles in the air tubes (bronchi) in your lungs. This can make it hard to breathe or cause you to wheeze and cough. °What are the causes? °Possible triggers are: °· Animal dander from the skin, hair, or feathers of animals. °· Dust mites contained in house dust. °· Cockroaches. °· Pollen from trees or grass. °· Mold. °· Cigarette or tobacco smoke. °· Air pollutants such as dust, household cleaners, hair sprays, aerosol sprays, paint fumes, strong chemicals, or strong odors. °· Cold air or weather changes. Cold air may trigger inflammation. Winds increase molds and pollens in the air. °· Strong emotions such as crying or laughing hard. °· Stress. °· Certain medicines such as aspirin or beta-blockers. °· Sulfites in foods and drinks, such as dried fruits and wine. °· Infections or inflammatory conditions, such as a flu, cold, or inflammation of the nasal membranes (rhinitis). °· Gastroesophageal reflux disease (GERD). GERD is a condition where stomach acid backs up into your esophagus. °· Exercise or strenuous activity. ° °What are the signs or symptoms? °· Wheezing. °· Excessive coughing, particularly at night. °· Chest tightness. °· Shortness of breath. °How is this diagnosed? °Your health care provider will ask you about your medical history and perform a physical exam. A chest X-ray or blood testing may be performed to look for other causes of your symptoms or other conditions that may have triggered your asthma attack. °How is this treated? °Treatment is aimed at reducing inflammation and opening up the airways in your lungs. Most asthma attacks are treated with inhaled medicines. These include quick relief or rescue medicines (such as bronchodilators) and controller medicines (such as inhaled  corticosteroids). These medicines are sometimes given through an inhaler or a nebulizer. Systemic steroid medicine taken by mouth or given through an IV tube also can be used to reduce the inflammation when an attack is moderate or severe. Antibiotic medicines are only used if a bacterial infection is present. °Follow these instructions at home: °· Rest. °· Drink plenty of liquids. This helps the mucus to remain thin and be easily coughed up. Only use caffeine in moderation and do not use alcohol until you have recovered from your illness. °· Do not smoke. Avoid being exposed to secondhand smoke. °· You play a critical role in keeping yourself in good health. Avoid exposure to things that cause you to wheeze or to have breathing problems. °· Keep your medicines up-to-date and available. Carefully follow your health care provider’s treatment plan. °· Take your medicine exactly as prescribed. °· When pollen or pollution is bad, keep windows closed and use an air conditioner or go to places with air conditioning. °· Asthma requires careful medical care. See your health care provider for a follow-up as advised. If you are more than [redacted] weeks pregnant and you were prescribed any new medicines, let your obstetrician know about the visit and how you are doing. Follow up with your health care provider as directed. °· After you have recovered from your asthma attack, make an appointment with your outpatient doctor to talk about ways to reduce the likelihood of future attacks. If you do not have a doctor who manages your asthma, make an appointment with a primary care doctor to discuss your asthma. °Get help right away if: °· You are getting worse. °·   You have trouble breathing. If severe, call your local emergency services (911 in the [place] °· You develop chest pain or discomfort. °· You are vomiting. °· You are not able to keep fluids down. °· You are coughing up yellow, green, brown, or bloody sputum. °· You have a fever  and your symptoms suddenly get worse. °· You have trouble swallowing. °This information is not intended to replace advice given to you by your health care provider. Make sure you discuss any questions you have with your health care provider. °Document Released: 05/01/2006 Document Revised: 06/28/2015 Document Reviewed: 07/22/2012 °Elsevier Interactive Patient Education © 2017 [employer] Inc[employer] ° °

## 2017-03-21 NOTE — Progress Notes (Signed)
Subjective:    Patient ID: Blake Madden., male    DOB: 08-May-1945, 72 y.o.   MRN: [id number]  Pt presents to the office today for chronic follow up. Pt had hernia repair in 01/18. Doing well. Pt had his labs drawn prior to visit.  Depression         This is a chronic problem.  The current episode started more than 1 year ago.   The onset quality is gradual.   The problem occurs intermittently.  The problem has been waxing and waning since onset.  Associated symptoms include no helplessness, no hopelessness, not irritable, no headaches and not sad.  Past treatments include SSRIs - Selective serotonin reuptake inhibitors. Gastroesophageal Reflux  He complains of coughing, heartburn, a hoarse voice and wheezing. He reports no belching. This is a chronic problem. The current episode started more than 1 year ago. The problem occurs occasionally. The problem has been resolved. The symptoms are aggravated by certain foods. He has tried a PPI for the symptoms. The treatment provided moderate relief.  Asthma  He complains of cough, hoarse voice and wheezing. There is no shortness of breath. This is a chronic problem. The current episode started more than 1 year ago. The problem occurs intermittently. The problem has been waxing and waning. Associated symptoms include heartburn. Pertinent negatives include no headaches or malaise/fatigue. His symptoms are alleviated by rest and beta-agonist. His past medical history is significant for asthma.  Hyperlipidemia  This is a chronic problem. The current episode started more than 1 year ago. The problem is uncontrolled. Recent lipid tests were reviewed and are high. Pertinent negatives include no shortness of breath. Current antihyperlipidemic treatment includes statins. The current treatment provides moderate improvement of lipids. Risk factors for coronary artery disease include dyslipidemia, male sex and a sedentary lifestyle.  Hypertension  This is a chronic  problem. The current episode started more than 1 year ago. The problem has been resolved since onset. The problem is controlled. Pertinent negatives include no headaches, malaise/fatigue, peripheral edema or shortness of breath. Risk factors for coronary artery disease include dyslipidemia, diabetes mellitus, male gender and family history. The current treatment provides moderate improvement. There is no history of kidney disease, CAD/MI, CVA or heart failure.  Benign Prostatic Hypertrophy  This is a chronic problem. The current episode started more than 1 year ago. The problem has been resolved since onset. Irritative symptoms do not include frequency or nocturia. Obstructive symptoms do not include an intermittent stream or a slower stream.  Cough  This is a new problem. The current episode started 1 to 4 weeks ago. The problem has been unchanged. The problem occurs every few minutes. The cough is non-productive. Associated symptoms include heartburn and wheezing. Pertinent negatives include no headaches or shortness of breath. He has tried OTC cough suppressant and rest for the symptoms. The treatment provided no relief. His past medical history is significant for asthma.      Review of Systems  Constitutional: Negative for malaise/fatigue.  HENT: Positive for hoarse voice.   Respiratory: Positive for cough and wheezing. Negative for shortness of breath.   Gastrointestinal: Positive for heartburn.  Genitourinary: Negative for frequency and nocturia.  Neurological: Negative for headaches.  Psychiatric/Behavioral: Positive for depression.  All other systems reviewed and are negative.      Objective:   Physical Exam  Constitutional: He is oriented to person, place, and time. He appears well-developed and well-nourished. He is not irritable. No distress.  HENT:  Head: Normocephalic.  Right Ear: External ear normal.  Left Ear: External ear normal.  Nose: Mucosal edema and rhinorrhea  present.  Mouth/Throat: Posterior oropharyngeal erythema present.  Eyes: Pupils are equal, round, and reactive to light. Right eye exhibits no discharge. Left eye exhibits no discharge.  Neck: Normal range of motion. Neck supple. No thyromegaly present.  Cardiovascular: Normal rate, regular rhythm, normal heart sounds and intact distal pulses.  No murmur heard. Pulmonary/Chest: Effort normal. No respiratory distress. He has wheezes.  Abdominal: Soft. Bowel sounds are normal. He exhibits no distension. There is no tenderness.  Musculoskeletal: Normal range of motion. He exhibits no edema or tenderness.  Neurological: He is alert and oriented to person, place, and time. He has normal reflexes. No cranial nerve deficit.  Skin: Skin is warm and dry. No rash noted. No erythema.  Psychiatric: He has a normal mood and affect. His behavior is normal. Judgment and thought content normal.  Vitals reviewed.    BP 125/81   Pulse 88   Temp 98.4 F (36.9 C)   Ht 5\' 7"  (1.702 m)   Wt 174 lb 9.6 oz (79.2 kg)   BMI 27.35 kg/m      Assessment & Plan:  1. Essential hypertension  2. Asthma, chronic obstructive, with acute exacerbation ([place])  3. Gastroesophageal reflux disease with esophagitis  4. Benign prostatic hyperplasia with lower urinary tract symptoms, symptom details unspecified  5. Hyperlipidemia, unspecified hyperlipidemia type   6. Moderate episode of recurrent major depressive disorder ([place]  7. Moderate persistent asthma with acute exacerbation - predniSONE (STERAPRED UNI-PAK 21 TAB) 10 MG (21) TBPK tablet; Use as directed  Dispense: 21 tablet; Refill: 0 - budesonide-formoterol (SYMBICORT) 160-4.5 MCG/ACT inhaler; Inhale 2 puffs into the lungs 2 (two) times daily.  Dispense: 1 Inhaler; Refill: 3   Continue all meds Labs discussed Health Maintenance reviewed Diet and exercise encouraged RTO 6 months  Evelina Dun, FNP

## 2017-03-27 ENCOUNTER — Ambulatory Visit (INDEPENDENT_AMBULATORY_CARE_PROVIDER_SITE_OTHER): Payer: Medicare Other | Admitting: Family

## 2017-03-27 ENCOUNTER — Encounter: Payer: Self-pay | Admitting: Family

## 2017-03-27 ENCOUNTER — Ambulatory Visit (INDEPENDENT_AMBULATORY_CARE_PROVIDER_SITE_OTHER): Payer: Medicare Other

## 2017-03-27 VITALS — BP 135/81 | HR 62 | Temp 97.0°F | Ht 67.0 in | Wt 173.2 lb

## 2017-03-27 DIAGNOSIS — S46001A Unspecified injury of muscle(s) and tendon(s) of the rotator cuff of right shoulder, initial encounter: Secondary | ICD-10-CM | POA: Diagnosis not present

## 2017-03-27 DIAGNOSIS — M25511 Pain in right shoulder: Secondary | ICD-10-CM | POA: Diagnosis not present

## 2017-03-27 DIAGNOSIS — S4991XA Unspecified injury of right shoulder and upper arm, initial encounter: Secondary | ICD-10-CM | POA: Diagnosis not present

## 2017-03-27 NOTE — Patient Instructions (Signed)

## 2017-03-27 NOTE — Progress Notes (Signed)
   Subjective:    Patient ID: Blake Madden., male    DOB: 1945-07-27, 72 y.o.   MRN: [id number]  Fall  The accident occurred 2 days ago. The fall occurred while standing. He fell from a height of 3 to 5 ft. He landed on dirt. There was no blood loss. The point of impact was the right shoulder. The pain is present in the right shoulder. The pain is at a severity of 6/10. The pain is moderate. The symptoms are aggravated by movement. Pertinent negatives include no fever, headaches, loss of consciousness, numbness, tingling or visual change. He has tried rest and NSAID for the symptoms. The treatment provided mild relief.      Review of Systems  Constitutional: Negative for fever.  Neurological: Negative for tingling, loss of consciousness, numbness and headaches.  All other systems reviewed and are negative.      Objective:   Physical Exam  Constitutional: He is oriented to person, place, and time. He appears well-developed and well-nourished. No distress.  HENT:  Head: Normocephalic.  Right Ear: External ear normal.  Left Ear: External ear normal.  Mouth/Throat: Oropharynx is clear and moist.  Eyes: Pupils are equal, round, and reactive to light. Right eye exhibits no discharge. Left eye exhibits no discharge.  Neck: Normal range of motion. Neck supple. No thyromegaly present.  Cardiovascular: Normal rate, regular rhythm, normal heart sounds and intact distal pulses.  No murmur heard. Pulmonary/Chest: Effort normal and breath sounds normal. No respiratory distress. He has no wheezes.  Abdominal: Soft. Bowel sounds are normal. He exhibits no distension. There is no tenderness.  Musculoskeletal: He exhibits tenderness.  Pt unable to abduct shoulder greater than 45 degrees, has pain with extension. No pain with sitting still.   Neurological: He is alert and oriented to person, place, and time. He has normal reflexes. No cranial nerve deficit.  Skin: Skin is warm and dry. No rash  noted. No erythema.  Psychiatric: He has a normal mood and affect. His behavior is normal. Judgment and thought content normal.  Vitals reviewed.   X-ray- Negative, will order CT scan, Preliminary reading by Evelina Dun, FNP [facility]   BP 135/81   Pulse 62   Temp (!) 97 F (36.1 C) (Oral)   Ht 5\' 7"  (1.702 m)   Wt 173 lb 3.2 oz (78.6 kg)   BMI 27.13 kg/m       Assessment & Plan:  1. Acute pain of right shoulder - DG Shoulder Right; Future - CT SHOULDER RIGHT WO CONTRAST; Future  2. Injury of right rotator cuff, initial encounter   CT scan pending Continue prednisone  and motrin prn  Ice Rest     Evelina Dun, FNP

## 2017-03-28 ENCOUNTER — Other Ambulatory Visit: Payer: Self-pay | Admitting: Family

## 2017-04-03 ENCOUNTER — Ambulatory Visit (HOSPITAL_COMMUNITY): Payer: Medicare Other

## 2017-04-05 ENCOUNTER — Encounter: Payer: Self-pay | Admitting: Family

## 2017-04-05 DIAGNOSIS — J111 Influenza due to unidentified influenza virus with other respiratory manifestations: Secondary | ICD-10-CM | POA: Diagnosis not present

## 2017-04-07 ENCOUNTER — Other Ambulatory Visit: Payer: Self-pay | Admitting: Family

## 2017-04-11 ENCOUNTER — Ambulatory Visit (HOSPITAL_COMMUNITY)
Admission: RE | Admit: 2017-04-11 | Discharge: 2017-04-11 | Disposition: A | Discharge status: Home / Self Care | Payer: Medicare Other | Source: Ambulatory Visit | Attending: Family | Admitting: Family

## 2017-04-11 DIAGNOSIS — M25511 Pain in right shoulder: Secondary | ICD-10-CM | POA: Insufficient documentation

## 2017-04-11 DIAGNOSIS — M19011 Primary osteoarthritis, right shoulder: Secondary | ICD-10-CM | POA: Diagnosis not present

## 2017-05-07 DIAGNOSIS — M25561 Pain in right knee: Secondary | ICD-10-CM | POA: Diagnosis not present

## 2017-05-07 DIAGNOSIS — M25562 Pain in left knee: Secondary | ICD-10-CM | POA: Diagnosis not present

## 2017-05-07 DIAGNOSIS — M170 Bilateral primary osteoarthritis of knee: Secondary | ICD-10-CM | POA: Diagnosis not present

## 2017-05-15 DIAGNOSIS — M170 Bilateral primary osteoarthritis of knee: Secondary | ICD-10-CM | POA: Diagnosis not present

## 2017-05-19 ENCOUNTER — Encounter: Payer: Self-pay | Admitting: Family

## 2017-05-19 DIAGNOSIS — L814 Other melanin hyperpigmentation: Secondary | ICD-10-CM | POA: Diagnosis not present

## 2017-05-19 DIAGNOSIS — L570 Actinic keratosis: Secondary | ICD-10-CM | POA: Diagnosis not present

## 2017-05-19 DIAGNOSIS — L579 Skin changes due to chronic exposure to nonionizing radiation, unspecified: Secondary | ICD-10-CM | POA: Diagnosis not present

## 2017-05-19 DIAGNOSIS — D225 Melanocytic nevi of trunk: Secondary | ICD-10-CM | POA: Diagnosis not present

## 2017-05-19 DIAGNOSIS — L821 Other seborrheic keratosis: Secondary | ICD-10-CM | POA: Diagnosis not present

## 2017-05-29 ENCOUNTER — Other Ambulatory Visit: Payer: Self-pay | Admitting: Family

## 2017-05-29 DIAGNOSIS — M25561 Pain in right knee: Secondary | ICD-10-CM | POA: Diagnosis not present

## 2017-05-29 DIAGNOSIS — M25562 Pain in left knee: Secondary | ICD-10-CM | POA: Diagnosis not present

## 2017-05-29 DIAGNOSIS — M25661 Stiffness of right knee, not elsewhere classified: Secondary | ICD-10-CM | POA: Diagnosis not present

## 2017-05-29 DIAGNOSIS — M25662 Stiffness of left knee, not elsewhere classified: Secondary | ICD-10-CM | POA: Diagnosis not present

## 2017-05-30 ENCOUNTER — Other Ambulatory Visit: Payer: Self-pay | Admitting: Family

## 2017-05-30 NOTE — Telephone Encounter (Signed)
Last seen 03/27/17  Christy 

## 2017-06-04 ENCOUNTER — Other Ambulatory Visit: Payer: Self-pay | Admitting: Family

## 2017-06-10 DIAGNOSIS — I499 Cardiac arrhythmia, unspecified: Secondary | ICD-10-CM | POA: Diagnosis not present

## 2017-06-10 DIAGNOSIS — K219 Gastro-esophageal reflux disease without esophagitis: Secondary | ICD-10-CM | POA: Diagnosis not present

## 2017-06-10 DIAGNOSIS — E785 Hyperlipidemia, unspecified: Secondary | ICD-10-CM | POA: Diagnosis not present

## 2017-06-10 DIAGNOSIS — G8918 Other acute postprocedural pain: Secondary | ICD-10-CM | POA: Diagnosis not present

## 2017-06-10 DIAGNOSIS — Z96651 Presence of right artificial knee joint: Secondary | ICD-10-CM | POA: Diagnosis not present

## 2017-06-10 DIAGNOSIS — Z96653 Presence of artificial knee joint, bilateral: Secondary | ICD-10-CM | POA: Diagnosis not present

## 2017-06-10 DIAGNOSIS — Z471 Aftercare following joint replacement surgery: Secondary | ICD-10-CM | POA: Diagnosis not present

## 2017-06-10 DIAGNOSIS — M25561 Pain in right knee: Secondary | ICD-10-CM | POA: Diagnosis not present

## 2017-06-10 DIAGNOSIS — M25562 Pain in left knee: Secondary | ICD-10-CM | POA: Diagnosis not present

## 2017-06-10 DIAGNOSIS — F329 Major depressive disorder, single episode, unspecified: Secondary | ICD-10-CM | POA: Diagnosis not present

## 2017-06-10 DIAGNOSIS — J45909 Unspecified asthma, uncomplicated: Secondary | ICD-10-CM | POA: Diagnosis not present

## 2017-06-10 DIAGNOSIS — N400 Enlarged prostate without lower urinary tract symptoms: Secondary | ICD-10-CM | POA: Diagnosis present

## 2017-06-10 DIAGNOSIS — M170 Bilateral primary osteoarthritis of knee: Secondary | ICD-10-CM | POA: Diagnosis not present

## 2017-06-10 DIAGNOSIS — Z96652 Presence of left artificial knee joint: Secondary | ICD-10-CM | POA: Diagnosis not present

## 2017-06-13 DIAGNOSIS — J45909 Unspecified asthma, uncomplicated: Secondary | ICD-10-CM | POA: Diagnosis not present

## 2017-06-13 DIAGNOSIS — K219 Gastro-esophageal reflux disease without esophagitis: Secondary | ICD-10-CM | POA: Diagnosis not present

## 2017-06-13 DIAGNOSIS — Z96653 Presence of artificial knee joint, bilateral: Secondary | ICD-10-CM | POA: Diagnosis not present

## 2017-06-13 DIAGNOSIS — Z471 Aftercare following joint replacement surgery: Secondary | ICD-10-CM | POA: Diagnosis not present

## 2017-06-17 DIAGNOSIS — J45909 Unspecified asthma, uncomplicated: Secondary | ICD-10-CM | POA: Diagnosis not present

## 2017-06-17 DIAGNOSIS — Z471 Aftercare following joint replacement surgery: Secondary | ICD-10-CM | POA: Diagnosis not present

## 2017-06-17 DIAGNOSIS — Z96653 Presence of artificial knee joint, bilateral: Secondary | ICD-10-CM | POA: Diagnosis not present

## 2017-06-17 DIAGNOSIS — K219 Gastro-esophageal reflux disease without esophagitis: Secondary | ICD-10-CM | POA: Diagnosis not present

## 2017-06-19 DIAGNOSIS — Z471 Aftercare following joint replacement surgery: Secondary | ICD-10-CM | POA: Diagnosis not present

## 2017-06-19 DIAGNOSIS — J45909 Unspecified asthma, uncomplicated: Secondary | ICD-10-CM | POA: Diagnosis not present

## 2017-06-19 DIAGNOSIS — Z96653 Presence of artificial knee joint, bilateral: Secondary | ICD-10-CM | POA: Diagnosis not present

## 2017-06-19 DIAGNOSIS — K219 Gastro-esophageal reflux disease without esophagitis: Secondary | ICD-10-CM | POA: Diagnosis not present

## 2017-06-24 DIAGNOSIS — Z471 Aftercare following joint replacement surgery: Secondary | ICD-10-CM | POA: Diagnosis not present

## 2017-06-24 DIAGNOSIS — J45909 Unspecified asthma, uncomplicated: Secondary | ICD-10-CM | POA: Diagnosis not present

## 2017-06-24 DIAGNOSIS — K219 Gastro-esophageal reflux disease without esophagitis: Secondary | ICD-10-CM | POA: Diagnosis not present

## 2017-06-24 DIAGNOSIS — Z96653 Presence of artificial knee joint, bilateral: Secondary | ICD-10-CM | POA: Diagnosis not present

## 2017-06-25 DIAGNOSIS — Z96653 Presence of artificial knee joint, bilateral: Secondary | ICD-10-CM | POA: Diagnosis not present

## 2017-06-25 DIAGNOSIS — Z471 Aftercare following joint replacement surgery: Secondary | ICD-10-CM | POA: Diagnosis not present

## 2017-06-26 DIAGNOSIS — K219 Gastro-esophageal reflux disease without esophagitis: Secondary | ICD-10-CM | POA: Diagnosis not present

## 2017-06-26 DIAGNOSIS — Z471 Aftercare following joint replacement surgery: Secondary | ICD-10-CM | POA: Diagnosis not present

## 2017-06-26 DIAGNOSIS — J45909 Unspecified asthma, uncomplicated: Secondary | ICD-10-CM | POA: Diagnosis not present

## 2017-06-26 DIAGNOSIS — Z96653 Presence of artificial knee joint, bilateral: Secondary | ICD-10-CM | POA: Diagnosis not present

## 2017-07-01 ENCOUNTER — Ambulatory Visit: Payer: Medicare Other | Attending: Orthopaedic Surgery | Admitting: Physical Therapy

## 2017-07-01 ENCOUNTER — Encounter: Payer: Self-pay | Admitting: Physical Therapy

## 2017-07-01 DIAGNOSIS — M25661 Stiffness of right knee, not elsewhere classified: Secondary | ICD-10-CM | POA: Diagnosis not present

## 2017-07-01 DIAGNOSIS — M25561 Pain in right knee: Secondary | ICD-10-CM

## 2017-07-01 DIAGNOSIS — M25562 Pain in left knee: Secondary | ICD-10-CM | POA: Insufficient documentation

## 2017-07-01 DIAGNOSIS — M25662 Stiffness of left knee, not elsewhere classified: Secondary | ICD-10-CM | POA: Diagnosis not present

## 2017-07-01 NOTE — Therapy (Signed)
[facility] [place], [place], [address] Phone: [phone]   Fax:  [phone]  Physical Therapy Evaluation  Patient Details  Name: Blake Madden. MRN: [id number] Date of Birth: 21-Feb-1945 Referring Provider: Dalbert Garnet   Encounter Date: 07/01/2017  PT End of Session - 07/01/17 2045    Visit Number  1    Number of Visits  12    Date for PT Re-Evaluation  08/19/17    PT Start Time  1300    PT Stop Time  1346    PT Time Calculation (min)  46 min    Activity Tolerance  Patient tolerated treatment well    Behavior During Therapy  [facility] for tasks assessed/performed       Past Medical History:  Diagnosis Date  . Allergic rhinitis   . Allergy   . Asthma   . BPH (benign prostatic hypertrophy)   . Depression   . GERD (gastroesophageal reflux disease)   . HTN (hypertension)   . Hyperlipemia   . Migraines     Past Surgical History:  Procedure Laterality Date  . INGUINAL HERNIA REPAIR Bilateral 02/07/2016   Procedure: LAPAROSCOPIC BILATERAL INGUINAL HERNIA REPAIR WITH UMBILICIAL HERNIA REPAIR;  Surgeon: Coralie Keens, MD;  Location: [place];  Service: General;  Laterality: Bilateral;  . INSERTION OF MESH Bilateral 02/07/2016   Procedure: INSERTION OF MESH TO INGUINAL HERNIAS;  Surgeon: Coralie Keens, MD;  Location: [place];  Service: General;  Laterality: Bilateral;  . KNEE ARTHROSCOPY Left     There were no vitals filed for this visit.   Subjective Assessment - 07/01/17 1757    Subjective  Patient arrives to physical therapy with decreased bilateral knee range of motion secondary to bilateral knee replacements 06/10/17. Patient states he had physical therapy in the hospital and at home. Patient reports at worst pain is 2/10 with increased activity. Patient reports pain at best is 0/10 with rest. Patient reports intermittent stiffness in bilateral knees after sitting for greater than 30 minutes. Patient reports  being complaint with HEP provided by home therapist and is walking daily without an AD. Patient reports being independent with all ADLs. Patient's goals are to improve movement, improve strength, and return to work.    Patient is accompained by:  Family member Wife    Limitations  Sitting    Patient Stated Goals  improve ROM    Currently in Pain?  Yes    Pain Score  2     Pain Location  Knee    Pain Orientation  Right;Left    Pain Descriptors / Indicators  Sore    Pain Type  Surgical pain    Pain Onset  1 to 4 weeks ago    Pain Frequency  Intermittent         [facility] PT Assessment - 07/01/17 0001      Assessment   Medical Diagnosis  Chronic pain in both knee    Referring Provider  Dalbert Garnet    Onset Date/Surgical Date  06/10/17    Next MD Visit  07/24/17    Prior Therapy  yes, home care      Balance Screen   Has the patient fallen in the past 6 months  No    Has the patient had a decrease in activity level because of a fear of falling?   No    Is the patient reluctant to leave their home because of a fear of falling?  No      Home Film/video editor residence    Living Arrangements  Spouse/significant other    Type of [place]      Prior Function   Level of Independence  Independent      Observation/Other Assessments   Focus on Therapeutic Outcomes (FOTO)   42% limted      Observation/Other Assessments-Edema    Edema  Circumferential      ROM / Strength   AROM / PROM / Strength  AROM;PROM;Strength      AROM   Overall AROM   Deficits    AROM Assessment Site  Knee    Right/Left Knee  Right;Left    Right Knee Extension  7    Right Knee Flexion  110    Left Knee Extension  4    Left Knee Flexion  113      PROM   PROM Assessment Site  Knee    Right/Left Knee  Right;Left    Right Knee Extension  4    Right Knee Flexion  114    Left Knee Extension  3    Left Knee Flexion  115      Strength   Overall Strength  Within functional  limits for tasks performed    Strength Assessment Site  Knee    Right/Left Knee  Right;Left    Right Knee Flexion  4+/5    Right Knee Extension  4+/5    Left Knee Flexion  4+/5    Left Knee Extension  4+/5      Palpation   Patella mobility  3/6 WFL    Palpation comment  no remarkable areas of tenderness upon palpation.      Transfers   Transfers  Independent with all Transfers      Ambulation/Gait   Gait Pattern  Step-through pattern;Decreased stance time - left;Decreased hip/knee flexion - right;Decreased weight shift to right    Ambulation Surface  Level      Balance   Balance Assessed  -- (-) Romberg                Objective measurements completed on examination: See above findings.      [facility] Adult PT Treatment/Exercise - 07/01/17 0001      Modalities   Modalities  Electrical Stimulation;Vasopneumatic      Acupuncturist Stimulation Location  bilateral knees    Electrical Stimulation Action  pre-mod two channels    Electrical Stimulation Parameters  80-150 hz x15 min    Electrical Stimulation Goals  Pain      Vasopneumatic   Number Minutes Vasopneumatic   15 minutes    Vasopnuematic Location   Knee bilateral    Vasopneumatic Pressure  Low    Vasopneumatic Temperature   34               PT Short Term Goals - 07/01/17 2049      PT SHORT TERM GOAL #1   Title  Patient will improve bilateral knee AROM to 0 to normalize gait pattern.    Time  3    Period  Weeks    Status  New      PT SHORT TERM GOAL #2   Title  Patient will negotiate stairs with reciprocating gait pattern and less than 2/10 pain in bilateral knees.    Time  3    Status  New  PT Long Term Goals - 07/01/17 2054      PT LONG TERM GOAL #1   Title  Patient will be independent with advanced HEP    Time  6    Period  Weeks    Status  New      PT LONG TERM GOAL #2   Title  Patient will improve bilateral knee flexion AROM to 120+ degrees to  improve functional activities.    Time  6    Period  Weeks    Status  New      PT LONG TERM GOAL #3   Title  Patient will improve bilateral knee MMT to 5/5 in all planes to improve stability during functional activities.    Time  6    Period  Weeks    Status  New             Plan - 07/01/17 2045    Clinical Impression Statement  Patient is a 72 year old male who presents to physical therapy with decreased ROM and strength in bilateral knees secondary to knee replacements on 06/10/17. Patient noted with decreased sensation lateral to incision on right knee. Patient also noted with increased edema, right knee > left knee. Patient (-) Romberg. Patient's incisions are healing well with moderate scabbing and residual glue. Patient would benefit from skilled physical therapy to address deficits and return to PLOF.     Clinical Presentation  Stable    Clinical Decision Making  Low    Rehab Potential  Excellent    PT Frequency  2x / week    PT Duration  6 weeks    PT Treatment/Interventions  ADLs/Self Care Home Management;Cryotherapy;Electrical Stimulation;Therapeutic exercise;Balance training;Moist Heat;Dry needling;Manual techniques;Passive range of motion;Taping;Scar mobilization;Vasopneumatic Device;Gait training;Stair training;Neuromuscular re-education    PT Next Visit Plan  Nustep, knee PROM, modalities PRN    Consulted and Agree with Plan of Care  Patient       Patient will benefit from skilled therapeutic intervention in order to improve the following deficits and impairments:  Pain, Decreased range of motion, Decreased strength, Difficulty walking, Increased edema  Visit Diagnosis: Stiffness of right knee, not elsewhere classified  Acute pain of right knee  Stiffness of left knee, not elsewhere classified  Acute pain of left knee     Problem List Patient Active Problem List   Diagnosis Date Noted  . Benign prostatic hyperplasia   . HTN (hypertension)   .  Hyperlipemia   . Asbestos exposure 05/06/2014  . Asthma, chronic obstructive, with acute exacerbation ([place]) 07/17/2012  . GERD (gastroesophageal reflux disease) 05/08/2012  . Depression 05/08/2012    [name], PT, DPT 07/01/2017, 9:02 PM  [facility] [address] [place], [place], [address] Phone: [phone]   Fax:  [phone]  Name: Blake Madden. MRN: [id number] Date of Birth: 12-Oct-1945

## 2017-07-04 ENCOUNTER — Ambulatory Visit: Payer: Medicare Other | Admitting: Physical Therapy

## 2017-07-04 DIAGNOSIS — M25561 Pain in right knee: Secondary | ICD-10-CM

## 2017-07-04 DIAGNOSIS — M25562 Pain in left knee: Secondary | ICD-10-CM | POA: Diagnosis not present

## 2017-07-04 DIAGNOSIS — M25662 Stiffness of left knee, not elsewhere classified: Secondary | ICD-10-CM | POA: Diagnosis not present

## 2017-07-04 DIAGNOSIS — M25661 Stiffness of right knee, not elsewhere classified: Secondary | ICD-10-CM

## 2017-07-04 NOTE — Therapy (Signed)
[facility] [place], [place], [address] Phone: [phone]   Fax:  [phone]  Physical Therapy Treatment  Patient Details  Name: Blake Madden. MRN: [id number] Date of Birth: Oct 15, 1945 Referring Provider: Dalbert Garnet   Encounter Date: 07/04/2017  PT End of Session - 07/04/17 1055    Visit Number  2    Number of Visits  12    Date for PT Re-Evaluation  08/19/17    PT Start Time  1030    PT Stop Time  1131    PT Time Calculation (min)  61 min    Activity Tolerance  Patient tolerated treatment well    Behavior During Therapy  [facility] for tasks assessed/performed       Past Medical History:  Diagnosis Date  . Allergic rhinitis   . Allergy   . Asthma   . BPH (benign prostatic hypertrophy)   . Depression   . GERD (gastroesophageal reflux disease)   . HTN (hypertension)   . Hyperlipemia   . Migraines     Past Surgical History:  Procedure Laterality Date  . INGUINAL HERNIA REPAIR Bilateral 02/07/2016   Procedure: LAPAROSCOPIC BILATERAL INGUINAL HERNIA REPAIR WITH UMBILICIAL HERNIA REPAIR;  Surgeon: Coralie Keens, MD;  Location: [place];  Service: General;  Laterality: Bilateral;  . INSERTION OF MESH Bilateral 02/07/2016   Procedure: INSERTION OF MESH TO INGUINAL HERNIAS;  Surgeon: Coralie Keens, MD;  Location: [place];  Service: General;  Laterality: Bilateral;  . KNEE ARTHROSCOPY Left     There were no vitals filed for this visit.      [facility] PT Assessment - 07/04/17 0001      Assessment   Medical Diagnosis  Chronic pain in both knee    Next MD Visit  07/24/17    Prior Therapy  yes, home care                   [facility] Adult PT Treatment/Exercise - 07/04/17 0001      Exercises   Exercises  Knee/Hip      Knee/Hip Exercises: Stretches   Passive Hamstring Stretch  Both;1 rep;60 seconds      Knee/Hip Exercises: Aerobic   Stationary Bike  Level 1 x15 minutes      Knee/Hip Exercises:  Seated   Long Arc Quad  AROM;Both;3 sets;10 reps      Modalities   Modalities  Psychologist, educational Location  bilateral knees    Electrical Stimulation Action  pre mod, 2 channels    Electrical Stimulation Parameters  80-150 hz x15 min    Electrical Stimulation Goals  Pain      Vasopneumatic   Number Minutes Vasopneumatic   15 minutes    Vasopnuematic Location   Knee    Vasopneumatic Pressure  Low    Vasopneumatic Temperature   34      Manual Therapy   Manual Therapy  Passive ROM    Passive ROM  bilateral knee PROM into flexion and extension to improve ROM                PT Short Term Goals - 07/01/17 2049      PT SHORT TERM GOAL #1   Title  Patient will improve bilateral knee AROM to 0 to normalize gait pattern.    Time  3    Period  Weeks    Status  New  PT SHORT TERM GOAL #2   Title  Patient will negotiate stairs with reciprocating gait pattern and less than 2/10 pain in bilateral knees.    Time  3    Status  New        PT Long Term Goals - 07/01/17 2054      PT LONG TERM GOAL #1   Title  Patient will be independent with advanced HEP    Time  6    Period  Weeks    Status  New      PT LONG TERM GOAL #2   Title  Patient will improve bilateral knee flexion AROM to 120+ degrees to improve functional activities.    Time  6    Period  Weeks    Status  New      PT LONG TERM GOAL #3   Title  Patient will improve bilateral knee MMT to 5/5 in all planes to improve stability during functional activities.    Time  6    Period  Weeks    Status  New            Plan - 07/04/17 1228    Clinical Impression Statement  Patient was able to complete treatment well with minimal reports of pain. Patient demonstrated very good form with all exercises after demonstration. Patient noted with increased discomfort during PROM at end ranges. Normal response to modalities upon removal.      Clinical Presentation  Stable    Clinical Decision Making  Low    Rehab Potential  Excellent    PT Frequency  2x / week    PT Duration  6 weeks    PT Treatment/Interventions  ADLs/Self Care Home Management;Cryotherapy;Electrical Stimulation;Therapeutic exercise;Balance training;Moist Heat;Dry needling;Manual techniques;Passive range of motion;Taping;Scar mobilization;Vasopneumatic Device;Gait training;Stair training;Neuromuscular re-education    PT Next Visit Plan  Nustep, knee PROM, modalities PRN    Consulted and Agree with Plan of Care  Patient       Patient will benefit from skilled therapeutic intervention in order to improve the following deficits and impairments:  Pain, Decreased range of motion, Decreased strength, Difficulty walking, Increased edema  Visit Diagnosis: Stiffness of right knee, not elsewhere classified  Acute pain of right knee  Stiffness of left knee, not elsewhere classified  Acute pain of left knee     Problem List Patient Active Problem List   Diagnosis Date Noted  . Benign prostatic hyperplasia   . HTN (hypertension)   . Hyperlipemia   . Asbestos exposure 05/06/2014  . Asthma, chronic obstructive, with acute exacerbation ([place]) 07/17/2012  . GERD (gastroesophageal reflux disease) 05/08/2012  . Depression 05/08/2012    [name], PT, DPT 07/04/2017, 12:34 PM  [facility] [address] [place], [place], [address] Phone: [phone]   Fax:  [phone]  Name: Blake Madden. MRN: [id number] Date of Birth: 1945-06-06

## 2017-07-08 ENCOUNTER — Encounter: Payer: Self-pay | Admitting: Physical Therapy

## 2017-07-08 ENCOUNTER — Ambulatory Visit: Payer: Medicare Other | Admitting: Physical Therapy

## 2017-07-08 DIAGNOSIS — M25662 Stiffness of left knee, not elsewhere classified: Secondary | ICD-10-CM

## 2017-07-08 DIAGNOSIS — M25661 Stiffness of right knee, not elsewhere classified: Secondary | ICD-10-CM | POA: Diagnosis not present

## 2017-07-08 DIAGNOSIS — M25561 Pain in right knee: Secondary | ICD-10-CM | POA: Diagnosis not present

## 2017-07-08 DIAGNOSIS — M25562 Pain in left knee: Secondary | ICD-10-CM

## 2017-07-08 NOTE — Therapy (Signed)
[facility] [place], [place], [address] Phone: [phone]   Fax:  [phone]  Physical Therapy Treatment  Patient Details  Name: Blake Madden. MRN: [id number] Date of Birth: 02/04/1945 Referring Provider: Dalbert Garnet   Encounter Date: 07/08/2017  PT End of Session - 07/08/17 1137    Visit Number  3    Number of Visits  12    Date for PT Re-Evaluation  08/19/17    PT Start Time  1030    PT Stop Time  1124    PT Time Calculation (min)  54 min    Activity Tolerance  Patient tolerated treatment well    Behavior During Therapy  [facility] for tasks assessed/performed       Past Medical History:  Diagnosis Date  . Allergic rhinitis   . Allergy   . Asthma   . BPH (benign prostatic hypertrophy)   . Depression   . GERD (gastroesophageal reflux disease)   . HTN (hypertension)   . Hyperlipemia   . Migraines     Past Surgical History:  Procedure Laterality Date  . INGUINAL HERNIA REPAIR Bilateral 02/07/2016   Procedure: LAPAROSCOPIC BILATERAL INGUINAL HERNIA REPAIR WITH UMBILICIAL HERNIA REPAIR;  Surgeon: Coralie Keens, MD;  Location: [place];  Service: General;  Laterality: Bilateral;  . INSERTION OF MESH Bilateral 02/07/2016   Procedure: INSERTION OF MESH TO INGUINAL HERNIAS;  Surgeon: Coralie Keens, MD;  Location: [place];  Service: General;  Laterality: Bilateral;  . KNEE ARTHROSCOPY Left     There were no vitals filed for this visit.  Subjective Assessment - 07/08/17 1045    Subjective  Patient with no new complaints.                       [facility] Adult PT Treatment/Exercise - 07/08/17 0001      Exercises   Exercises  Knee/Hip      Knee/Hip Exercises: Aerobic   Stationary Bike  Level 1 x 15 minutes      Knee/Hip Exercises: Machines for Strengthening   Cybex Knee Extension  10# x 4 minutes.    Cybex Knee Flexion  30# x 4 minutes.      Modalities   Modalities  Publishing copy Location  Bilateral knees    Electrical Stimulation Action  Pre-mod.    Electrical Stimulation Parameters  80-150 Hz x 20 minutes.    Electrical Stimulation Goals  Pain      Vasopneumatic   Number Minutes Vasopneumatic   20 minutes    Vasopnuematic Location   -- Bilateral knees.    Vasopneumatic Pressure  Medium               PT Short Term Goals - 07/01/17 2049      PT SHORT TERM GOAL #1   Title  Patient will improve bilateral knee AROM to 0 to normalize gait pattern.    Time  3    Period  Weeks    Status  New      PT SHORT TERM GOAL #2   Title  Patient will negotiate stairs with reciprocating gait pattern and less than 2/10 pain in bilateral knees.    Time  3    Status  New        PT Long Term Goals - 07/01/17 2054      PT LONG TERM GOAL #1  Title  Patient will be independent with advanced HEP    Time  6    Period  Weeks    Status  New      PT LONG TERM GOAL #2   Title  Patient will improve bilateral knee flexion AROM to 120+ degrees to improve functional activities.    Time  6    Period  Weeks    Status  New      PT LONG TERM GOAL #3   Title  Patient will improve bilateral knee MMT to 5/5 in all planes to improve stability during functional activities.    Time  6    Period  Weeks    Status  New            Plan - 07/08/17 1206    Clinical Impression Statement  Patient did an excellent job today with the addition of weight machines.    PT Treatment/Interventions  ADLs/Self Care Home Management;Cryotherapy;Electrical Stimulation;Therapeutic exercise;Balance training;Moist Heat;Dry needling;Manual techniques;Passive range of motion;Taping;Scar mobilization;Vasopneumatic Device;Gait training;Stair training;Neuromuscular re-education    PT Next Visit Plan  Nustep, knee PROM, modalities PRN    Consulted and Agree with Plan of Care  Patient       Patient will benefit  from skilled therapeutic intervention in order to improve the following deficits and impairments:  Pain, Decreased range of motion, Decreased strength, Difficulty walking, Increased edema  Visit Diagnosis: Stiffness of right knee, not elsewhere classified  Acute pain of right knee  Stiffness of left knee, not elsewhere classified  Acute pain of left knee     Problem List Patient Active Problem List   Diagnosis Date Noted  . Benign prostatic hyperplasia   . HTN (hypertension)   . Hyperlipemia   . Asbestos exposure 05/06/2014  . Asthma, chronic obstructive, with acute exacerbation ([place]) 07/17/2012  . GERD (gastroesophageal reflux disease) 05/08/2012  . Depression 05/08/2012    Mechele Kittleson, [place] MPT 07/08/2017, 12:17 PM  [facility] [address] [place], [place], [address] Phone: [phone]   Fax:  [phone]  Name: Blake Madden. MRN: [id number] Date of Birth: 1945-11-14

## 2017-07-10 ENCOUNTER — Encounter: Payer: Self-pay | Admitting: Physical Therapy

## 2017-07-10 ENCOUNTER — Ambulatory Visit: Payer: Medicare Other | Admitting: Physical Therapy

## 2017-07-10 DIAGNOSIS — M25562 Pain in left knee: Secondary | ICD-10-CM

## 2017-07-10 DIAGNOSIS — M25662 Stiffness of left knee, not elsewhere classified: Secondary | ICD-10-CM | POA: Diagnosis not present

## 2017-07-10 DIAGNOSIS — M25661 Stiffness of right knee, not elsewhere classified: Secondary | ICD-10-CM | POA: Diagnosis not present

## 2017-07-10 DIAGNOSIS — M25561 Pain in right knee: Secondary | ICD-10-CM | POA: Diagnosis not present

## 2017-07-10 NOTE — Therapy (Signed)
[facility] [place], [place], [address] Phone: [phone]   Fax:  [phone]  Physical Therapy Treatment  Patient Details  Name: Blake Madden. MRN: [id number] Date of Birth: 08/18/1945 Referring Provider: Dalbert Garnet   Encounter Date: 07/10/2017  PT End of Session - 07/10/17 1241    Visit Number  4    Number of Visits  12    Date for PT Re-Evaluation  08/19/17    PT Start Time  1030    PT Stop Time  1125    PT Time Calculation (min)  55 min    Activity Tolerance  Patient tolerated treatment well    Behavior During Therapy  [facility] for tasks assessed/performed       Past Medical History:  Diagnosis Date  . Allergic rhinitis   . Allergy   . Asthma   . BPH (benign prostatic hypertrophy)   . Depression   . GERD (gastroesophageal reflux disease)   . HTN (hypertension)   . Hyperlipemia   . Migraines     Past Surgical History:  Procedure Laterality Date  . INGUINAL HERNIA REPAIR Bilateral 02/07/2016   Procedure: LAPAROSCOPIC BILATERAL INGUINAL HERNIA REPAIR WITH UMBILICIAL HERNIA REPAIR;  Surgeon: Coralie Keens, MD;  Location: [place];  Service: General;  Laterality: Bilateral;  . INSERTION OF MESH Bilateral 02/07/2016   Procedure: INSERTION OF MESH TO INGUINAL HERNIAS;  Surgeon: Coralie Keens, MD;  Location: [place];  Service: General;  Laterality: Bilateral;  . KNEE ARTHROSCOPY Left     There were no vitals filed for this visit.  Subjective Assessment - 07/10/17 1242    Subjective  Doing great.    Currently in Pain?  Yes    Pain Score  2     Pain Location  Knee    Pain Orientation  Left;Right    Pain Type  Surgical pain    Pain Onset  1 to 4 weeks ago                       [facility] Adult PT Treatment/Exercise - 07/10/17 0001      Exercises   Exercises  Knee/Hip      Knee/Hip Exercises: Aerobic   Stationary Bike  Level 1 x 17 minutes.      Knee/Hip Exercises: Machines for  Strengthening   Cybex Knee Extension  10# x 3 minutes.    Cybex Knee Flexion  40# x 3 minutes.    Cybex Leg Press  2 plates x 3 minutes.      Modalities   Modalities  Health visitor Stimulation Location  Bilateral knees.    Electrical Stimulation Action  Pre-mod.    Electrical Stimulation Parameters  1-10 hz x 20 minutes.    Electrical Stimulation Goals  Pain               PT Short Term Goals - 07/01/17 2049      PT SHORT TERM GOAL #1   Title  Patient will improve bilateral knee AROM to 0 to normalize gait pattern.    Time  3    Period  Weeks    Status  New      PT SHORT TERM GOAL #2   Title  Patient will negotiate stairs with reciprocating gait pattern and less than 2/10 pain in bilateral knees.    Time  3    Status  New        PT Long Term Goals - 07/01/17 2054      PT LONG TERM GOAL #1   Title  Patient will be independent with advanced HEP    Time  6    Period  Weeks    Status  New      PT LONG TERM GOAL #2   Title  Patient will improve bilateral knee flexion AROM to 120+ degrees to improve functional activities.    Time  6    Period  Weeks    Status  New      PT LONG TERM GOAL #3   Title  Patient will improve bilateral knee MMT to 5/5 in all planes to improve stability during functional activities.    Time  6    Period  Weeks    Status  New              Patient will benefit from skilled therapeutic intervention in order to improve the following deficits and impairments:     Visit Diagnosis: Stiffness of right knee, not elsewhere classified  Stiffness of left knee, not elsewhere classified  Acute pain of left knee  Acute pain of right knee     Problem List Patient Active Problem List   Diagnosis Date Noted  . Benign prostatic hyperplasia   . HTN (hypertension)   . Hyperlipemia   . Asbestos exposure 05/06/2014  . Asthma, chronic obstructive, with acute  exacerbation ([place]) 07/17/2012  . GERD (gastroesophageal reflux disease) 05/08/2012  . Depression 05/08/2012    Zykeriah Mathia, [place] MPT 07/10/2017, 12:45 PM  [facility] [address] [place], [place], [address] Phone: [phone]   Fax:  [phone]  Name: Blake Madden. MRN: [id number] Date of Birth: 1945/11/12

## 2017-07-14 ENCOUNTER — Other Ambulatory Visit: Payer: Self-pay | Admitting: *Deleted

## 2017-07-14 DIAGNOSIS — J441 Chronic obstructive pulmonary disease with (acute) exacerbation: Secondary | ICD-10-CM

## 2017-07-14 DIAGNOSIS — J45901 Unspecified asthma with (acute) exacerbation: Secondary | ICD-10-CM

## 2017-07-14 MED ORDER — MONTELUKAST SODIUM 10 MG PO TABS: 10.0000 mg | ORAL_TABLET | Freq: Every day | ORAL | 1 refills | Status: DC

## 2017-07-14 NOTE — Addendum Note (Signed)
Addended by: Antonietta Barcelona D on: 07/14/2017 05:01 PM   Modules accepted: Orders

## 2017-07-14 NOTE — Telephone Encounter (Signed)
I cannot see a message

## 2017-07-15 ENCOUNTER — Ambulatory Visit: Payer: Medicare Other | Admitting: Physical Therapy

## 2017-07-15 ENCOUNTER — Encounter: Payer: Self-pay | Admitting: Physical Therapy

## 2017-07-15 DIAGNOSIS — M25562 Pain in left knee: Secondary | ICD-10-CM | POA: Diagnosis not present

## 2017-07-15 DIAGNOSIS — M25662 Stiffness of left knee, not elsewhere classified: Secondary | ICD-10-CM

## 2017-07-15 DIAGNOSIS — M25661 Stiffness of right knee, not elsewhere classified: Secondary | ICD-10-CM | POA: Diagnosis not present

## 2017-07-15 DIAGNOSIS — M25561 Pain in right knee: Secondary | ICD-10-CM

## 2017-07-15 NOTE — Therapy (Signed)
[facility] [place], [place], [address] Phone: [phone]   Fax:  [phone]  Physical Therapy Treatment  Patient Details  Name: Blake Madden. MRN: [id number] Date of Birth: May 16, 1945 Referring Provider: Dalbert Garnet   Encounter Date: 07/15/2017  PT End of Session - 07/15/17 1601    Visit Number  5    Number of Visits  12    Date for PT Re-Evaluation  08/19/17    PT Start Time  1600    PT Stop Time  1652    PT Time Calculation (min)  52 min    Activity Tolerance  Patient tolerated treatment well    Behavior During Therapy  [facility] for tasks assessed/performed       Past Medical History:  Diagnosis Date  . Allergic rhinitis   . Allergy   . Asthma   . BPH (benign prostatic hypertrophy)   . Depression   . GERD (gastroesophageal reflux disease)   . HTN (hypertension)   . Hyperlipemia   . Migraines     Past Surgical History:  Procedure Laterality Date  . INGUINAL HERNIA REPAIR Bilateral 02/07/2016   Procedure: LAPAROSCOPIC BILATERAL INGUINAL HERNIA REPAIR WITH UMBILICIAL HERNIA REPAIR;  Surgeon: Coralie Keens, MD;  Location: [place];  Service: General;  Laterality: Bilateral;  . INSERTION OF MESH Bilateral 02/07/2016   Procedure: INSERTION OF MESH TO INGUINAL HERNIAS;  Surgeon: Coralie Keens, MD;  Location: [place];  Service: General;  Laterality: Bilateral;  . KNEE ARTHROSCOPY Left     There were no vitals filed for this visit.  Subjective Assessment - 07/15/17 1601    Subjective  Patient reported he did not have trouble returning back to work.    Limitations  Sitting    Patient Stated Goals  improve ROM    Currently in Pain?  No/denies    Pain Score  0-No pain         [facility] PT Assessment - 07/15/17 0001      Assessment   Medical Diagnosis  Chronic pain in both knee    Next MD Visit  07/24/17    Prior Therapy  yes, home care                   [facility] Adult PT Treatment/Exercise -  07/15/17 0001      Exercises   Exercises  Knee/Hip      Knee/Hip Exercises: Aerobic   Stationary Bike  Level 3 x16 min      Knee/Hip Exercises: Machines for Strengthening   Cybex Knee Extension  10# 3x10     Cybex Knee Flexion  40# 3x10    Cybex Leg Press  1 plate eccentric control x10      Knee/Hip Exercises: Standing   Step Down  Both;1 set;15 reps;Hand Hold: 2;Step Height: 4" heel dot    Other Standing Knee Exercises  lateral band walking red theraband x3 in tiled hallway      Modalities   Modalities  Electrical Stimulation;Vasopneumatic      Electrical Stimulation   Electrical Stimulation Location  Bilateral knees.    Electrical Stimulation Action  pre-mod    Electrical Stimulation Parameters  1-10 hz x15 min    Electrical Stimulation Goals  Pain      Vasopneumatic   Number Minutes Vasopneumatic   15 minutes    Vasopnuematic Location   Knee    Vasopneumatic Pressure  Low    Vasopneumatic Temperature   46  PT Short Term Goals - 07/01/17 2049      PT SHORT TERM GOAL #1   Title  Patient will improve bilateral knee AROM to 0 to normalize gait pattern.    Time  3    Period  Weeks    Status  New      PT SHORT TERM GOAL #2   Title  Patient will negotiate stairs with reciprocating gait pattern and less than 2/10 pain in bilateral knees.    Time  3    Status  New        PT Long Term Goals - 07/01/17 2054      PT LONG TERM GOAL #1   Title  Patient will be independent with advanced HEP    Time  6    Period  Weeks    Status  New      PT LONG TERM GOAL #2   Title  Patient will improve bilateral knee flexion AROM to 120+ degrees to improve functional activities.    Time  6    Period  Weeks    Status  New      PT LONG TERM GOAL #3   Title  Patient will improve bilateral knee MMT to 5/5 in all planes to improve stability during functional activities.    Time  6    Period  Weeks    Status  New            Plan - 07/15/17 1638     Clinical Impression Statement  Patient was able to tolerate treatment well with some reports of muscle fatigue. Patient demonstrated very good form with exercises after demonstration. Patient reported with increased difficulty with descending steps and has been trying to negotiate with step over step gait pattern. Patient educated importance of eccentric muscle control for improved reciprocating step pattern. Patient reported understanding. Normal response to modalities upon removal.    Clinical Presentation  Stable    Clinical Decision Making  Low    PT Frequency  2x / week    PT Duration  6 weeks    PT Treatment/Interventions  ADLs/Self Care Home Management;Cryotherapy;Electrical Stimulation;Therapeutic exercise;Balance training;Moist Heat;Dry needling;Manual techniques;Passive range of motion;Taping;Scar mobilization;Vasopneumatic Device;Gait training;Stair training;Neuromuscular re-education    PT Next Visit Plan  cont eccentric quad strengthening, Nustep/bike, knee PROM, modalities PRN    Consulted and Agree with Plan of Care  Patient       Patient will benefit from skilled therapeutic intervention in order to improve the following deficits and impairments:  Pain, Decreased range of motion, Decreased strength, Difficulty walking, Increased edema  Visit Diagnosis: Stiffness of right knee, not elsewhere classified  Stiffness of left knee, not elsewhere classified  Acute pain of left knee  Acute pain of right knee     Problem List Patient Active Problem List   Diagnosis Date Noted  . Benign prostatic hyperplasia   . HTN (hypertension)   . Hyperlipemia   . Asbestos exposure 05/06/2014  . Asthma, chronic obstructive, with acute exacerbation ([place]) 07/17/2012  . GERD (gastroesophageal reflux disease) 05/08/2012  . Depression 05/08/2012    [name], PT, DPT 07/15/2017, 5:58 PM  [facility] [address] [place], [place],  [address] Phone: [phone]   Fax:  [phone]  Name: Blake Madden. MRN: [id number] Date of Birth: [birth year]

## 2017-07-17 ENCOUNTER — Ambulatory Visit: Payer: Medicare Other | Admitting: Physical Therapy

## 2017-07-17 ENCOUNTER — Encounter: Payer: Self-pay | Admitting: Physical Therapy

## 2017-07-17 DIAGNOSIS — M25562 Pain in left knee: Secondary | ICD-10-CM

## 2017-07-17 DIAGNOSIS — M25662 Stiffness of left knee, not elsewhere classified: Secondary | ICD-10-CM | POA: Diagnosis not present

## 2017-07-17 DIAGNOSIS — M25561 Pain in right knee: Secondary | ICD-10-CM

## 2017-07-17 DIAGNOSIS — M25661 Stiffness of right knee, not elsewhere classified: Secondary | ICD-10-CM | POA: Diagnosis not present

## 2017-07-17 NOTE — Therapy (Signed)
[facility] [place], [place], [address] Phone: [phone]   Fax:  [phone]  Physical Therapy Treatment  Patient Details  Name: Blake Madden. MRN: [id number] Date of Birth: [birth year] Referring Provider: Dalbert Garnet   Encounter Date: 07/17/2017  PT End of Session - 07/17/17 1604    Visit Number  6    Number of Visits  12    Date for PT Re-Evaluation  08/19/17    PT Start Time  1600    PT Stop Time  1651    PT Time Calculation (min)  51 min    Activity Tolerance  Patient tolerated treatment well    Behavior During Therapy  [facility] for tasks assessed/performed       Past Medical History:  Diagnosis Date  . Allergic rhinitis   . Allergy   . Asthma   . BPH (benign prostatic hypertrophy)   . Depression   . GERD (gastroesophageal reflux disease)   . HTN (hypertension)   . Hyperlipemia   . Migraines     Past Surgical History:  Procedure Laterality Date  . INGUINAL HERNIA REPAIR Bilateral 02/07/2016   Procedure: LAPAROSCOPIC BILATERAL INGUINAL HERNIA REPAIR WITH UMBILICIAL HERNIA REPAIR;  Surgeon: Coralie Keens, MD;  Location: [place];  Service: General;  Laterality: Bilateral;  . INSERTION OF MESH Bilateral 02/07/2016   Procedure: INSERTION OF MESH TO INGUINAL HERNIAS;  Surgeon: Coralie Keens, MD;  Location: [place];  Service: General;  Laterality: Bilateral;  . KNEE ARTHROSCOPY Left     There were no vitals filed for this visit.  Subjective Assessment - 07/17/17 1603    Subjective  Patient reported feeling fine. Patient reports he gets up from sitting often at work.    Limitations  Sitting    Patient Stated Goals  improve ROM    Currently in Pain?  Yes    Pain Score  3     Pain Location  Knee    Pain Orientation  Right;Left    Pain Descriptors / Indicators  Sore    Pain Type  Surgical pain    Pain Onset  1 to 4 weeks ago    Pain Frequency  Intermittent         [facility] PT Assessment - 07/17/17  0001      Assessment   Medical Diagnosis  Chronic pain in both knee    Next MD Visit  07/24/17    Prior Therapy  yes, home care                   [facility] Adult PT Treatment/Exercise - 07/17/17 0001      Exercises   Exercises  Knee/Hip      Knee/Hip Exercises: Aerobic   Stationary Bike  Level 5 x15 min      Knee/Hip Exercises: Machines for Strengthening   Cybex Knee Extension  10# 2x10 eccentric control    Cybex Knee Flexion   40# 2x10 eccentric control      Knee/Hip Exercises: Standing   Other Standing Knee Exercises  lateral band walking red theraband x2 in carpeted [address]      Modalities   Modalities  Health visitor Stimulation Location  Bilateral knees.    Electrical Stimulation Action  pre-mod    Electrical Stimulation Parameters  1-10 hz x15 min    Electrical Stimulation Goals  Pain      Vasopneumatic  Number Minutes Vasopneumatic   15 minutes    Vasopnuematic Location   Knee    Vasopneumatic Pressure  Low    Vasopneumatic Temperature   34               PT Short Term Goals - 07/01/17 2049      PT SHORT TERM GOAL #1   Title  Patient will improve bilateral knee AROM to 0 to normalize gait pattern.    Time  3    Period  Weeks    Status  New      PT SHORT TERM GOAL #2   Title  Patient will negotiate stairs with reciprocating gait pattern and less than 2/10 pain in bilateral knees.    Time  3    Status  New        PT Long Term Goals - 07/01/17 2054      PT LONG TERM GOAL #1   Title  Patient will be independent with advanced HEP    Time  6    Period  Weeks    Status  New      PT LONG TERM GOAL #2   Title  Patient will improve bilateral knee flexion AROM to 120+ degrees to improve functional activities.    Time  6    Period  Weeks    Status  New      PT LONG TERM GOAL #3   Title  Patient will improve bilateral knee MMT to 5/5 in all planes to improve stability during  functional activities.    Time  6    Period  Weeks    Status  New            Plan - 07/17/17 1653    Clinical Impression Statement  Patient was able to tolerate treatment well with exception of eccentric control knee flexion. Patient reported some superficial right knee pain with eccentric knee flexion. Pain dissipated after stopping exercise. Patient reported no complaints of pain with the remainder of the session. Normal response to modalities upon removal.     Clinical Presentation  Stable    Clinical Decision Making  Low    Rehab Potential  Excellent    PT Frequency  2x / week    PT Duration  6 weeks    PT Treatment/Interventions  ADLs/Self Care Home Management;Cryotherapy;Electrical Stimulation;Therapeutic exercise;Balance training;Moist Heat;Dry needling;Manual techniques;Passive range of motion;Taping;Scar mobilization;Vasopneumatic Device;Gait training;Stair training;Neuromuscular re-education    PT Next Visit Plan  MD note for follow up on 6/27 cont eccentric quad strengthening, Nustep/bike, knee PROM, modalities PRN    Consulted and Agree with Plan of Care  Patient       Patient will benefit from skilled therapeutic intervention in order to improve the following deficits and impairments:  Pain, Decreased range of motion, Decreased strength, Difficulty walking, Increased edema  Visit Diagnosis: Stiffness of right knee, not elsewhere classified  Stiffness of left knee, not elsewhere classified  Acute pain of left knee  Acute pain of right knee     Problem List Patient Active Problem List   Diagnosis Date Noted  . Benign prostatic hyperplasia   . HTN (hypertension)   . Hyperlipemia   . Asbestos exposure 05/06/2014  . Asthma, chronic obstructive, with acute exacerbation ([place]) 07/17/2012  . GERD (gastroesophageal reflux disease) 05/08/2012  . Depression 05/08/2012    [name], PT, DPT 07/17/2017, 6:03 PM  [facility] [address] [place], [place], [address]  Phone: [phone]   Fax:  [phone]  Name: Blake Madden. MRN: [id number] Date of Birth: 09-03-1945

## 2017-07-22 ENCOUNTER — Encounter: Payer: Self-pay | Admitting: Physical Therapy

## 2017-07-22 ENCOUNTER — Ambulatory Visit: Payer: Medicare Other | Admitting: Physical Therapy

## 2017-07-22 DIAGNOSIS — M25561 Pain in right knee: Secondary | ICD-10-CM | POA: Diagnosis not present

## 2017-07-22 DIAGNOSIS — M25661 Stiffness of right knee, not elsewhere classified: Secondary | ICD-10-CM

## 2017-07-22 DIAGNOSIS — M25562 Pain in left knee: Secondary | ICD-10-CM

## 2017-07-22 DIAGNOSIS — M25662 Stiffness of left knee, not elsewhere classified: Secondary | ICD-10-CM | POA: Diagnosis not present

## 2017-07-22 NOTE — Therapy (Addendum)
[facility] [place], [place], [address] Phone: [phone]   Fax:  [phone]  Physical Therapy Treatment/Discharge  Patient Details  Name: Blake Madden. MRN: [id number] Date of Birth: 11/10/1945 Referring Provider: Dalbert Garnet   Encounter Date: 07/22/2017    Past Medical History:  Diagnosis Date  . Allergic rhinitis   . Allergy   . Asthma   . BPH (benign prostatic hypertrophy)   . Depression   . GERD (gastroesophageal reflux disease)   . HTN (hypertension)   . Hyperlipemia   . Migraines     Past Surgical History:  Procedure Laterality Date  . INGUINAL HERNIA REPAIR Bilateral 02/07/2016   Procedure: LAPAROSCOPIC BILATERAL INGUINAL HERNIA REPAIR WITH UMBILICIAL HERNIA REPAIR;  Surgeon: Coralie Keens, MD;  Location: [place];  Service: General;  Laterality: Bilateral;  . INSERTION OF MESH Bilateral 02/07/2016   Procedure: INSERTION OF MESH TO INGUINAL HERNIAS;  Surgeon: Coralie Keens, MD;  Location: [place];  Service: General;  Laterality: Bilateral;  . KNEE ARTHROSCOPY Left     There were no vitals filed for this visit.  Subjective Assessment - 07/22/17 1547    Pain Location  Knee    Pain Orientation  Right;Left    Pain Descriptors / Indicators  Sore    Pain Onset  More than a month ago         [facility] PT Assessment - 07/22/17 0001      AROM   Right Knee Extension  7    Right Knee Flexion  123    Left Knee Extension  7    Left Knee Flexion  123                   [facility] Adult PT Treatment/Exercise - 07/22/17 0001      Exercises   Exercises  Knee/Hip      Knee/Hip Exercises: Aerobic   Stationary Bike  Level 3 x 15 minutes.      Knee/Hip Exercises: Machines for Strengthening   Cybex Knee Extension  10# x 3 minutes.    Cybex Knee Flexion  40# x 3 minutes.      Modalities   Modalities  Health visitor Stimulation  Location  Bilateral knees.    Electrical Stimulation Action  Pre-mod.    Electrical Stimulation Parameters  1-10 Hz x 15 minutes.    Electrical Stimulation Goals  Pain               PT Short Term Goals - 07/01/17 2049      PT SHORT TERM GOAL #1   Title  Patient will improve bilateral knee AROM to 0 to normalize gait pattern.    Time  3    Period  Weeks    Status  New      PT SHORT TERM GOAL #2   Title  Patient will negotiate stairs with reciprocating gait pattern and less than 2/10 pain in bilateral knees.    Time  3    Status  New        PT Long Term Goals - 07/01/17 2054      PT LONG TERM GOAL #1   Title  Patient will be independent with advanced HEP    Time  6    Period  Weeks    Status  New      PT LONG TERM GOAL #2   Title  Patient  will improve bilateral knee flexion AROM to 120+ degrees to improve functional activities.    Time  6    Period  Weeks    Status  New      PT LONG TERM GOAL #3   Title  Patient will improve bilateral knee MMT to 5/5 in all planes to improve stability during functional activities.    Time  6    Period  Weeks    Status  New              Patient will benefit from skilled therapeutic intervention in order to improve the following deficits and impairments:     Visit Diagnosis: Stiffness of right knee, not elsewhere classified  Stiffness of left knee, not elsewhere classified  Acute pain of left knee  Acute pain of right knee     Problem List Patient Active Problem List   Diagnosis Date Noted  . Benign prostatic hyperplasia   . HTN (hypertension)   . Hyperlipemia   . Asbestos exposure 05/06/2014  . Asthma, chronic obstructive, with acute exacerbation ([place]) 07/17/2012  . GERD (gastroesophageal reflux disease) 05/08/2012  . Depression 05/08/2012   PHYSICAL THERAPY DISCHARGE SUMMARY  Visits from Start of Care: 7  Current functional level related to goals / functional outcomes: See above   Remaining  deficits: Goals not assessed throughout session   Education / Equipment: HEP Plan: Patient agrees to discharge.  Patient goals were not met. Patient is being discharged due to being pleased with the current functional level.  ?????    [name], PT, DPT   Deepa Barthel, [place] MPT 07/22/2017, 3:54 PM  [facility] [address] [place], [place], [address] Phone: [phone]   Fax:  [phone]  Name: Blake Madden. MRN: [id number] Date of Birth: 03/18/1945

## 2017-07-24 ENCOUNTER — Encounter: Payer: Medicare Other | Admitting: Physical Therapy

## 2017-08-06 DIAGNOSIS — M25462 Effusion, left knee: Secondary | ICD-10-CM | POA: Diagnosis not present

## 2017-08-06 DIAGNOSIS — Z471 Aftercare following joint replacement surgery: Secondary | ICD-10-CM | POA: Diagnosis not present

## 2017-08-06 DIAGNOSIS — Z96653 Presence of artificial knee joint, bilateral: Secondary | ICD-10-CM | POA: Diagnosis not present

## 2017-08-06 DIAGNOSIS — M25461 Effusion, right knee: Secondary | ICD-10-CM | POA: Diagnosis not present

## 2017-08-28 ENCOUNTER — Other Ambulatory Visit: Payer: Self-pay | Admitting: Family

## 2017-09-04 ENCOUNTER — Ambulatory Visit (INDEPENDENT_AMBULATORY_CARE_PROVIDER_SITE_OTHER): Payer: Medicare Other | Admitting: Family

## 2017-09-04 ENCOUNTER — Encounter: Payer: Self-pay | Admitting: Family

## 2017-09-04 VITALS — BP 100/70 | HR 73 | Temp 98.3°F | Ht 67.0 in | Wt 157.4 lb

## 2017-09-04 DIAGNOSIS — I10 Essential (primary) hypertension: Secondary | ICD-10-CM | POA: Diagnosis not present

## 2017-09-04 DIAGNOSIS — J441 Chronic obstructive pulmonary disease with (acute) exacerbation: Secondary | ICD-10-CM | POA: Diagnosis not present

## 2017-09-04 DIAGNOSIS — J45901 Unspecified asthma with (acute) exacerbation: Secondary | ICD-10-CM

## 2017-09-04 MED ORDER — PREDNISONE 20 MG PO TABS: ORAL_TABLET | ORAL | 0 refills | Status: DC

## 2017-09-04 MED ORDER — METOPROLOL SUCCINATE ER 25 MG PO TB24: 25.0000 mg | ORAL_TABLET | Freq: Every day | ORAL | 3 refills | Status: DC

## 2017-09-04 NOTE — Patient Instructions (Signed)

## 2017-09-04 NOTE — Progress Notes (Signed)
   Subjective:    Patient ID: Blake Kinds., male    DOB: 01-13-1946, 72 y.o.   MRN: [id number]  Chief Complaint  Patient presents with  . sinus drainage  . Cough   PT presents to the office today with cough and wheezing.  States he has not been taking his Symbicort related to cost.   Wife states his BP has been decreased when home health came to check BP, sometimes 90's/70's. Cough  This is a new problem. The current episode started 1 to 4 weeks ago. The problem has been gradually worsening. The problem occurs every few minutes. The cough is productive of sputum. Associated symptoms include nasal congestion, postnasal drip, shortness of breath and wheezing. Pertinent negatives include no chills, ear congestion, ear pain, fever, headaches, myalgias or sore throat. The symptoms are aggravated by lying down. He has tried rest and OTC cough suppressant for the symptoms. The treatment provided mild relief. There is no history of COPD.      Review of Systems  Constitutional: Negative for chills and fever.  HENT: Positive for postnasal drip. Negative for ear pain and sore throat.   Respiratory: Positive for cough, shortness of breath and wheezing.   Musculoskeletal: Negative for myalgias.  Neurological: Negative for headaches.  All other systems reviewed and are negative.      Objective:   Physical Exam  Constitutional: He is oriented to person, place, and time. He appears well-developed and well-nourished. No distress.  HENT:  Head: Normocephalic.  Right Ear: External ear normal.  Left Ear: External ear normal.  Nose: Mucosal edema and rhinorrhea present.  Mouth/Throat: Posterior oropharyngeal erythema present.  Eyes: Pupils are equal, round, and reactive to light. Right eye exhibits no discharge. Left eye exhibits no discharge.  Neck: Normal range of motion. Neck supple. No thyromegaly present.  Cardiovascular: Normal rate, regular rhythm, normal heart sounds and intact distal  pulses.  No murmur heard. Pulmonary/Chest: Effort normal. Stridor present. No respiratory distress. He has wheezes.  Abdominal: Soft. Bowel sounds are normal. He exhibits no distension. There is no tenderness.  Musculoskeletal: Normal range of motion. He exhibits no edema or tenderness.  Neurological: He is alert and oriented to person, place, and time. He has normal reflexes. No cranial nerve deficit.  Skin: Skin is warm and dry. No rash noted. No erythema.  Psychiatric: He has a normal mood and affect. His behavior is normal. Judgment and thought content normal.  Vitals reviewed.     BP 100/70   Pulse 73   Temp 98.3 F (36.8 C) (Oral)   Ht 5\' 7"  (1.702 m)   Wt 157 lb 6.4 oz (71.4 kg)   BMI 24.65 kg/m      Assessment & Plan:  Blake Madden was seen today for sinus drainage and cough.  Diagnoses and all orders for this visit:  Essential hypertension -     metoprolol succinate (TOPROL-XL) 25 MG 24 hr tablet; Take 1 tablet (25 mg total) by mouth daily.  Asthma, chronic obstructive, with acute exacerbation (HCC) -     predniSONE (DELTASONE) 20 MG tablet; Take 3 tabs daily for 1 week, then 2 tabs for 1 week, then 1 tab for one week   We will decrease metoprolol to 25 mg from 50 mg Will start prednisone and Symbicort sample given today Avoid allergens  RTO as needed or if symptoms worsens   Evelina Dun, FNP

## 2017-09-15 ENCOUNTER — Telehealth: Payer: Self-pay | Admitting: *Deleted

## 2017-09-15 ENCOUNTER — Other Ambulatory Visit: Payer: Self-pay | Admitting: *Deleted

## 2017-09-15 MED ORDER — OMEPRAZOLE 40 MG PO CPDR: 40.0000 mg | DELAYED_RELEASE_CAPSULE | Freq: Every day | ORAL | 1 refills | Status: DC

## 2017-09-15 MED ORDER — ZOLPIDEM TARTRATE 10 MG PO TABS: 10.0000 mg | ORAL_TABLET | Freq: Every day | ORAL | 0 refills | Status: DC

## 2017-09-15 NOTE — Telephone Encounter (Signed)
ambien refill. Last filled 05/29/17 with 2 RF, last seen 09/04/17. Blake Madden's pt

## 2017-09-15 NOTE — Telephone Encounter (Signed)
Sent in 30 days. Needs to be seen for more refills. Can you call to set up with pcp? Last appt acute visit, needs chronic follow up.

## 2017-09-20 ENCOUNTER — Other Ambulatory Visit: Payer: Self-pay | Admitting: Family

## 2017-09-20 DIAGNOSIS — J45901 Unspecified asthma with (acute) exacerbation: Secondary | ICD-10-CM

## 2017-09-20 DIAGNOSIS — J441 Chronic obstructive pulmonary disease with (acute) exacerbation: Secondary | ICD-10-CM

## 2017-10-01 ENCOUNTER — Ambulatory Visit (INDEPENDENT_AMBULATORY_CARE_PROVIDER_SITE_OTHER): Payer: Medicare Other

## 2017-10-01 VITALS — BP 117/72 | HR 72 | Temp 98.7°F | Ht 67.0 in | Wt 164.0 lb

## 2017-10-01 DIAGNOSIS — Z0000 Encounter for general adult medical examination without abnormal findings: Secondary | ICD-10-CM | POA: Diagnosis not present

## 2017-10-01 NOTE — Patient Instructions (Signed)
  Blake Madden , Thank you for taking time to come for your Medicare Wellness Visit. I appreciate your ongoing commitment to your health goals. Please review the following plan we discussed and let me know if I can assist you in the future.   These are the goals we discussed: Goals    . Have 3 meals a day       This is a list of the screening recommended for you and due dates:  Health Maintenance  Topic Date Due  . Flu Shot  09/05/2018*  . Tetanus Vaccine  10/02/2018*  .  Hepatitis C: One time screening is recommended by Center for Disease Control  (CDC) for  adults born from [birth year] through 1966.   Completed  . Pneumonia vaccines  Completed  *Topic was postponed. The date shown is not the original due date.

## 2017-10-01 NOTE — Progress Notes (Signed)
Subjective:   Blake Marrow. is a 72 y.o. male who presents for an Initial Medicare Annual Wellness Visit.  He is divorced and has three children, two daughters and one son; and four grandchildren.  In his spare time he enjoys bicycling and spending time at the beach where he plays golf and fishes.  He has worked many years for [employer] and plans to retire this year and move to [place] where he is building a home.    Review of Systems   Cardiac Risk Factors include: advanced age (>2men, >51 women);dyslipidemia;male gender;hypertension    Objective:    Today's Vitals   10/01/17 1420  BP: 117/72  Pulse: 72  Temp: 98.7 F (37.1 C)  TempSrc: Oral  Weight: 164 lb (74.4 kg)  Height: 5\' 7"  (1.702 m)   Body mass index is 25.69 kg/m.  Advanced Directives 10/01/2017 07/01/2017 11/17/2016 02/01/2016 04/25/2015  Does Patient Have a Medical Advance Directive? No No No No No  Would patient like information on creating a medical advance directive? No - Patient declined - - Yes (MAU/Ambulatory/Procedural Areas - Information given) -    Current Medications (verified) Outpatient Encounter Medications as of 10/01/2017  Medication Sig  . albuterol (PROAIR HFA) 108 (90 BASE) MCG/ACT inhaler Inhale 2 puffs into the lungs 4 (four) times daily as needed. (Patient taking differently: Inhale 2 puffs into the lungs 4 (four) times daily as needed for wheezing or shortness of breath. )  . aspirin EC 325 MG tablet Take 1 tablet (325 mg total) by mouth daily.  Marland Kitchen atorvastatin (LIPITOR) 40 MG tablet Take 1 tablet (40 mg total) by mouth daily.  . budesonide-formoterol (SYMBICORT) 160-4.5 MCG/ACT inhaler Inhale 2 puffs into the lungs 2 (two) times daily.  . metoprolol succinate (TOPROL-XL) 25 MG 24 hr tablet Take 1 tablet (25 mg total) by mouth daily.  . montelukast (SINGULAIR) 10 MG tablet Take 1 tablet (10 mg total) by mouth at bedtime.  . Naproxen Sodium (ALEVE) 220 MG CAPS Take 220 mg by mouth daily as  needed (pain).   Marland Kitchen omeprazole (PRILOSEC) 40 MG capsule Take 1 capsule (40 mg total) by mouth daily.  . predniSONE (DELTASONE) 20 MG tablet Take 3 tabs daily for 1 week, then 2 tabs for 1 week, then 1 tab for one week  . QUEtiapine (SEROQUEL) 200 MG tablet TAKE 1 TABLET BY MOUTH EVERYDAY AT BEDTIME  . venlafaxine XR (EFFEXOR-XR) 150 MG 24 hr capsule TAKE 1 CAPSULE EVERY DAY WITH BREAKFAST  . zolpidem (AMBIEN) 10 MG tablet Take 1 tablet (10 mg total) by mouth at bedtime.   No facility-administered encounter medications on file as of 10/01/2017.     Allergies (verified) No known allergies   History: Past Medical History:  Diagnosis Date  . Allergic rhinitis   . Allergy   . Asthma   . BPH (benign prostatic hypertrophy)   . Depression   . GERD (gastroesophageal reflux disease)   . HTN (hypertension)   . Hyperlipemia   . Migraines    Past Surgical History:  Procedure Laterality Date  . INGUINAL HERNIA REPAIR Bilateral 02/07/2016   Procedure: LAPAROSCOPIC BILATERAL INGUINAL HERNIA REPAIR WITH UMBILICIAL HERNIA REPAIR;  Surgeon: Coralie Keens, MD;  Location: [facility] [facility];  Service: General;  Laterality: Bilateral;  . INSERTION OF MESH Bilateral 02/07/2016   Procedure: INSERTION OF MESH TO INGUINAL HERNIAS;  Surgeon: Coralie Keens, MD;  Location: [facility];  Service: General;  Laterality: Bilateral;  . KNEE ARTHROSCOPY  Left    Family History  Problem Relation Age of Onset  . Lung cancer Father        lung  . Bladder Cancer Father   . Colon cancer Sister   . Colon cancer Brother    Social History   Socioeconomic History  . Marital status: Divorced    Spouse name: Not on file  . Number of children: Not on file  . Years of education: Not on file  . Highest education level: Not on file  Occupational History  . Occupation: full time    Employer: [employer]  . Financial resource strain: Not on file  . Food insecurity:    Worry: Never true    Inability:  Never true  . Transportation needs:    Medical: No    Non-medical: No  Tobacco Use  . Smoking status: Never Smoker  . Smokeless tobacco: Never Used  Substance and Sexual Activity  . Alcohol use: No    Alcohol/week: 2.0 standard drinks    Types: 2 Cans of beer per week    Comment: rare  . Drug use: No  . Sexual activity: Not on file  Lifestyle  . Physical activity:    Days per week: 3 days    Minutes per session: 60 min  . Stress: Not at all  Relationships  . Social connections:    Talks on phone: More than three times a week    Gets together: Three times a week    Attends religious service: More than 4 times per year    Active member of club or organization: Yes    Attends meetings of clubs or organizations: More than 4 times per year    Relationship status: Divorced  Other Topics Concern  . Not on file  Social History Narrative  . Not on file     Clinical Intake:  Activities of Daily Living In your present state of health, do you have any difficulty performing the following activities: 10/01/2017  Hearing? N  Vision? N  Difficulty concentrating or making decisions? N  Walking or climbing stairs? N  Dressing or bathing? N  Doing errands, shopping? N  Preparing Food and eating ? N  Using the Toilet? N  In the past six months, have you accidently leaked urine? N  Do you have problems with loss of bowel control? N  Managing your Medications? N  Managing your Finances? N  Housekeeping or managing your Housekeeping? N  Some recent data might be hidden     Immunizations and Health Maintenance Immunization History  Administered Date(s) Administered  . Influenza,inj,Quad PF,6+ Mos 03/09/2014, 12/19/2014  . Pneumococcal Conjugate-13 03/09/2014  . Pneumococcal Polysaccharide-23 07/08/2016   There are no preventive care reminders to display for this patient.  Patient Care Team: Sharion Balloon, FNP as PCP - General (Family Medicine)  Indicate any recent Medical  Services you may have received from other than [facility] providers in the past year (date may be approximate).    Assessment:   This is a routine wellness examination for Blake Madden.   Dietary issues and exercise activities discussed: Current Exercise Habits: Home exercise routine, Type of exercise: Other - see comments(bicycle riding), Time (Minutes): 60, Frequency (Times/Week): 3, Weekly Exercise (Minutes/Week): 180, Intensity: Moderate, Exercise limited by: respiratory conditions(s)  Goals    . Have 3 meals a day      Depression Screen PHQ 2/9 Scores 10/01/2017 09/04/2017 03/21/2017 07/08/2016  PHQ - 2 Score  0 2 0 0  PHQ- 9 Score - 3 - -    Fall Risk Fall Risk  10/01/2017 09/04/2017 03/27/2017 03/21/2017 07/08/2016  Falls in the past year? No No Yes No No  Number falls in past yr: - - 1 - -  Injury with Fall? - - (No Data) - -  Comment - - Shoulder pain. - -    Is the patient's home free of loose throw rugs in walkways, pet beds, electrical cords, etc?   Yes      Grab bars in the bathroom? No      Handrails on the stairs?   Yes      Adequate lighting?   Yes   Cognitive Function: MMSE - Mini Mental State Exam 10/01/2017  Orientation to time 5  Orientation to Place 5  Registration 3  Attention/ Calculation 5  Recall 1  Language- name 2 objects 2  Language- repeat 1  Language- follow 3 step command 3  Language- read & follow direction 1  Write a sentence 1  Copy design 1  Total score 28    Blake Madden performed well on the MMSE, scoring 28 out of 30 available points.    Screening Tests Health Maintenance  Topic Date Due  . INFLUENZA VACCINE  09/05/2018 (Originally 08/28/2017)  . TETANUS/TDAP  10/02/2018 (Originally 10/01/1964)  . Hepatitis C Screening  Completed  . PNA vac Low Risk Adult  Completed      Cancer Screenings: Lung: Low Dose CT Chest recommended if Age 110-80 years, 30 pack-year currently smoking OR have quit w/in 15years. Patient does not qualify. Colorectal:         Plan:     Follow up with PCP as needed  Obtain flu vaccine   I have personally reviewed and noted the following in the patient's chart:   . Medical and social history . Use of alcohol, tobacco or illicit drugs  . Current medications and supplements . Functional ability and status . Nutritional status . Physical activity . Advanced directives . List of other physicians . Hospitalizations, surgeries, and ER visits in previous 12 months . Vitals . Screenings to include cognitive, depression, and falls . Referrals and appointments  In addition, I have reviewed and discussed with patient certain preventive protocols, quality metrics, and best practice recommendations. A written personalized care plan for preventive services as well as general preventive health recommendations were provided to patient.     Burnadette Pop, LPN   0/09/8117

## 2017-10-14 ENCOUNTER — Other Ambulatory Visit: Payer: Self-pay | Admitting: Pediatrics

## 2017-10-15 NOTE — Telephone Encounter (Signed)
Last seen 09/04/17

## 2017-11-13 ENCOUNTER — Other Ambulatory Visit: Payer: Self-pay | Admitting: Family

## 2017-11-14 NOTE — Telephone Encounter (Signed)
Last seen 09/04/17

## 2017-11-18 DIAGNOSIS — L579 Skin changes due to chronic exposure to nonionizing radiation, unspecified: Secondary | ICD-10-CM | POA: Diagnosis not present

## 2017-11-18 DIAGNOSIS — D1801 Hemangioma of skin and subcutaneous tissue: Secondary | ICD-10-CM | POA: Diagnosis not present

## 2017-11-18 DIAGNOSIS — L814 Other melanin hyperpigmentation: Secondary | ICD-10-CM | POA: Diagnosis not present

## 2017-11-18 DIAGNOSIS — L820 Inflamed seborrheic keratosis: Secondary | ICD-10-CM | POA: Diagnosis not present

## 2017-11-18 DIAGNOSIS — D225 Melanocytic nevi of trunk: Secondary | ICD-10-CM | POA: Diagnosis not present

## 2017-11-23 DIAGNOSIS — Z23 Encounter for immunization: Secondary | ICD-10-CM | POA: Diagnosis not present

## 2017-12-15 ENCOUNTER — Encounter: Payer: Self-pay | Admitting: Nurse Practitioner

## 2017-12-15 ENCOUNTER — Ambulatory Visit (INDEPENDENT_AMBULATORY_CARE_PROVIDER_SITE_OTHER): Payer: Medicare Other | Admitting: Nurse Practitioner

## 2017-12-15 VITALS — BP 137/82 | HR 88 | Temp 98.5°F | Ht 67.0 in | Wt 157.0 lb

## 2017-12-15 DIAGNOSIS — J206 Acute bronchitis due to rhinovirus: Secondary | ICD-10-CM | POA: Diagnosis not present

## 2017-12-15 MED ORDER — PREDNISONE 10 MG (21) PO TBPK: ORAL_TABLET | ORAL | 0 refills | Status: DC

## 2017-12-15 NOTE — Progress Notes (Signed)
Subjective:    Patient ID: Blake Kinds., male    DOB: 08-May-1945, 72 y.o.   MRN: [id number]   Chief Complaint: Cough   HPI Patient come sin today c/o cough and wheezing that started over a week ago. Wheezing is getting worse. He has history of asbestoses and is on symbicort daily.   Review of Systems  Constitutional: Negative for activity change and appetite change.  HENT: Positive for congestion. Negative for ear discharge, sinus pain, sore throat and trouble swallowing.   Eyes: Negative for pain.  Respiratory: Positive for cough. Negative for shortness of breath.   Cardiovascular: Negative for chest pain, palpitations and leg swelling.  Gastrointestinal: Negative for abdominal pain.  Endocrine: Negative for polydipsia.  Genitourinary: Negative.   Skin: Negative for rash.  Neurological: Negative for dizziness, weakness and headaches.  Hematological: Does not bruise/bleed easily.  Psychiatric/Behavioral: Negative.   All other systems reviewed and are negative.      Objective:   Physical Exam  Constitutional: He is oriented to person, place, and time. He appears well-developed and well-nourished. No distress.  HENT:  Right Ear: Hearing, tympanic membrane, external ear and ear canal normal.  Left Ear: Hearing, tympanic membrane, external ear and ear canal normal.  Nose: Mucosal edema and rhinorrhea present. Right sinus exhibits no maxillary sinus tenderness and no frontal sinus tenderness. Left sinus exhibits no maxillary sinus tenderness and no frontal sinus tenderness.  Mouth/Throat: Uvula is midline, oropharynx is clear and moist and mucous membranes are normal.  Eyes: Pupils are equal, round, and reactive to light. EOM are normal.  Neck: Normal range of motion. Neck supple.  Cardiovascular: Normal rate and regular rhythm.  Pulmonary/Chest: Effort normal. He has wheezes (insp and exp wheezes throughout. insp wheezes clear after coughing).  Lymphadenopathy:    He has  no cervical adenopathy.  Neurological: He is alert and oriented to person, place, and time.  Skin: Skin is warm.  Psychiatric: He has a normal mood and affect. His behavior is normal. Judgment and thought content normal.  Nursing note and vitals reviewed.   BP 137/82   Pulse 88   Temp 98.5 F (36.9 C) (Oral)   Ht 5\' 7"  (1.702 m)   Wt 157 lb (71.2 kg)   SpO2 94%   BMI 24.59 kg/m      Assessment & Plan:  Blake Kinds. in today with chief complaint of Cough and Nasal Congestion   1. Acute bronchitis due to Rhinovirus  1. Take meds as prescribed 2. Use a cool mist humidifier especially during the winter months and when heat has been humid. 3. Use saline nose sprays frequently 4. Saline irrigations of the nose can be very helpful if done frequently.  * 4X daily for 1 week*  * Use of a nettie pot can be helpful with this. Follow directions with this* 5. Drink plenty of fluids 6. Keep thermostat turn down low 7.For any cough or congestion  Use plain Mucinex- regular strength or max strength is fine   * Children- consult with Pharmacist for dosing 8. For fever or aces or pains- take tylenol or ibuprofen appropriate for age and weight.  * for fevers greater than 101 orally you may alternate ibuprofen and tylenol every  3 hours.   Meds ordered this encounter  Medications  . predniSONE (STERAPRED UNI-PAK 21 TAB) 10 MG (21) TBPK tablet    Sig: As directed x 6 days    Dispense:  21 tablet  Refill:  0    Order Specific Question:   Supervising Provider    Answer:   Eustaquio Maize [place], FNP

## 2017-12-15 NOTE — Patient Instructions (Signed)

## 2017-12-24 ENCOUNTER — Telehealth: Payer: Self-pay | Admitting: Family

## 2017-12-24 NOTE — Telephone Encounter (Signed)
If not improving needs to be seen

## 2017-12-24 NOTE — Telephone Encounter (Signed)
Aware of provider's advice. 

## 2017-12-24 NOTE — Telephone Encounter (Signed)
Patient was seen 12-15-17 by [facility] and diagnosed with bronchitis.  He was given a pred pack..  Please advise on request for more medicine and samples.

## 2018-01-22 ENCOUNTER — Other Ambulatory Visit: Payer: Self-pay | Admitting: Family

## 2018-01-23 NOTE — Telephone Encounter (Signed)
Last seen 12/15/17

## 2018-02-01 ENCOUNTER — Other Ambulatory Visit: Payer: Self-pay | Admitting: Family

## 2018-03-08 ENCOUNTER — Other Ambulatory Visit: Payer: Self-pay | Admitting: Family

## 2018-04-12 ENCOUNTER — Other Ambulatory Visit: Payer: Self-pay | Admitting: Family

## 2018-04-13 NOTE — Telephone Encounter (Signed)
Last seen 12/15/17

## 2018-04-23 ENCOUNTER — Encounter: Payer: Self-pay | Admitting: Family

## 2018-04-23 ENCOUNTER — Other Ambulatory Visit: Payer: Self-pay

## 2018-04-23 ENCOUNTER — Ambulatory Visit (INDEPENDENT_AMBULATORY_CARE_PROVIDER_SITE_OTHER): Payer: Medicare Other | Admitting: Family

## 2018-04-23 DIAGNOSIS — G4700 Insomnia, unspecified: Secondary | ICD-10-CM | POA: Insufficient documentation

## 2018-04-23 DIAGNOSIS — J441 Chronic obstructive pulmonary disease with (acute) exacerbation: Secondary | ICD-10-CM

## 2018-04-23 DIAGNOSIS — J45901 Unspecified asthma with (acute) exacerbation: Secondary | ICD-10-CM | POA: Diagnosis not present

## 2018-04-23 MED ORDER — TRAZODONE HCL 50 MG PO TABS: 100.0000 mg | ORAL_TABLET | Freq: Every evening | ORAL | 3 refills | Status: DC | PRN

## 2018-04-23 MED ORDER — PREDNISONE 10 MG (21) PO TBPK: ORAL_TABLET | ORAL | 0 refills | Status: DC

## 2018-04-23 NOTE — Progress Notes (Signed)
Virtual Visit via telephone Note  I connected with Blake Kinds. on 04/23/18 at 11:34 AM by telephone and verified that I am speaking with the correct person using two identifiers. Blake Kinds. is currently located in his car and Blake Madden (girlfriend) is currently with her during visit. The provider, Evelina Dun, FNP is located in their office at time of visit.  I discussed the limitations, risks, security and privacy concerns of performing an evaluation and management service by telephone and the availability of in person appointments. I also discussed with the patient that there may be a patient responsible charge related to this service. The patient expressed understanding and agreed to proceed.   History and Present Illness:  Cough  This is a new problem. The current episode started 1 to 4 weeks ago. The problem has been gradually worsening. The problem occurs every few minutes. The cough is non-productive. Associated symptoms include headaches ("a little one") and wheezing. Pertinent negatives include no chills, ear congestion, ear pain, fever, myalgias, sore throat, shortness of breath or weight loss. He has tried rest (tylenol, mucinex) for the symptoms. His past medical history is significant for asthma.  Insomnia  Primary symptoms: difficulty falling asleep, frequent awakening, malaise/fatigue.  The current episode started more than one year. The onset quality is gradual. The problem occurs intermittently. The problem has been waxing and waning since onset.      Review of Systems  Constitutional: Positive for malaise/fatigue. Negative for chills, fever and weight loss.  HENT: Negative for ear pain and sore throat.   Respiratory: Positive for cough and wheezing. Negative for shortness of breath.   Musculoskeletal: Negative for myalgias.  Neurological: Positive for headaches ("a little one").  Psychiatric/Behavioral: The patient has insomnia.   All other systems reviewed  and are negative.    Observations/Objective: Stable, no SOB or distress noted on phone call  Assessment and Plan: 1. Asthma, chronic obstructive, with acute exacerbation (HCC) Continue Symbicort Avoid allergens - Take meds as prescribed - Use a cool mist humidifier  -Use saline nose sprays frequently -Force fluids -For any cough or congestion  Use plain Mucinex- regular strength or max strength is fine -Call office if symptoms worsen or do not improve  - predniSONE (STERAPRED UNI-PAK 21 TAB) 10 MG (21) TBPK tablet; As directed x 6 days  Dispense: 21 tablet; Refill: 0  2. Insomnia, unspecified type Will stop Ambien and try Trazodone  Sleep ritual discussed - traZODone (DESYREL) 50 MG tablet; Take 2-3 tablets (100-150 mg total) by mouth at bedtime as needed for sleep.  Dispense: 90 tablet; Refill: 3    I discussed the assessment and treatment plan with the patient. The patient was provided an opportunity to ask questions and all were answered. The patient agreed with the plan and demonstrated an understanding of the instructions.   The patient was advised to call back or seek an in-person evaluation if the symptoms worsen or if the condition fails to improve as anticipated.  The above assessment and management plan was discussed with the patient. The patient verbalized understanding of and has agreed to the management plan. Patient is aware to call the clinic if symptoms persist or worsen. Patient is aware when to return to the clinic for a follow-up visit. Patient educated on when it is appropriate to go to the emergency department.    Called ended 11:45 AM, I provided 12 minutes of non-face-to-face time during this encounter.    Evelina Dun,  FNP

## 2018-04-23 NOTE — Telephone Encounter (Signed)
Appointment scheduled.

## 2018-05-15 ENCOUNTER — Other Ambulatory Visit: Payer: Self-pay | Admitting: Family

## 2018-05-15 DIAGNOSIS — G4700 Insomnia, unspecified: Secondary | ICD-10-CM

## 2018-06-04 ENCOUNTER — Other Ambulatory Visit: Payer: Self-pay | Admitting: Family

## 2018-06-05 ENCOUNTER — Other Ambulatory Visit: Payer: Self-pay | Admitting: Family

## 2018-06-08 ENCOUNTER — Other Ambulatory Visit: Payer: Self-pay | Admitting: Family

## 2018-06-08 MED ORDER — ZOLPIDEM TARTRATE 10 MG PO TABS: 10.0000 mg | ORAL_TABLET | Freq: Every evening | ORAL | 1 refills | Status: DC | PRN

## 2018-07-20 ENCOUNTER — Other Ambulatory Visit: Payer: Self-pay | Admitting: Family

## 2018-08-07 ENCOUNTER — Encounter: Payer: Self-pay | Admitting: Family Medicine

## 2018-08-07 ENCOUNTER — Telehealth: Payer: Self-pay | Admitting: Family

## 2018-08-07 ENCOUNTER — Ambulatory Visit (INDEPENDENT_AMBULATORY_CARE_PROVIDER_SITE_OTHER): Payer: Medicare Other | Admitting: Family Medicine

## 2018-08-07 DIAGNOSIS — J4541 Moderate persistent asthma with (acute) exacerbation: Secondary | ICD-10-CM

## 2018-08-07 DIAGNOSIS — J45901 Unspecified asthma with (acute) exacerbation: Secondary | ICD-10-CM | POA: Diagnosis not present

## 2018-08-07 DIAGNOSIS — J441 Chronic obstructive pulmonary disease with (acute) exacerbation: Secondary | ICD-10-CM | POA: Diagnosis not present

## 2018-08-07 MED ORDER — PREDNISONE 10 MG (21) PO TBPK: ORAL_TABLET | ORAL | 0 refills | Status: DC

## 2018-08-07 MED ORDER — BUDESONIDE-FORMOTEROL FUMARATE 160-4.5 MCG/ACT IN AERO: 2.0000 | INHALATION_SPRAY | Freq: Two times a day (BID) | RESPIRATORY_TRACT | 3 refills | Status: DC

## 2018-08-07 NOTE — Telephone Encounter (Signed)
Aware.  [facility] has prednisone and we do not have any symbicort samples.

## 2018-08-07 NOTE — Progress Notes (Signed)
Virtual Visit via telephone Note Due to COVID-19, visit is conducted virtually and was requested by patient. This visit type was conducted due to national recommendations for restrictions regarding the COVID-19 Pandemic (e.g. social distancing) in an effort to limit this patient's exposure and mitigate transmission in our community. All issues noted in this document were discussed and addressed.  A physical exam was not performed with this format.   I connected with Blake Kinds. on 08/07/18 at [phone] by telephone and verified that I am speaking with the correct person using two identifiers. Blake Kinds. is currently located at home and family is currently with them during visit. The provider, Monia Pouch, FNP is located in their office at time of visit.  I discussed the limitations, risks, security and privacy concerns of performing an evaluation and management service by telephone and the availability of in person appointments. I also discussed with the patient that there may be a patient responsible charge related to this service. The patient expressed understanding and agreed to proceed.  Subjective:  Patient ID: Blake Kinds., male    DOB: [birth year], 73 y.o.   MRN: [id number]  Chief Complaint:  Asthma   HPI: Blake Bentsen. is a 73 y.o. male presenting on 08/07/2018 for Asthma   Pt reports increased wheezing over the last few days. He denies chest tightness, shortness of breath, fever, chills, fatigue, or weakness. States he has just been wheezing more and wants to get some steroids before it gets bad.   Asthma He complains of wheezing. There is no chest tightness, cough, difficulty breathing, shortness of breath or sputum production. The current episode started in the past 7 days. The problem occurs constantly. The problem has been gradually worsening. Associated symptoms include dyspnea on exertion. Pertinent negatives include no appetite change, chest pain, ear  congestion, ear pain, fever, headaches, heartburn, malaise/fatigue, myalgias, nasal congestion, orthopnea, PND, postnasal drip, rhinorrhea, sneezing, sore throat, sweats, trouble swallowing or weight loss. His symptoms are aggravated by exercise, strenuous activity and climbing stairs. His symptoms are alleviated by beta-agonist. He reports minimal improvement on treatment. His past medical history is significant for asthma.     Relevant past medical, surgical, family, and social history reviewed and updated as indicated.  Allergies and medications reviewed and updated.   Past Medical History:  Diagnosis Date  . Allergic rhinitis   . Allergy   . Asthma   . BPH (benign prostatic hypertrophy)   . Depression   . GERD (gastroesophageal reflux disease)   . HTN (hypertension)   . Hyperlipemia   . Migraines     Past Surgical History:  Procedure Laterality Date  . INGUINAL HERNIA REPAIR Bilateral 02/07/2016   Procedure: LAPAROSCOPIC BILATERAL INGUINAL HERNIA REPAIR WITH UMBILICIAL HERNIA REPAIR;  Surgeon: Coralie Keens, MD;  Location: [facility];  Service: General;  Laterality: Bilateral;  . INSERTION OF MESH Bilateral 02/07/2016   Procedure: INSERTION OF MESH TO INGUINAL HERNIAS;  Surgeon: Coralie Keens, MD;  Location: [facility];  Service: General;  Laterality: Bilateral;  . KNEE ARTHROSCOPY Left     Social History   Socioeconomic History  . Marital status: Divorced    Spouse name: Not on file  . Number of children: Not on file  . Years of education: Not on file  . Highest education level: Not on file  Occupational History  . Occupation: full time    Employer: [employer]  . Emergency planning/management officer  strain: Not on file  . Food insecurity    Worry: Never true    Inability: Never true  . Transportation needs    Medical: No    Non-medical: No  Tobacco Use  . Smoking status: Never Smoker  . Smokeless tobacco: Never Used  Substance and Sexual Activity  .  Alcohol use: No    Alcohol/week: 2.0 standard drinks    Types: 2 Cans of beer per week    Comment: rare  . Drug use: No  . Sexual activity: Not on file  Lifestyle  . Physical activity    Days per week: 3 days    Minutes per session: 60 min  . Stress: Not at all  Relationships  . Social connections    Talks on phone: More than three times a week    Gets together: Three times a week    Attends religious service: More than 4 times per year    Active member of club or organization: Yes    Attends meetings of clubs or organizations: More than 4 times per year    Relationship status: Divorced  . Intimate partner violence    Fear of current or ex partner: Not on file    Emotionally abused: Not on file    Physically abused: Not on file    Forced sexual activity: Not on file  Other Topics Concern  . Not on file  Social History Narrative  . Not on file    Outpatient Encounter Medications as of 08/07/2018  Medication Sig  . albuterol (PROAIR HFA) 108 (90 BASE) MCG/ACT inhaler Inhale 2 puffs into the lungs 4 (four) times daily as needed. (Patient taking differently: Inhale 2 puffs into the lungs 4 (four) times daily as needed for wheezing or shortness of breath. )  . aspirin EC 325 MG tablet Take 1 tablet (325 mg total) by mouth daily.  Marland Kitchen atorvastatin (LIPITOR) 40 MG tablet Take 1 tablet (40 mg total) by mouth daily.  . budesonide-formoterol (SYMBICORT) 160-4.5 MCG/ACT inhaler Inhale 2 puffs into the lungs 2 (two) times daily.  . metoprolol succinate (TOPROL-XL) 25 MG 24 hr tablet Take 1 tablet (25 mg total) by mouth daily.  . montelukast (SINGULAIR) 10 MG tablet Take 1 tablet (10 mg total) by mouth at bedtime.  . Naproxen Sodium (ALEVE) 220 MG CAPS Take 220 mg by mouth daily as needed (pain).   Marland Kitchen omeprazole (PRILOSEC) 40 MG capsule TAKE 1 CAPSULE BY MOUTH EVERY DAY  . predniSONE (STERAPRED UNI-PAK 21 TAB) 10 MG (21) TBPK tablet As directed x 6 days  . QUEtiapine (SEROQUEL) 200 MG tablet  TAKE 1 TABLET BY MOUTH EVERYDAY AT BEDTIME  . venlafaxine XR (EFFEXOR-XR) 150 MG 24 hr capsule TAKE 1 CAPSULE EVERY DAY WITH BREAKFAST  . zolpidem (AMBIEN) 10 MG tablet Take 1 tablet (10 mg total) by mouth at bedtime as needed for up to 30 days for sleep.  . [DISCONTINUED] predniSONE (STERAPRED UNI-PAK 21 TAB) 10 MG (21) TBPK tablet As directed x 6 days   No facility-administered encounter medications on file as of 08/07/2018.     Allergies  Allergen Reactions  . No Known Allergies     Review of Systems  Constitutional: Negative for appetite change, fever, malaise/fatigue and weight loss.  HENT: Negative for congestion, ear pain, postnasal drip, rhinorrhea, sneezing, sore throat and trouble swallowing.   Respiratory: Positive for wheezing. Negative for cough, sputum production, chest tightness and shortness of breath.   Cardiovascular: Positive for dyspnea  on exertion. Negative for chest pain, palpitations and PND.  Gastrointestinal: Negative for heartburn.  Musculoskeletal: Negative for myalgias.  Neurological: Negative for dizziness, syncope, weakness, light-headedness and headaches.  Psychiatric/Behavioral: Negative for confusion.  All other systems reviewed and are negative.        Observations/Objective: No vital signs or physical exam, this was a telephone or virtual health encounter.  Pt alert and oriented, answers all questions appropriately, and able to speak in full sentences.    Assessment and Plan: Blake Madden was seen today for asthma.  Diagnoses and all orders for this visit:  Asthma, chronic obstructive, with acute exacerbation (HCC) Increased wheezing without fever, chills, resting shortness of breath, fatigue, or weakness. Avoid triggers. Continue Symbicort. Steroids as prescribed. Report any new or worsening symptoms. Pt aware of symptoms that warrant emergent evaluation and treatment.  -     predniSONE (STERAPRED UNI-PAK 21 TAB) 10 MG (21) TBPK tablet; As  directed x 6 days     Follow Up Instructions: Return if symptoms worsen or fail to improve.    I discussed the assessment and treatment plan with the patient. The patient was provided an opportunity to ask questions and all were answered. The patient agreed with the plan and demonstrated an understanding of the instructions.   The patient was advised to call back or seek an in-person evaluation if the symptoms worsen or if the condition fails to improve as anticipated.  The above assessment and management plan was discussed with the patient. The patient verbalized understanding of and has agreed to the management plan. Patient is aware to call the clinic if symptoms persist or worsen. Patient is aware when to return to the clinic for a follow-up visit. Patient educated on when it is appropriate to go to the emergency department.    I provided 15 minutes of non-face-to-face time during this encounter. The call started at 0925. The call ended at [place]. The other time was used for coordination of care.    Monia Pouch, FNP-C [facility] [address] [place], [place] [address] [phone]

## 2018-08-10 ENCOUNTER — Other Ambulatory Visit: Payer: Self-pay | Admitting: Family

## 2018-08-10 DIAGNOSIS — G4700 Insomnia, unspecified: Secondary | ICD-10-CM

## 2018-08-12 ENCOUNTER — Telehealth: Payer: Self-pay | Admitting: Family

## 2018-08-12 NOTE — Telephone Encounter (Signed)
Wife states that patient also need another round of prednisone

## 2018-08-12 NOTE — Telephone Encounter (Signed)
Wife calling back and wants a call back today>

## 2018-08-12 NOTE — Telephone Encounter (Signed)
Requesting refill of Ambien. Please advise

## 2018-08-13 ENCOUNTER — Other Ambulatory Visit: Payer: Self-pay | Admitting: Family

## 2018-08-13 MED ORDER — PREDNISONE 10 MG (21) PO TBPK: ORAL_TABLET | ORAL | 0 refills | Status: DC

## 2018-08-13 NOTE — Telephone Encounter (Signed)
Aware. 

## 2018-08-13 NOTE — Telephone Encounter (Signed)
Prescription sent to pharmacy.

## 2018-08-14 ENCOUNTER — Telehealth: Payer: Self-pay | Admitting: Family

## 2018-08-14 NOTE — Telephone Encounter (Signed)
Left message for patient to call to revue refills.

## 2018-08-14 NOTE — Telephone Encounter (Signed)
Aware.  Script ready.

## 2018-08-25 ENCOUNTER — Other Ambulatory Visit: Payer: Self-pay | Admitting: Family

## 2018-08-25 DIAGNOSIS — I10 Essential (primary) hypertension: Secondary | ICD-10-CM

## 2018-08-27 ENCOUNTER — Other Ambulatory Visit: Payer: Self-pay | Admitting: Family

## 2018-09-24 ENCOUNTER — Ambulatory Visit (INDEPENDENT_AMBULATORY_CARE_PROVIDER_SITE_OTHER): Payer: Medicare Other | Admitting: Family Medicine

## 2018-09-24 ENCOUNTER — Encounter: Payer: Self-pay | Admitting: Family Medicine

## 2018-09-24 ENCOUNTER — Other Ambulatory Visit: Payer: Self-pay

## 2018-09-24 DIAGNOSIS — J441 Chronic obstructive pulmonary disease with (acute) exacerbation: Secondary | ICD-10-CM | POA: Diagnosis not present

## 2018-09-24 MED ORDER — PREDNISONE 20 MG PO TABS: ORAL_TABLET | ORAL | 0 refills | Status: DC

## 2018-09-24 NOTE — Progress Notes (Signed)
   Virtual Visit via telephone Note  I connected with Blake Kinds. on 09/24/18 at 1707 by telephone and verified that I am speaking with the correct person using two identifiers. Blake Kinds. is currently located at home and no other people are currently with her during visit. The provider, Fransisca Kaufmann Dionta Larke, MD is located in their office at time of visit.  Call ended at 1713  I discussed the limitations, risks, security and privacy concerns of performing an evaluation and management service by telephone and the availability of in person appointments. I also discussed with the patient that there may be a patient responsible charge related to this service. The patient expressed understanding and agreed to proceed.   History and Present Illness: Patient is calling in with complaints of wheezing and breathing issues and he gets like this a few times per year and prednisone usually helps. Patient has a cough and congestion but denies fevers or chills. No covid contacts.   No diagnosis found.    Review of Systems  Constitutional: Negative for chills and fever.  HENT: Positive for congestion. Negative for ear discharge, ear pain, postnasal drip, rhinorrhea, sinus pressure, sneezing, sore throat and voice change.   Eyes: Negative for pain, discharge, redness and visual disturbance.  Respiratory: Positive for cough and wheezing. Negative for chest tightness and shortness of breath.   Cardiovascular: Negative for chest pain and leg swelling.  Gastrointestinal: Negative for abdominal pain, constipation and diarrhea.  Genitourinary: Negative for difficulty urinating.  Musculoskeletal: Negative for back pain, gait problem and myalgias.  Skin: Negative for rash.  Neurological: Negative for syncope, light-headedness and headaches.  All other systems reviewed and are negative.   Observations/Objective: Patient sounds comfortable and in no acute distress  Assessment and Plan: Problem  List Items Addressed This Visit    None    Visit Diagnoses    COPD exacerbation ([place])    -  Primary   Relevant Medications   predniSONE (DELTASONE) 20 MG tablet       Follow Up Instructions:  As needed   I discussed the assessment and treatment plan with the patient. The patient was provided an opportunity to ask questions and all were answered. The patient agreed with the plan and demonstrated an understanding of the instructions.   The patient was advised to call back or seek an in-person evaluation if the symptoms worsen or if the condition fails to improve as anticipated.  The above assessment and management plan was discussed with the patient. The patient verbalized understanding of and has agreed to the management plan. Patient is aware to call the clinic if symptoms persist or worsen. Patient is aware when to return to the clinic for a follow-up visit. Patient educated on when it is appropriate to go to the emergency department.    I provided 6 minutes of non-face-to-face time during this encounter.    Worthy Rancher, MD

## 2018-10-08 ENCOUNTER — Other Ambulatory Visit: Payer: Self-pay | Admitting: Family

## 2018-10-09 ENCOUNTER — Other Ambulatory Visit: Payer: Self-pay | Admitting: Family Medicine

## 2018-10-13 ENCOUNTER — Telehealth: Payer: Self-pay | Admitting: Family

## 2018-10-13 ENCOUNTER — Other Ambulatory Visit: Payer: Self-pay | Admitting: Family

## 2018-10-13 MED ORDER — ZOLPIDEM TARTRATE 10 MG PO TABS: 10.0000 mg | ORAL_TABLET | Freq: Every evening | ORAL | 1 refills | Status: DC | PRN

## 2018-10-13 NOTE — Telephone Encounter (Signed)
Prescription sent to pharmacy. Please schedule him a follow up.

## 2018-10-13 NOTE — Telephone Encounter (Signed)
Patient aware and states he will call back to schedule an appt.

## 2018-10-30 ENCOUNTER — Other Ambulatory Visit: Payer: Self-pay

## 2018-10-30 ENCOUNTER — Telehealth: Payer: Self-pay | Admitting: Family

## 2018-10-30 DIAGNOSIS — E785 Hyperlipidemia, unspecified: Secondary | ICD-10-CM

## 2018-10-30 DIAGNOSIS — F331 Major depressive disorder, recurrent, moderate: Secondary | ICD-10-CM

## 2018-10-30 DIAGNOSIS — K2100 Gastro-esophageal reflux disease with esophagitis, without bleeding: Secondary | ICD-10-CM

## 2018-10-30 DIAGNOSIS — J45901 Unspecified asthma with (acute) exacerbation: Secondary | ICD-10-CM

## 2018-10-30 DIAGNOSIS — Z0000 Encounter for general adult medical examination without abnormal findings: Secondary | ICD-10-CM

## 2018-10-30 DIAGNOSIS — G4700 Insomnia, unspecified: Secondary | ICD-10-CM

## 2018-10-30 DIAGNOSIS — N401 Enlarged prostate with lower urinary tract symptoms: Secondary | ICD-10-CM

## 2018-10-30 DIAGNOSIS — I10 Essential (primary) hypertension: Secondary | ICD-10-CM

## 2018-10-30 DIAGNOSIS — J441 Chronic obstructive pulmonary disease with (acute) exacerbation: Secondary | ICD-10-CM

## 2018-10-30 NOTE — Telephone Encounter (Signed)
Labs ordered.

## 2018-11-03 ENCOUNTER — Other Ambulatory Visit: Payer: Self-pay

## 2018-11-03 ENCOUNTER — Ambulatory Visit (INDEPENDENT_AMBULATORY_CARE_PROVIDER_SITE_OTHER): Payer: Medicare Other | Admitting: Family

## 2018-11-03 ENCOUNTER — Encounter: Payer: Self-pay | Admitting: Family

## 2018-11-03 ENCOUNTER — Other Ambulatory Visit: Payer: Self-pay | Admitting: *Deleted

## 2018-11-03 VITALS — BP 130/83 | HR 79 | Temp 98.7°F | Ht 67.0 in | Wt 158.8 lb

## 2018-11-03 DIAGNOSIS — N401 Enlarged prostate with lower urinary tract symptoms: Secondary | ICD-10-CM

## 2018-11-03 DIAGNOSIS — F331 Major depressive disorder, recurrent, moderate: Secondary | ICD-10-CM | POA: Diagnosis not present

## 2018-11-03 DIAGNOSIS — Z79899 Other long term (current) drug therapy: Secondary | ICD-10-CM | POA: Insufficient documentation

## 2018-11-03 DIAGNOSIS — J45901 Unspecified asthma with (acute) exacerbation: Secondary | ICD-10-CM

## 2018-11-03 DIAGNOSIS — G4700 Insomnia, unspecified: Secondary | ICD-10-CM

## 2018-11-03 DIAGNOSIS — Z23 Encounter for immunization: Secondary | ICD-10-CM | POA: Diagnosis not present

## 2018-11-03 DIAGNOSIS — Z0000 Encounter for general adult medical examination without abnormal findings: Secondary | ICD-10-CM

## 2018-11-03 DIAGNOSIS — I10 Essential (primary) hypertension: Secondary | ICD-10-CM

## 2018-11-03 DIAGNOSIS — E785 Hyperlipidemia, unspecified: Secondary | ICD-10-CM

## 2018-11-03 DIAGNOSIS — J441 Chronic obstructive pulmonary disease with (acute) exacerbation: Secondary | ICD-10-CM

## 2018-11-03 DIAGNOSIS — K2100 Gastro-esophageal reflux disease with esophagitis, without bleeding: Secondary | ICD-10-CM | POA: Diagnosis not present

## 2018-11-03 MED ORDER — ZOLPIDEM TARTRATE 10 MG PO TABS: 10.0000 mg | ORAL_TABLET | Freq: Every evening | ORAL | 5 refills | Status: DC | PRN

## 2018-11-03 NOTE — Addendum Note (Signed)
Addended by: Earlene Plater on: 11/03/2018 04:07 PM   Modules accepted: Orders

## 2018-11-03 NOTE — Addendum Note (Signed)
Addended by: Evelina Dun A on: 11/03/2018 03:33 PM   Modules accepted: Orders

## 2018-11-03 NOTE — Patient Instructions (Signed)

## 2018-11-03 NOTE — Progress Notes (Signed)
Subjective:    Patient ID: Blake Kinds., male    DOB: 07-24-1945, 73 y.o.   MRN: [id number]  Chief Complaint  Patient presents with  . Medical Management of Chronic Issues   PT presents to the office today for chronic follow up.  Gastroesophageal Reflux He complains of coughing, heartburn and a hoarse voice. He reports no belching or no wheezing. This is a chronic problem. The current episode started more than 1 year ago. The problem occurs occasionally. The problem has been waxing and waning. The symptoms are aggravated by certain foods. He has tried a PPI for the symptoms.  Asthma He complains of cough and hoarse voice. There is no shortness of breath or wheezing. This is a chronic problem. The current episode started more than 1 year ago. The problem occurs intermittently. The problem has been waxing and waning. Associated symptoms include heartburn. His symptoms are alleviated by rest. His past medical history is significant for asthma.  Benign Prostatic Hypertrophy This is a chronic problem. The current episode started more than 1 year ago. The problem has been resolved since onset. Irritative symptoms do not include nocturia.  Insomnia Primary symptoms: difficulty falling asleep, frequent awakening.  The current episode started more than one year. The onset quality is gradual. The problem occurs intermittently. PMH includes: depression.  Hyperlipidemia This is a chronic problem. The current episode started more than 1 year ago. The problem is controlled. Recent lipid tests were reviewed and are normal. Pertinent negatives include no shortness of breath. Current antihyperlipidemic treatment includes statins. The current treatment provides moderate improvement of lipids. Risk factors for coronary artery disease include dyslipidemia, male sex and a sedentary lifestyle.  Depression        This is a chronic problem.  The current episode started more than 1 year ago.   The onset quality  is gradual.   The problem occurs intermittently.  Associated symptoms include insomnia and irritable.  Associated symptoms include no helplessness and no hopelessness.  Past treatments include other medications.     Review of Systems  HENT: Positive for hoarse voice.   Respiratory: Positive for cough. Negative for shortness of breath and wheezing.   Gastrointestinal: Positive for heartburn.  Genitourinary: Negative for nocturia.  Psychiatric/Behavioral: Positive for depression. The patient has insomnia.   All other systems reviewed and are negative.      Objective:   Physical Exam Vitals signs reviewed.  Constitutional:      General: He is irritable. He is not in acute distress.    Appearance: He is well-developed.  HENT:     Head: Normocephalic.     Right Ear: Tympanic membrane normal.     Left Ear: Tympanic membrane normal.  Eyes:     General:        Right eye: No discharge.        Left eye: No discharge.     Pupils: Pupils are equal, round, and reactive to light.  Neck:     Musculoskeletal: Normal range of motion and neck supple.     Thyroid: No thyromegaly.  Cardiovascular:     Rate and Rhythm: Normal rate and regular rhythm.     Heart sounds: Normal heart sounds. No murmur.  Pulmonary:     Effort: Pulmonary effort is normal. No respiratory distress.     Breath sounds: Normal breath sounds. No wheezing.  Abdominal:     General: Bowel sounds are normal. There is no distension.  Palpations: Abdomen is soft.     Tenderness: There is no abdominal tenderness.  Musculoskeletal: Normal range of motion.        General: No tenderness.  Skin:    General: Skin is warm and dry.     Findings: No erythema or rash.  Neurological:     Mental Status: He is alert and oriented to person, place, and time.     Cranial Nerves: No cranial nerve deficit.     Deep Tendon Reflexes: Reflexes are normal and symmetric.  Psychiatric:        Behavior: Behavior normal.        Thought  Content: Thought content normal.        Judgment: Judgment normal.       BP 130/83   Pulse 79   Temp 98.7 F (37.1 C) (Temporal)   Ht 5\' 7"  (1.702 m)   Wt 158 lb 12.8 oz (72 kg)   SpO2 95%   BMI 24.87 kg/m      Assessment & Plan:  Blake Kinds. comes in today with chief complaint of Medical Management of Chronic Issues   Diagnosis and orders addressed:  1. Gastroesophageal reflux disease with esophagitis without hemorrhage  2. Asthma, chronic obstructive, with acute exacerbation ([place])  3. Benign prostatic hyperplasia with lower urinary tract symptoms, symptom details unspecified  4. Insomnia, unspecified type - zolpidem (AMBIEN) 10 MG tablet; Take 1 tablet (10 mg total) by mouth at bedtime as needed for sleep.  Dispense: 30 tablet; Refill: 5  5. Controlled substance agreement signed  6. Moderate episode of recurrent major depressive disorder ([place])  7. Hyperlipidemia, unspecified hyperlipidemia type  8. Essential hypertension    Labs pending (labs drawn this AM)  Health Maintenance reviewed Diet and exercise encouraged  Follow up plan: 6 months    Evelina Dun, FNP

## 2018-11-04 LAB — CBC WITH DIFFERENTIAL/PLATELET
Basophils Absolute: 0.1 10*3/uL (ref 0.0–0.2)
Basos: 2 %
EOS (ABSOLUTE): 0.6 10*3/uL — ABNORMAL HIGH (ref 0.0–0.4)
Eos: 10 %
Hematocrit: 43.3 % (ref 37.5–51.0)
Hemoglobin: 14.7 g/dL (ref 13.0–17.7)
Immature Grans (Abs): 0 10*3/uL (ref 0.0–0.1)
Immature Granulocytes: 1 %
Lymphocytes Absolute: 2.1 10*3/uL (ref 0.7–3.1)
Lymphs: 33 %
MCH: 30.9 pg (ref 26.6–33.0)
MCHC: 33.9 g/dL (ref 31.5–35.7)
MCV: 91 fL (ref 79–97)
Monocytes Absolute: 0.7 10*3/uL (ref 0.1–0.9)
Monocytes: 11 %
Neutrophils Absolute: 2.7 10*3/uL (ref 1.4–7.0)
Neutrophils: 43 %
Platelets: 322 10*3/uL (ref 150–450)
RBC: 4.76 x10E6/uL (ref 4.14–5.80)
RDW: 13 % (ref 11.6–15.4)
WBC: 6.2 10*3/uL (ref 3.4–10.8)

## 2018-11-04 LAB — CMP14+EGFR
ALT: 13 IU/L (ref 0–44)
AST: 18 IU/L (ref 0–40)
Albumin/Globulin Ratio: 2.1 (ref 1.2–2.2)
Albumin: 4.2 g/dL (ref 3.7–4.7)
Alkaline Phosphatase: 64 IU/L (ref 39–117)
BUN/Creatinine Ratio: 16 (ref 10–24)
BUN: 12 mg/dL (ref 8–27)
Bilirubin Total: 0.5 mg/dL (ref 0.0–1.2)
CO2: 26 mmol/L (ref 20–29)
Calcium: 9.5 mg/dL (ref 8.6–10.2)
Chloride: 106 mmol/L (ref 96–106)
Creatinine, Ser: 0.77 mg/dL (ref 0.76–1.27)
GFR calc Af Amer: 103 mL/min/{1.73_m2} (ref >59)
GFR calc non Af Amer: 89 mL/min/{1.73_m2} (ref >59)
Globulin, Total: 2 g/dL (ref 1.5–4.5)
Glucose: 92 mg/dL (ref 65–99)
Potassium: 4.1 mmol/L (ref 3.5–5.2)
Sodium: 141 mmol/L (ref 134–144)
Total Protein: 6.2 g/dL (ref 6.0–8.5)

## 2018-11-04 LAB — PSA, TOTAL AND FREE
PSA, Free Pct: 18.7 %
PSA, Free: 0.43 ng/mL
Prostate Specific Ag, Serum: 2.3 ng/mL (ref 0.0–4.0)

## 2018-11-04 LAB — LIPID PANEL
Chol/HDL Ratio: 5.6 ratio — ABNORMAL HIGH (ref 0.0–5.0)
Cholesterol, Total: 222 mg/dL — ABNORMAL HIGH (ref 100–199)
HDL: 40 mg/dL (ref >39)
LDL Chol Calc (NIH): 148 mg/dL — ABNORMAL HIGH (ref 0–99)
Triglycerides: 189 mg/dL — ABNORMAL HIGH (ref 0–149)
VLDL Cholesterol Cal: 34 mg/dL (ref 5–40)

## 2018-11-04 LAB — TSH: TSH: 2.06 u[IU]/mL (ref 0.450–4.500)

## 2018-11-05 ENCOUNTER — Other Ambulatory Visit: Payer: Self-pay | Admitting: Family

## 2018-11-05 MED ORDER — ATORVASTATIN CALCIUM 20 MG PO TABS: 20.0000 mg | ORAL_TABLET | Freq: Every day | ORAL | 3 refills | Status: DC

## 2018-11-05 NOTE — Telephone Encounter (Signed)
Patient had an appointment on 10/6- this encounter will be closed.

## 2018-11-06 LAB — TOXASSURE SELECT 13 (MW), URINE

## 2018-11-10 ENCOUNTER — Ambulatory Visit (INDEPENDENT_AMBULATORY_CARE_PROVIDER_SITE_OTHER): Payer: Medicare Other | Admitting: *Deleted

## 2018-11-10 ENCOUNTER — Other Ambulatory Visit: Payer: Self-pay

## 2018-11-10 DIAGNOSIS — Z0000 Encounter for general adult medical examination without abnormal findings: Secondary | ICD-10-CM | POA: Diagnosis not present

## 2018-11-10 NOTE — Progress Notes (Addendum)
MEDICARE ANNUAL WELLNESS VISIT  11/10/2018  Telephone Visit Disclaimer This Medicare AWV was conducted by telephone due to national recommendations for restrictions regarding the COVID-19 Pandemic (e.g. social distancing).  I verified, using two identifiers, that I am speaking with Blake Madden. or their authorized healthcare agent. I discussed the limitations, risks, security, and privacy concerns of performing an evaluation and management service by telephone and the potential availability of an in-person appointment in the future. The patient expressed understanding and agreed to proceed.   Subjective:  Blake Madden. is a 73 y.o. male patient of Hawks, Theador Hawthorne, FNP who had a Medicare Annual Wellness Visit today via telephone. Loid is Working full time and lives alone. he has 3 children. he reports that he is socially active and does interact with friends/family regularly. he is moderately physically active and enjoys fishing and golfing.  Patient Care Team: Sharion Balloon, FNP as PCP - General (Family Medicine)  Advanced Directives 11/10/2018 10/01/2017 07/01/2017 11/17/2016 02/01/2016 04/25/2015  Does Patient Have a Medical Advance Directive? No No No No No No  Would patient like information on creating a medical advance directive? No - Patient declined No - Patient declined - - Yes (MAU/Ambulatory/Procedural Areas - Information given) -    Hospital Utilization Over the Past 12 Months: # of hospitalizations or ER visits: 0 # of surgeries: 0  Review of Systems    Patient reports that his overall health is better compared to last year.  History obtained from chart review and patient  Patient Reported Readings (BP, Pulse, CBG, Weight, etc) none  Pain Assessment Pain : No/denies pain     Current Medications & Allergies (verified) Allergies as of 11/10/2018      Reactions   No Known Allergies       Medication List       Accurate as of November 10, 2018   1:52 PM. If you have any questions, ask your nurse or doctor.        albuterol 108 (90 Base) MCG/ACT inhaler Commonly known as: ProAir HFA Inhale 2 puffs into the lungs 4 (four) times daily as needed. What changed: reasons to take this   atorvastatin 20 MG tablet Commonly known as: LIPITOR Take 1 tablet (20 mg total) by mouth daily.   budesonide-formoterol 160-4.5 MCG/ACT inhaler Commonly known as: SYMBICORT Inhale 2 puffs into the lungs 2 (two) times daily.   omeprazole 40 MG capsule Commonly known as: PRILOSEC TAKE 1 CAPSULE BY MOUTH EVERY DAY   QUEtiapine 200 MG tablet Commonly known as: SEROQUEL TAKE 1 TABLET BY MOUTH EVERYDAY AT BEDTIME   zolpidem 10 MG tablet Commonly known as: AMBIEN Take 1 tablet (10 mg total) by mouth at bedtime as needed for sleep.       History (reviewed): Past Medical History:  Diagnosis Date  . Allergic rhinitis   . Allergy   . Asthma   . BPH (benign prostatic hypertrophy)   . Depression   . GERD (gastroesophageal reflux disease)   . HTN (hypertension)   . Hyperlipemia   . Migraines    Past Surgical History:  Procedure Laterality Date  . INGUINAL HERNIA REPAIR Bilateral 02/07/2016   Procedure: LAPAROSCOPIC BILATERAL INGUINAL HERNIA REPAIR WITH UMBILICIAL HERNIA REPAIR;  Surgeon: Coralie Keens, MD;  Location: [facility];  Service: General;  Laterality: Bilateral;  . INSERTION OF MESH Bilateral 02/07/2016   Procedure: INSERTION OF MESH TO INGUINAL HERNIAS;  Surgeon: Coralie Keens, MD;  Location:  [facility];  Service: General;  Laterality: Bilateral;  . KNEE ARTHROSCOPY Left    Family History  Problem Relation Age of Onset  . Lung cancer Father        lung  . Bladder Cancer Father   . Colon cancer Sister   . Colon cancer Brother    Social History   Socioeconomic History  . Marital status: Divorced    Spouse name: Not on file  . Number of children: 3  . Years of education: 50  . Highest education level: 12th grade  Occupational  History  . Occupation: full time    Employer: [employer]  . Financial resource strain: Not hard at all  . Food insecurity    Worry: Not on file    Inability: Not on file  . Transportation needs    Medical: Not on file    Non-medical: Not on file  Tobacco Use  . Smoking status: Never Smoker  . Smokeless tobacco: Never Used  Substance and Sexual Activity  . Alcohol use: No    Alcohol/week: 2.0 standard drinks    Types: 2 Cans of beer per week    Comment: rare  . Drug use: No  . Sexual activity: Not on file  Lifestyle  . Physical activity    Days per week: 3 days    Minutes per session: 30 min  . Stress: Not on file  Relationships  . Social Herbalist on phone: Three times a week    Gets together: Once a week    Attends religious service: More than 4 times per year    Active member of club or organization: No    Attends meetings of clubs or organizations: Not on file    Relationship status: Divorced  Other Topics Concern  . Not on file  Social History Narrative  . Not on file    Activities of Daily Living In your present state of health, do you have any difficulty performing the following activities: 11/10/2018  Hearing? N  Vision? N  Difficulty concentrating or making decisions? N  Walking or climbing stairs? N  Dressing or bathing? N  Doing errands, shopping? N  Preparing Food and eating ? N  Using the Toilet? N  In the past six months, have you accidently leaked urine? N  Do you have problems with loss of bowel control? N  Managing your Medications? N  Managing your Finances? N  Housekeeping or managing your Housekeeping? N  Some recent data might be hidden    Patient Education/ Literacy How often do you need to have someone help you when you read instructions, pamphlets, or other written materials from your doctor or pharmacy?: 1 - Never What is the last grade level you completed in school?: 12  Exercise Current  Exercise Habits: The patient has a physically strenuous job, but has no regular exercise apart from work.  Diet Patient reports consuming 2 meals a day and 1 snack(s) a day Patient reports that his primary diet is: Regular Patient reports that she does have regular access to food.   Depression Screen PHQ 2/9 Scores 11/10/2018 11/03/2018 11/03/2018 12/15/2017 10/01/2017 09/04/2017 03/21/2017  PHQ - 2 Score 0 0 0 0 0 2 0  PHQ- 9 Score - 0 - - - 3 -     Fall Risk Fall Risk  11/10/2018 11/03/2018 12/15/2017 10/01/2017 09/04/2017  Falls in the past year? 0 0 0 No No  Number  falls in past yr: - - - - -  Injury with Fall? - - - - -  Comment - - - - -     Objective:  Blake Madden. seemed alert and oriented and he participated appropriately during our telephone visit.  Blood Pressure Weight BMI  BP Readings from Last 3 Encounters:  11/03/18 130/83  12/15/17 137/82  10/01/17 117/72   Wt Readings from Last 3 Encounters:  11/03/18 158 lb 12.8 oz (72 kg)  12/15/17 157 lb (71.2 kg)  10/01/17 164 lb (74.4 kg)   BMI Readings from Last 1 Encounters:  11/03/18 24.87 kg/m    *Unable to obtain current vital signs, weight, and BMI due to telephone visit type  Hearing/Vision  . Narain did not seem to have difficulty with hearing/understanding during the telephone conversation . Reports that he has not had a formal eye exam by an eye care professional within the past year . Reports that he has not had a formal hearing evaluation within the past year *Unable to fully assess hearing and vision during telephone visit type  Cognitive Function: 6CIT Screen 11/10/2018  What Year? 0 points  What month? 0 points  What time? 0 points  Count back from 20 0 points  Months in reverse 0 points  Repeat phrase 2 points  Total Score 2   (Normal:0-7, Significant for Dysfunction: >8)  Normal Cognitive Function Screening: Yes   Immunization & Health Maintenance Record Immunization History  Administered  Date(s) Administered  . Fluad Quad(high Dose 65+) 11/03/2018  . Influenza, High Dose Seasonal PF 02/01/2017  . Influenza,inj,Quad PF,6+ Mos 03/09/2014, 12/19/2014  . Pneumococcal Conjugate-13 03/09/2014  . Pneumococcal Polysaccharide-23 07/08/2016    Health Maintenance  Topic Date Due  . TETANUS/TDAP  10/01/1964  . INFLUENZA VACCINE  Completed  . Hepatitis C Screening  Completed  . PNA vac Low Risk Adult  Completed       Assessment  This is a routine wellness examination for Blake Madden.Marland Kitchen  Health Maintenance: Due or Overdue Health Maintenance Due  Topic Date Due  . Samul Dada  10/01/1964    Blake Madden. does not need a referral for Community Assistance: Care Management:   no Social Work:    no Prescription Assistance:  no Nutrition/Diabetes Education:  no   Plan:  Personalized Goals Goals Addressed            This Visit's Progress   . COMPLETED: Have 3 meals a day      . prevent falls        Personalized Health Maintenance & Screening Recommendations  Td vaccine  Lung Cancer Screening Recommended: no (Low Dose CT Chest recommended if Age 69-80 years, 30 pack-year currently smoking OR have quit w/in past 15 years) Hepatitis C Screening recommended: no HIV Screening recommended: no  Advanced Directives: Written information was not prepared per patient's request.  Referrals & Orders No orders of the defined types were placed in this encounter.   Follow-up Plan . Follow-up with Sharion Balloon, FNP as planned  . Pt is in overall good health. He feels better this year than last because of 2 knee surgeries in May of 2019. He is due a tetanus vaccine and will discuss this at his next visit. Patient has no trouble with hearing or vision. He still works full time, but will retire in next 6 months.   I have personally reviewed and noted the following in the patient's chart:   .  Medical and social history . Use of alcohol, tobacco or illicit drugs   . Current medications and supplements . Functional ability and status . Nutritional status . Physical activity . Advanced directives . List of other physicians . Hospitalizations, surgeries, and ER visits in previous 12 months . Vitals . Screenings to include cognitive, depression, and falls . Referrals and appointments  In addition, I have reviewed and discussed with Blake Madden. certain preventive protocols, quality metrics, and best practice recommendations. A written personalized care plan for preventive services as well as general preventive health recommendations is available and can be mailed to the patient at his request.      Rana Snare, LPN  [id number]   I have reviewed and agree with the above AWV documentation.   Evelina Dun, FNP

## 2018-11-18 ENCOUNTER — Ambulatory Visit (INDEPENDENT_AMBULATORY_CARE_PROVIDER_SITE_OTHER): Payer: Medicare Other | Admitting: Family

## 2018-11-18 ENCOUNTER — Encounter: Payer: Self-pay | Admitting: Family

## 2018-11-18 ENCOUNTER — Other Ambulatory Visit: Payer: Self-pay

## 2018-11-18 DIAGNOSIS — J441 Chronic obstructive pulmonary disease with (acute) exacerbation: Secondary | ICD-10-CM

## 2018-11-18 DIAGNOSIS — J45901 Unspecified asthma with (acute) exacerbation: Secondary | ICD-10-CM | POA: Diagnosis not present

## 2018-11-18 MED ORDER — MONTELUKAST SODIUM 10 MG PO TABS: 10.0000 mg | ORAL_TABLET | Freq: Every day | ORAL | 3 refills | Status: DC

## 2018-11-18 MED ORDER — PREDNISONE 10 MG (21) PO TBPK: ORAL_TABLET | ORAL | 0 refills | Status: DC

## 2018-11-18 NOTE — Progress Notes (Signed)
   Virtual Visit via telephone Note Due to COVID-19 pandemic this visit was conducted virtually. This visit type was conducted due to national recommendations for restrictions regarding the COVID-19 Pandemic (e.g. social distancing, sheltering in place) in an effort to limit this patient's exposure and mitigate transmission in our community. All issues noted in this document were discussed and addressed.  A physical exam was not performed with this format.  I connected with Blake Madden. on 11/18/18 at 11:13 AM by telephone and verified that I am speaking with the correct person using two identifiers. Blake Madden. is currently located at work  and no one  is currently with him during visit. The provider, Evelina Dun, FNP is located in their office at time of visit.  I discussed the limitations, risks, security and privacy concerns of performing an evaluation and management service by telephone and the availability of in person appointments. I also discussed with the patient that there may be a patient responsible charge related to this service. The patient expressed understanding and agreed to proceed.   History and Present Illness:  Asthma He complains of chest tightness, cough and wheezing. This is a recurrent problem. The current episode started in the past 7 days. The problem occurs intermittently. The problem has been waxing and waning. The cough is non-productive. Associated symptoms include malaise/fatigue. Pertinent negatives include no dyspnea on exertion, ear congestion, ear pain, fever, sore throat, sweats or trouble swallowing. His symptoms are aggravated by pollen. His symptoms are alleviated by rest and OTC inhaler. He reports moderate improvement on treatment. His past medical history is significant for asthma.      Review of Systems  Constitutional: Positive for malaise/fatigue. Negative for fever.  HENT: Negative for ear pain, sore throat and trouble swallowing.    Respiratory: Positive for cough and wheezing.   Cardiovascular: Negative for dyspnea on exertion.     Observations/Objective: No SOB or distress noted   Assessment and Plan: 1. Asthma, chronic obstructive, with acute exacerbation ([place]) Continue Zyrtec daily Start Singulair  Pt can not afford Symbicort Avoid allergens when possible  Call if symptoms worsen or do not improve  - montelukast (SINGULAIR) 10 MG tablet; Take 1 tablet (10 mg total) by mouth at bedtime.  Dispense: 90 tablet; Refill: 3 - predniSONE (STERAPRED UNI-PAK 21 TAB) 10 MG (21) TBPK tablet; Use as directed  Dispense: 21 tablet; Refill: 0     I discussed the assessment and treatment plan with the patient. The patient was provided an opportunity to ask questions and all were answered. The patient agreed with the plan and demonstrated an understanding of the instructions.   The patient was advised to call back or seek an in-person evaluation if the symptoms worsen or if the condition fails to improve as anticipated.  The above assessment and management plan was discussed with the patient. The patient verbalized understanding of and has agreed to the management plan. Patient is aware to call the clinic if symptoms persist or worsen. Patient is aware when to return to the clinic for a follow-up visit. Patient educated on when it is appropriate to go to the emergency department.   Time call ended: 11:22 AM   I provided 9 minutes of non-face-to-face time during this encounter.    Evelina Dun, FNP

## 2018-11-19 ENCOUNTER — Other Ambulatory Visit: Payer: Self-pay | Admitting: Family

## 2018-11-19 DIAGNOSIS — I10 Essential (primary) hypertension: Secondary | ICD-10-CM

## 2018-12-14 ENCOUNTER — Encounter: Payer: Self-pay | Admitting: Family Medicine

## 2018-12-14 ENCOUNTER — Ambulatory Visit (INDEPENDENT_AMBULATORY_CARE_PROVIDER_SITE_OTHER): Payer: Medicare Other | Admitting: Family Medicine

## 2018-12-14 DIAGNOSIS — J441 Chronic obstructive pulmonary disease with (acute) exacerbation: Secondary | ICD-10-CM

## 2018-12-14 MED ORDER — PREDNISONE 20 MG PO TABS: ORAL_TABLET | ORAL | 0 refills | Status: DC

## 2018-12-14 MED ORDER — AZITHROMYCIN 250 MG PO TABS: ORAL_TABLET | ORAL | 0 refills | Status: DC

## 2018-12-14 MED ORDER — BUDESONIDE 0.5 MG/2ML IN SUSP: 0.5000 mg | Freq: Two times a day (BID) | RESPIRATORY_TRACT | 5 refills | Status: DC

## 2018-12-14 NOTE — Progress Notes (Signed)
Virtual Visit via telephone Note  I connected with Blake Kinds. on 12/14/18 at 1014 by telephone and verified that I am speaking with the correct person using two identifiers. Blake Kinds. is currently located at home and no other people are currently with her during visit. The provider, Fransisca Kaufmann , MD is located in their office at time of visit.  Call ended at 1025  I discussed the limitations, risks, security and privacy concerns of performing an evaluation and management service by telephone and the availability of in person appointments. I also discussed with the patient that there may be a patient responsible charge related to this service. The patient expressed understanding and agreed to proceed.   History and Present Illness: Patient is having congestion and wheezing and cough is productive.  He is using mucinex with minimal relief. He denies fevers or chills.  He is having wheezing and exertional shortness of breath.  He denies covid contacts. He is still using Symbicort and albuterol but doesn't  Have symbicort now.  He is using albuterol and it helps some.   No diagnosis found.  Outpatient Encounter Medications as of 12/14/2018  Medication Sig  . albuterol (PROAIR HFA) 108 (90 BASE) MCG/ACT inhaler Inhale 2 puffs into the lungs 4 (four) times daily as needed. (Patient taking differently: Inhale 2 puffs into the lungs 4 (four) times daily as needed for wheezing or shortness of breath. )  . atorvastatin (LIPITOR) 20 MG tablet Take 1 tablet (20 mg total) by mouth daily.  . budesonide-formoterol (SYMBICORT) 160-4.5 MCG/ACT inhaler Inhale 2 puffs into the lungs 2 (two) times daily.  . montelukast (SINGULAIR) 10 MG tablet Take 1 tablet (10 mg total) by mouth at bedtime.  Marland Kitchen omeprazole (PRILOSEC) 40 MG capsule TAKE 1 CAPSULE BY MOUTH EVERY DAY  . predniSONE (STERAPRED UNI-PAK 21 TAB) 10 MG (21) TBPK tablet Use as directed  . QUEtiapine (SEROQUEL) 200 MG tablet TAKE 1  TABLET BY MOUTH EVERYDAY AT BEDTIME  . zolpidem (AMBIEN) 10 MG tablet Take 1 tablet (10 mg total) by mouth at bedtime as needed for sleep.   No facility-administered encounter medications on file as of 12/14/2018.     Review of Systems  Constitutional: Negative for chills and fever.  HENT: Positive for congestion, postnasal drip, rhinorrhea, sinus pressure, sneezing and sore throat. Negative for ear discharge, ear pain and voice change.   Eyes: Negative for pain, discharge, redness and visual disturbance.  Respiratory: Positive for cough, shortness of breath and wheezing.   Cardiovascular: Negative for chest pain and leg swelling.  Musculoskeletal: Negative for gait problem and myalgias.  Skin: Negative for rash.  All other systems reviewed and are negative.   Observations/Objective: Patient sounds comfortable and in no acute distress  Assessment and Plan: Problem List Items Addressed This Visit    None    Visit Diagnoses    COPD exacerbation ([place])    -  Primary   Relevant Medications   predniSONE (DELTASONE) 20 MG tablet   budesonide (PULMICORT) 0.5 MG/2ML nebulizer solution   azithromycin (ZITHROMAX) 250 MG tablet       Follow Up Instructions: Follow-up as needed, recommended to do the budesonide twice daily because he cannot get the Symbicort right now until he can get the Symbicort.  We will treat like COPD exacerbation, patient does not want to go for Covid testing    I discussed the assessment and treatment plan with the patient. The patient was provided an  opportunity to ask questions and all were answered. The patient agreed with the plan and demonstrated an understanding of the instructions.   The patient was advised to call back or seek an in-person evaluation if the symptoms worsen or if the condition fails to improve as anticipated.  The above assessment and management plan was discussed with the patient. The patient verbalized understanding of and has agreed to  the management plan. Patient is aware to call the clinic if symptoms persist or worsen. Patient is aware when to return to the clinic for a follow-up visit. Patient educated on when it is appropriate to go to the emergency department.    I provided 11 minutes of non-face-to-face time during this encounter.    Worthy Rancher, MD

## 2019-01-11 ENCOUNTER — Other Ambulatory Visit: Payer: Self-pay | Admitting: Family Medicine

## 2019-01-11 MED ORDER — QUETIAPINE FUMARATE 200 MG PO TABS: ORAL_TABLET | ORAL | 0 refills | Status: DC

## 2019-01-15 ENCOUNTER — Other Ambulatory Visit: Payer: Self-pay | Admitting: Family Medicine

## 2019-01-15 DIAGNOSIS — J441 Chronic obstructive pulmonary disease with (acute) exacerbation: Secondary | ICD-10-CM

## 2019-01-24 ENCOUNTER — Other Ambulatory Visit: Payer: Self-pay | Admitting: Family

## 2019-01-24 DIAGNOSIS — I10 Essential (primary) hypertension: Secondary | ICD-10-CM

## 2019-01-31 DIAGNOSIS — J45901 Unspecified asthma with (acute) exacerbation: Secondary | ICD-10-CM | POA: Diagnosis not present

## 2019-01-31 DIAGNOSIS — J209 Acute bronchitis, unspecified: Secondary | ICD-10-CM | POA: Diagnosis not present

## 2019-02-11 ENCOUNTER — Other Ambulatory Visit: Payer: Self-pay | Admitting: Family

## 2019-02-19 ENCOUNTER — Encounter: Payer: Self-pay | Admitting: Nurse Practitioner

## 2019-02-19 ENCOUNTER — Ambulatory Visit (INDEPENDENT_AMBULATORY_CARE_PROVIDER_SITE_OTHER): Payer: Medicare Other | Admitting: Nurse Practitioner

## 2019-02-19 DIAGNOSIS — J069 Acute upper respiratory infection, unspecified: Secondary | ICD-10-CM | POA: Diagnosis not present

## 2019-02-19 MED ORDER — BENZONATATE 100 MG PO CAPS: 100.0000 mg | ORAL_CAPSULE | Freq: Three times a day (TID) | ORAL | 0 refills | Status: DC | PRN

## 2019-02-19 MED ORDER — PREDNISONE 20 MG PO TABS: ORAL_TABLET | ORAL | 0 refills | Status: DC

## 2019-02-19 MED ORDER — AMOXICILLIN-POT CLAVULANATE 875-125 MG PO TABS: 1.0000 | ORAL_TABLET | Freq: Two times a day (BID) | ORAL | 0 refills | Status: DC

## 2019-02-19 NOTE — Progress Notes (Signed)
Virtual Visit via telephone Note Due to COVID-19 pandemic this visit was conducted virtually. This visit type was conducted due to national recommendations for restrictions regarding the COVID-19 Pandemic (e.g. social distancing, sheltering in place) in an effort to limit this patient's exposure and mitigate transmission in our community. All issues noted in this document were discussed and addressed.  A physical exam was not performed with this format.  I connected with [name]. on 02/19/19 at 2:55 by telephone and verified that I am speaking with the correct person using two identifiers. [name]. is currently located at home and no one is currently with him during visit. The provider, Mary-Margaret Hassell Done, FNP is located in their office at time of visit.  I discussed the limitations, risks, security and privacy concerns of performing an evaluation and management service by telephone and the availability of in person appointments. I also discussed with the patient that there may be a patient responsible charge related to this service. The patient expressed understanding and agreed to proceed.   History and Present Illness:   Chief Complaint: URI   HPI Patient calls in today c/o congestion and cough that started 1 week ago.  He had corona test 2 weeks ago which was negative. His wife is on chemo and he doe snot want vto make her sick. He says he has a tight cough that is productive. He denies fever, body aches or headache.    Review of Systems  Constitutional: Negative for chills and fever.  HENT: Positive for congestion. Negative for sinus pain.   Respiratory: Positive for cough, sputum production and shortness of breath.   Cardiovascular: Negative.   Gastrointestinal: Positive for heartburn.  Musculoskeletal: Negative for myalgias.  Neurological: Positive for headaches.  Psychiatric/Behavioral: Negative.   All other systems reviewed and are  negative.    Observations/Objective: Alert and oriented- answers all questions appropriately No distress Lots of sniffing and cough during televisit Voice is horace  Assessment and Plan: [name]. in today with chief complaint of URI   1. URI with cough and congestion 1. Take meds as prescribed 2. Use a cool mist humidifier especially during the winter months and when heat has been humid. 3. Use saline nose sprays frequently 4. Saline irrigations of the nose can be very helpful if done frequently.  * 4X daily for 1 week*  * Use of a nettie pot can be helpful with this. Follow directions with this* 5. Drink plenty of fluids 6. Keep thermostat turn down low 7.For any cough or congestion  Use plain Mucinex- regular strength or max strength is fine   * Children- consult with Pharmacist for dosing 8. For fever or aces or pains- take tylenol or ibuprofen appropriate for age and weight.  * for fevers greater than 101 orally you may alternate ibuprofen and tylenol every  3 hours.   Meds ordered this encounter  Medications  . amoxicillin-clavulanate (AUGMENTIN) 875-125 MG tablet    Sig: Take 1 tablet by mouth 2 (two) times daily.    Dispense:  14 tablet    Refill:  0    Order Specific Question:   Supervising Provider    Answer:   Caryl Pina A [id number]  . predniSONE (DELTASONE) 20 MG tablet    Sig: 2 po at sametime daily for 5 days    Dispense:  10 tablet    Refill:  0    Order Specific Question:   Supervising Provider  Answer:   Caryl Pina A [id number]  . benzonatate (TESSALON PERLES) 100 MG capsule    Sig: Take 1 capsule (100 mg total) by mouth 3 (three) times daily as needed for cough.    Dispense:  20 capsule    Refill:  0    Order Specific Question:   Supervising Provider    Answer:   Caryl Pina A [id number]        Follow Up Instructions: prn    I discussed the assessment and treatment plan with the patient. The patient was provided  an opportunity to ask questions and all were answered. The patient agreed with the plan and demonstrated an understanding of the instructions.   The patient was advised to call back or seek an in-person evaluation if the symptoms worsen or if the condition fails to improve as anticipated.  The above assessment and management plan was discussed with the patient. The patient verbalized understanding of and has agreed to the management plan. Patient is aware to call the clinic if symptoms persist or worsen. Patient is aware when to return to the clinic for a follow-up visit. Patient educated on when it is appropriate to go to the emergency department.   Time call ended:  3:06  I provided 11 minutes of non-face-to-face time during this encounter.    Mary-Margaret Hassell Done, FNP

## 2019-03-23 DIAGNOSIS — J45901 Unspecified asthma with (acute) exacerbation: Secondary | ICD-10-CM | POA: Diagnosis not present

## 2019-03-23 DIAGNOSIS — J209 Acute bronchitis, unspecified: Secondary | ICD-10-CM | POA: Diagnosis not present

## 2019-03-23 DIAGNOSIS — Z1159 Encounter for screening for other viral diseases: Secondary | ICD-10-CM | POA: Diagnosis not present

## 2019-04-04 ENCOUNTER — Other Ambulatory Visit: Payer: Self-pay | Admitting: Family

## 2019-04-18 DIAGNOSIS — R062 Wheezing: Secondary | ICD-10-CM | POA: Diagnosis not present

## 2019-04-18 DIAGNOSIS — J209 Acute bronchitis, unspecified: Secondary | ICD-10-CM | POA: Diagnosis not present

## 2019-04-18 DIAGNOSIS — J45909 Unspecified asthma, uncomplicated: Secondary | ICD-10-CM | POA: Diagnosis not present

## 2019-04-27 LAB — PULMONARY FUNCTION TEST

## 2019-05-08 ENCOUNTER — Other Ambulatory Visit: Payer: Self-pay | Admitting: Family

## 2019-05-12 DIAGNOSIS — H2513 Age-related nuclear cataract, bilateral: Secondary | ICD-10-CM | POA: Diagnosis not present

## 2019-05-12 DIAGNOSIS — H43813 Vitreous degeneration, bilateral: Secondary | ICD-10-CM | POA: Diagnosis not present

## 2019-05-18 ENCOUNTER — Other Ambulatory Visit: Payer: Self-pay | Admitting: Family

## 2019-05-18 DIAGNOSIS — G4700 Insomnia, unspecified: Secondary | ICD-10-CM

## 2019-05-24 ENCOUNTER — Ambulatory Visit (INDEPENDENT_AMBULATORY_CARE_PROVIDER_SITE_OTHER): Payer: Medicare Other | Admitting: Family

## 2019-05-24 ENCOUNTER — Encounter: Payer: Self-pay | Admitting: Family

## 2019-05-24 DIAGNOSIS — Z79899 Other long term (current) drug therapy: Secondary | ICD-10-CM

## 2019-05-24 DIAGNOSIS — G4700 Insomnia, unspecified: Secondary | ICD-10-CM

## 2019-05-24 DIAGNOSIS — J45901 Unspecified asthma with (acute) exacerbation: Secondary | ICD-10-CM

## 2019-05-24 DIAGNOSIS — F331 Major depressive disorder, recurrent, moderate: Secondary | ICD-10-CM

## 2019-05-24 DIAGNOSIS — J441 Chronic obstructive pulmonary disease with (acute) exacerbation: Secondary | ICD-10-CM

## 2019-05-24 DIAGNOSIS — K2100 Gastro-esophageal reflux disease with esophagitis, without bleeding: Secondary | ICD-10-CM

## 2019-05-24 DIAGNOSIS — I10 Essential (primary) hypertension: Secondary | ICD-10-CM

## 2019-05-24 DIAGNOSIS — N401 Enlarged prostate with lower urinary tract symptoms: Secondary | ICD-10-CM

## 2019-05-24 DIAGNOSIS — E785 Hyperlipidemia, unspecified: Secondary | ICD-10-CM

## 2019-05-24 DIAGNOSIS — Z77090 Contact with and (suspected) exposure to asbestos: Secondary | ICD-10-CM

## 2019-05-24 MED ORDER — OMEPRAZOLE 40 MG PO CPDR: DELAYED_RELEASE_CAPSULE | ORAL | 3 refills | Status: DC

## 2019-05-24 MED ORDER — ZOLPIDEM TARTRATE 10 MG PO TABS: 10.0000 mg | ORAL_TABLET | Freq: Every evening | ORAL | 5 refills | Status: DC | PRN

## 2019-05-24 MED ORDER — QUETIAPINE FUMARATE 200 MG PO TABS: 200.0000 mg | ORAL_TABLET | Freq: Every day | ORAL | 3 refills | Status: DC

## 2019-05-24 NOTE — Progress Notes (Signed)
Virtual Visit via telephone Note Due to COVID-19 pandemic this visit was conducted virtually. This visit type was conducted due to national recommendations for restrictions regarding the COVID-19 Pandemic (e.g. social distancing, sheltering in place) in an effort to limit this patient's exposure and mitigate transmission in our community. All issues noted in this document were discussed and addressed.  A physical exam was not performed with this format.  I connected with Blake Madden. on 05/24/19 at 3:03 pm by telephone and verified that I am speaking with the correct person using two identifiers. Blake Madden. is currently located at work and no one is currently with him during visit. The provider, Evelina Dun, FNP is located in their office at time of visit.  I discussed the limitations, risks, security and privacy concerns of performing an evaluation and management service by telephone and the availability of in person appointments. I also discussed with the patient that there may be a patient responsible charge related to this service. The patient expressed understanding and agreed to proceed.   History and Present Illness:  PT presents to the office today for chronic follow up. He is followed by Pulmonologist  annually for asbestosis and pulmonary nodule. He gets a yearly CT scan.  Hypertension This is a chronic problem. The current episode started more than 1 year ago. The problem has been resolved since onset. The problem is controlled. Pertinent negatives include no malaise/fatigue, peripheral edema or shortness of breath. Risk factors for coronary artery disease include obesity, male gender and sedentary lifestyle. The current treatment provides moderate improvement.  Gastroesophageal Reflux He complains of belching, coughing, heartburn and wheezing. This is a chronic problem. The current episode started more than 1 year ago. The problem occurs occasionally. The problem has  been waxing and waning. The heartburn is of moderate intensity. Risk factors include obesity. He has tried a PPI for the symptoms. The treatment provided moderate relief.  Asthma He complains of cough and wheezing. There is no shortness of breath. This is a chronic problem. The current episode started more than 1 year ago. The problem occurs intermittently. The problem has been waxing and waning. Associated symptoms include heartburn. Pertinent negatives include no malaise/fatigue. His symptoms are aggravated by pollen. His symptoms are alleviated by rest and steroid inhaler. He reports moderate improvement on treatment. His past medical history is significant for asthma.  Benign Prostatic Hypertrophy This is a chronic problem. The problem has been resolved since onset.  Insomnia Primary symptoms: difficulty falling asleep, frequent awakening, no malaise/fatigue.  The onset quality is gradual. The problem occurs intermittently. The problem has been waxing and waning since onset. PMH includes: depression.  Hyperlipidemia This is a chronic problem. The current episode started more than 1 year ago. Pertinent negatives include no shortness of breath. Current antihyperlipidemic treatment includes statins. The current treatment provides moderate improvement of lipids.  Depression        This is a chronic problem.  The current episode started more than 1 year ago.   The onset quality is gradual.   The problem occurs intermittently.  Associated symptoms include insomnia.     Review of Systems  Constitutional: Negative for malaise/fatigue.  Respiratory: Positive for cough and wheezing. Negative for shortness of breath.   Gastrointestinal: Positive for heartburn.  Psychiatric/Behavioral: Positive for depression. The patient has insomnia.   All other systems reviewed and are negative.    Observations/Objective: No SOB or distress noted   Assessment and Plan:  Blake Madden. comes in today with  chief complaint of No chief complaint on file.   Diagnosis and orders addressed:  1. Essential hypertension  2. Asthma, chronic obstructive, with acute exacerbation ([place])  3. Gastroesophageal reflux disease with esophagitis without hemorrhage - omeprazole (PRILOSEC) 40 MG capsule; TAKE 1 CAPSULE BY MOUTH EVERY DAY  Dispense: 90 capsule; Refill: 3  4. Benign prostatic hyperplasia with lower urinary tract symptoms, symptom details unspecified  5. Asbestos exposure  6. Controlled substance agreement signed - zolpidem (AMBIEN) 10 MG tablet; Take 1 tablet (10 mg total) by mouth at bedtime as needed for sleep.  Dispense: 30 tablet; Refill: 5  7. Moderate episode of recurrent major depressive disorder (HCC) - QUEtiapine (SEROQUEL) 200 MG tablet; Take 1 tablet (200 mg total) by mouth at bedtime.  Dispense: 90 tablet; Refill: 3  8. Hyperlipidemia, unspecified hyperlipidemia type  9. Insomnia, unspecified type - zolpidem (AMBIEN) 10 MG tablet; Take 1 tablet (10 mg total) by mouth at bedtime as needed for sleep.  Dispense: 30 tablet; Refill: 5  Pt reviewed in [place] controlled database- No red flags, contract and drug screen up to date Health Maintenance reviewed Diet and exercise encouraged  Follow up plan: 6 months      I discussed the assessment and treatment plan with the patient. The patient was provided an opportunity to ask questions and all were answered. The patient agreed with the plan and demonstrated an understanding of the instructions.   The patient was advised to call back or seek an in-person evaluation if the symptoms worsen or if the condition fails to improve as anticipated.  The above assessment and management plan was discussed with the patient. The patient verbalized understanding of and has agreed to the management plan. Patient is aware to call the clinic if symptoms persist or worsen. Patient is aware when to return to the clinic for a follow-up visit. Patient  educated on when it is appropriate to go to the emergency department.   Time call ended:  3:17 pm  I provided 14 minutes of non-face-to-face time during this encounter.    Evelina Dun, FNP

## 2019-06-07 ENCOUNTER — Telehealth (INDEPENDENT_AMBULATORY_CARE_PROVIDER_SITE_OTHER): Payer: Medicare Other | Admitting: Family Medicine

## 2019-06-07 ENCOUNTER — Encounter: Payer: Self-pay | Admitting: Family Medicine

## 2019-06-07 DIAGNOSIS — J411 Mucopurulent chronic bronchitis: Secondary | ICD-10-CM

## 2019-06-07 DIAGNOSIS — J441 Chronic obstructive pulmonary disease with (acute) exacerbation: Secondary | ICD-10-CM

## 2019-06-07 DIAGNOSIS — J45901 Unspecified asthma with (acute) exacerbation: Secondary | ICD-10-CM | POA: Diagnosis not present

## 2019-06-07 MED ORDER — AZITHROMYCIN 250 MG PO TABS: ORAL_TABLET | ORAL | 0 refills | Status: DC

## 2019-06-07 MED ORDER — PREDNISONE 20 MG PO TABS: ORAL_TABLET | ORAL | 0 refills | Status: DC

## 2019-06-07 NOTE — Progress Notes (Signed)
Virtual Visit via telephone Note  I connected with Blake Kinds. on 06/07/19 at 1819 by telephone and verified that I am speaking with the correct person using two identifiers. Blake Kinds. is currently located at home and no other people are currently with her during visit. The provider, Fransisca Kaufmann Eilleen Davoli, MD is located in their office at time of visit.  Call ended at 1825  I discussed the limitations, risks, security and privacy concerns of performing an evaluation and management service by telephone and the availability of in person appointments. I also discussed with the patient that there may be a patient responsible charge related to this service. The patient expressed understanding and agreed to proceed.   History and Present Illness: Patient is calling in for bronchitis and congestion in his chest and difficulty breathing and he gets like this every 2-3 months. He is having nonstop cough and wheezing.  He has never smoked or had exposure. It clears up but comes back frequently.   1. Asthma, chronic obstructive, with acute exacerbation ([place])   2. Bronchitis, mucopurulent recurrent ([place])     Outpatient Encounter Medications as of 06/07/2019  Medication Sig  . albuterol (PROAIR HFA) 108 (90 BASE) MCG/ACT inhaler Inhale 2 puffs into the lungs 4 (four) times daily as needed. (Patient taking differently: Inhale 2 puffs into the lungs 4 (four) times daily as needed for wheezing or shortness of breath. )  . atorvastatin (LIPITOR) 20 MG tablet Take 1 tablet (20 mg total) by mouth daily.  Marland Kitchen azithromycin (ZITHROMAX) 250 MG tablet Take 2 the first day and then one each day after.  . budesonide (PULMICORT) 0.5 MG/2ML nebulizer solution Take 2 mLs (0.5 mg total) by nebulization 2 (two) times daily.  . budesonide-formoterol (SYMBICORT) 160-4.5 MCG/ACT inhaler Inhale 2 puffs into the lungs 2 (two) times daily.  . montelukast (SINGULAIR) 10 MG tablet Take 1 tablet (10 mg total) by mouth  at bedtime.  Marland Kitchen omeprazole (PRILOSEC) 40 MG capsule TAKE 1 CAPSULE BY MOUTH EVERY DAY  . predniSONE (DELTASONE) 20 MG tablet Take 3 tabs daily for 1 week, then 2 tabs daily for week 2, then 1 tab daily for week 3.  . QUEtiapine (SEROQUEL) 200 MG tablet Take 1 tablet (200 mg total) by mouth at bedtime.  Marland Kitchen zolpidem (AMBIEN) 10 MG tablet Take 1 tablet (10 mg total) by mouth at bedtime as needed for sleep.   No facility-administered encounter medications on file as of 06/07/2019.    Review of Systems  Constitutional: Negative for chills and fever.  HENT: Positive for congestion, postnasal drip and rhinorrhea. Negative for ear discharge, ear pain, sinus pressure, sneezing, sore throat and voice change.   Eyes: Negative for pain, discharge, redness and visual disturbance.  Respiratory: Positive for cough, chest tightness and wheezing. Negative for shortness of breath.   Cardiovascular: Negative for chest pain and leg swelling.  Musculoskeletal: Negative for gait problem.  Skin: Negative for rash.  All other systems reviewed and are negative.   Observations/Objective: Patient sounds comfortable, does have a cough.  Assessment and Plan: Problem List Items Addressed This Visit      Respiratory   Asthma, chronic obstructive, with acute exacerbation ([place]) - Primary   Relevant Medications   azithromycin (ZITHROMAX) 250 MG tablet   predniSONE (DELTASONE) 20 MG tablet   Other Relevant Orders   Ambulatory referral to Pulmonology    Other Visit Diagnoses    Bronchitis, mucopurulent recurrent ([place])  Relevant Medications   azithromycin (ZITHROMAX) 250 MG tablet   Other Relevant Orders   Ambulatory referral to Pulmonology       Follow up plan: Return if symptoms worsen or fail to improve.     I discussed the assessment and treatment plan with the patient. The patient was provided an opportunity to ask questions and all were answered. The patient agreed with the plan and demonstrated  an understanding of the instructions.   The patient was advised to call back or seek an in-person evaluation if the symptoms worsen or if the condition fails to improve as anticipated.  The above assessment and management plan was discussed with the patient. The patient verbalized understanding of and has agreed to the management plan. Patient is aware to call the clinic if symptoms persist or worsen. Patient is aware when to return to the clinic for a follow-up visit. Patient educated on when it is appropriate to go to the emergency department.    I provided 6 minutes of non-face-to-face time during this encounter.    Worthy Rancher, MD

## 2019-06-30 ENCOUNTER — Institutional Professional Consult (permissible substitution): Payer: Medicare Other | Admitting: Critical Care Medicine

## 2019-07-19 ENCOUNTER — Encounter: Payer: Self-pay | Admitting: Internal Medicine

## 2019-07-19 ENCOUNTER — Ambulatory Visit (INDEPENDENT_AMBULATORY_CARE_PROVIDER_SITE_OTHER): Payer: Medicare Other | Admitting: Internal Medicine

## 2019-07-19 ENCOUNTER — Other Ambulatory Visit: Payer: Self-pay

## 2019-07-19 DIAGNOSIS — J449 Chronic obstructive pulmonary disease, unspecified: Secondary | ICD-10-CM

## 2019-07-19 DIAGNOSIS — J4489 Other specified chronic obstructive pulmonary disease: Secondary | ICD-10-CM | POA: Insufficient documentation

## 2019-07-19 MED ORDER — BREZTRI AEROSPHERE 160-9-4.8 MCG/ACT IN AERO: 2.0000 | INHALATION_SPRAY | Freq: Two times a day (BID) | RESPIRATORY_TRACT | 0 refills | Status: DC

## 2019-07-19 MED ORDER — ALBUTEROL SULFATE (2.5 MG/3ML) 0.083% IN NEBU
2.5000 mg | INHALATION_SOLUTION | RESPIRATORY_TRACT | 12 refills | Status: DC | PRN
Start: 2019-07-19 — End: 2020-09-11

## 2019-07-19 NOTE — Assessment & Plan Note (Addendum)
Onset was around 2014/ never smoker  - PFT's  03/17/2019  FEV1 2.19 (72 % ) ratio 0.67  p 19 % improvement from saba p 19 prior to stdy with DLCO  20/9  (74%) corrects to 4.63 (101%)  for alv volume and FV curve mildly concave  - 07/19/2019  After extensive coaching inhaler device,  effectiveness =  75% from a baseline of 50% > try breztri x 4 week sample then regroup   -Allergy profile 07/19/2019 >  Eos 0. /  IgE  rec   DDX of  difficult airways management almost all start with A and  include Adherence, Ace Inhibitors, Acid Reflux, Active Sinus Disease, Alpha 1 Antitripsin deficiency, Anxiety masquerading as Airways dz,  ABPA,  Allergy(esp in young), Aspiration (esp in elderly), Adverse effects of meds,  Active smoking or vaping, A bunch of PE's (a small clot burden can't cause this syndrome unless there is already severe underlying pulm or vascular dz with poor reserve) plus two Bs  = Bronchiectasis and Beta blocker use..and one C= CHF   Adherence is always the initial "prime suspect" and is a multilayered concern that requires a "trust but verify" approach in every patient - starting with knowing how to use medications, especially inhalers, correctly, keeping up with refills and understanding the fundamental difference between maintenance and prns vs those medications only taken for a very short course and then stopped and not refilled.  - see hfa teaching - does not get maint vs prn concept - return with all meds in hand using a trust but verify approach to confirm accurate Medication  Reconciliation The principal here is that until we are certain that the  patients are doing what we've asked, it makes no sense to ask them to do more.   ? Allergy/ asthma > needs allergy profile and for now  high dose ICS Judithann Sauger)  plus singulair   ? Acid (or non-acid) GERD > always difficult to exclude as up to 75% of pts in some series report no assoc GI/ Heartburn symptoms> rec max (24h)  acid suppression and diet  restrictions/ reviewed and instructions given in writing.   ? ABPA  > needs IgE   ? A bunch of PE's > pattern of flare not typical of PE  ? Anxiety/depression  > usually at the bottom of this list of usual suspects but should be included as  note already on psychotropics and may interfere with adherence and also interpretation of response or lack thereof to symptom management which can be quite subjective.   ? Bronchiectasis > unlikely based on recent CT chest 03/27/19   ? CHF > unlikely based on pattern clinically though always hard to r/o    Strongly advised on following: Only use your albuterol as a rescue medication to be used if you can't catch your breath by resting or doing a relaxed purse lip breathing pattern.  - The less you use it, the better it will work when you need it. - Ok to use up to 2 puffs  every 4 hours if you must but call for immediate appointment if use goes up over your usual need - Don't leave home without it !!  (think of it like the spare tire for your car)      Neb albuterol is a backup to the hfa albuterol  If your breathing worsens or you need to use your rescue inhaler more than twice weekly or wake up more than twice a month  with any respiratory symptoms or require more than two rescue inhalers per year, we need to see you right away because this means we're not controlling the underlying problem (inflammation) adequately. Used rash on skin/ topical creams to compare ICS to treating a rash.  Rescue inhalers (albuterol) do not control inflammation and overuse can lead to unnecessary and costly consequences.  They can make you feel better temporarily but eventually they will quit working effectively much as sleep aids lead to more insomnia if used regularly.       Advised:  formulary restrictions will be an ongoing challenge for the forseable future and I would be happy to pick an alternative if the pt will first  provide me a list of them -  pt  will need to  return here for training for any new device that is required eg dpi vs hfa vs respimat.    In the meantime we can always provide samples so that the patient never runs out of any needed respiratory medications.           Each maintenance medication was reviewed in detail including emphasizing most importantly the difference between maintenance and prns and under what circumstances the prns are to be triggered using an action plan format where appropriate.  Total time for H and P, chart review, counseling, teaching device and generating customized AVS unique to this office visit / charting = 60 min

## 2019-07-19 NOTE — Patient Instructions (Addendum)
Omeprazole should be Take 30-60 min before first meal of the day   GERD (REFLUX)  is an extremely common cause of respiratory symptoms just like yours , many times with no obvious heartburn at all.    It can be treated with medication, but also with lifestyle changes including elevation of the head of your bed (ideally with 6 -8inch blocks under the headboard of your bed),  Smoking cessation, avoidance of late meals, excessive alcohol, and avoid fatty foods, chocolate, peppermint, colas, red wine, and acidic juices such as orange juice.  NO MINT OR MENTHOL PRODUCTS SO NO COUGH DROPS  USE SUGARLESS CANDY INSTEAD (Jolley ranchers or Stover's or Life Savers) or even ice chips will also do - the key is to swallow to prevent all throat clearing. NO OIL BASED VITAMINS - use powdered substitutes.  Avoid fish oil when coughing.    Plan A = Automatic = Always=    Breztri Take 2 puffs first thing in am and then another 2 puffs about 12 hours later.   Work on inhaler technique:  relax and gently blow all the way out then take a nice smooth deep breath back in, triggering the inhaler at same time you start breathing in.  Hold for up to 5 seconds if you can. Blow out thru nose. Rinse and gargle with water when done     Plan B = Backup (to supplement plan A, not to replace it) Only use your albuterol inhaler as a rescue medication to be used if you can't catch your breath by resting or doing a relaxed purse lip breathing pattern.  - The less you use it, the better it will work when you need it. - Ok to use the inhaler up to 2 puffs  every 4 hours if you must but call for appointment if use goes up over your usual need - Don't leave home without it !!  (think of it like the spare tire for your car)   Plan C = Crisis - use your nebulizer up to every 4 hours if you try plan B first and it doesnt'   Please schedule a follow up office visit in 4 weeks, sooner if needed  with all medications /inhalers/  solutions in hand so we can verify exactly what you are taking. This includes all medications from all doctors and over the counters  [place] office  - bring your drug formulary Add needs allergy profile if not done yet

## 2019-07-19 NOTE — Progress Notes (Signed)
[name]., male    DOB: [birth year]     MRN: [id number]   Brief patient profile:  60  yowm never smoker healthy worked in [place] for [employer] all his life with dx of Asbestosis by Dr Pearlie Oyster with sob around  2014 but dx with AB by Lenna Gilford who rec symbicor / spiriva/singulair / alb but pt could not afford anything but albuterol and since then having 4-5 episodes per year  Last episode 06/07/19 completely responded to zpak/ pred and referred to pulmonary clinic 07/19/2019 by Dr   Dettinger with 3 weeks worth of prednisone.     History of Present Illness  07/19/2019  Pulmonary/ 1st office eval/Daeton Kluth  Second shot moderna > 1 m prior to [employer] Complaint  Patient presents with  . Consult    Pt has been experiencing some congestion and SOB  Dyspnea:  Not limited by breathing from desired activities   Cough: none now/ flares with nasal congestion despite main on singulair  Sleep: ok hs  SABA use: none now but has hfa and neb Last pred around 06/29/19 "works great every time but takes 3 full weeks to do so"  No obvious day to day or daytime variability or assoc excess/ purulent sputum or mucus plugs or hemoptysis or cp or chest tightness, subjective wheeze or overt   hb symptoms.   Sleeping  without nocturnal  or early am exacerbation  of respiratory  c/o's or need for noct saba. Also denies any obvious fluctuation of symptoms with weather or environmental changes or other aggravating or alleviating factors except as outlined above   No unusual exposure hx or h/o childhood pna/ asthma or knowledge of premature birth.  Current Allergies, Complete Past Medical History, Past Surgical History, Family History, and Social History were reviewed in [employer] record.  ROS  The following are not active complaints unless bolded Hoarseness, sore throat, dysphagia, dental problems, itching, sneezing,  nasal congestion or discharge of excess mucus or purulent secretions,  ear ache,   fever, chills, sweats, unintended wt loss or wt gain, classically pleuritic or exertional cp,  orthopnea pnd or arm/hand swelling  or leg swelling, presyncope, palpitations, abdominal pain, anorexia, nausea, vomiting, diarrhea  or change in bowel habits or change in bladder habits, change in stools or change in urine, dysuria, hematuria,  rash, arthralgias, visual complaints, headache, numbness, weakness or ataxia or problems with walking or coordination,  change in mood or  memory.             Past Medical History:  Diagnosis Date  . Allergic rhinitis   . Allergy   . Asthma   . BPH (benign prostatic hypertrophy)   . Depression   . GERD (gastroesophageal reflux disease)   . HTN (hypertension)   . Hyperlipemia   . Migraines     Outpatient Medications Prior to Visit  Medication Sig Dispense Refill  . albuterol (PROAIR HFA) 108 (90 BASE) MCG/ACT inhaler Inhale 2 puffs into the lungs 4 (four) times daily as needed. (Patient taking differently: Inhale 2 puffs into the lungs 4 (four) times daily as needed for wheezing or shortness of breath. ) 3.7 g 11  . atorvastatin (LIPITOR) 20 MG tablet Take 1 tablet (20 mg total) by mouth daily. 90 tablet 3  . azithromycin (ZITHROMAX) 250 MG tablet Take 2 the first day and then one each day after. 6 tablet 0  . budesonide-formoterol (SYMBICORT) 160-4.5 MCG/ACT inhaler Not using     .  montelukast (SINGULAIR) 10 MG tablet Take 1 tablet (10 mg total) by mouth at bedtime. 90 tablet 3  . omeprazole (PRILOSEC) 40 MG capsule TAKE 1 CAPSULE BY MOUTH EVERY DAY 90 capsule 3  .    42 tablet 0  . QUEtiapine (SEROQUEL) 200 MG tablet Take 1 tablet (200 mg total) by mouth at bedtime. 90 tablet 3  . budesonide (PULMICORT) 0.5 MG/2ML nebulizer solution    5  . zolpidem (AMBIEN) 10 MG tablet Take 1 tablet (10 mg total) by mouth at bedtime as needed for sleep. 30 tablet 5   No facility-administered medications prior to visit.     Objective:     BP  118/70 (BP Location: Left Arm, Cuff Size: Normal)   Pulse 71   Temp 98.4 F (36.9 C) (Oral)   Ht 5\' 7"  (1.702 m)   Wt 157 lb 12.8 oz (71.6 kg)   SpO2 97% Comment: RA  BMI 24.71 kg/m   SpO2: 97 % (RA)   Pleasant amb wm nad   HEENT : pt wearing mask not removed for exam due to covid -19 concerns.    NECK :  without JVD/Nodes/TM/ nl carotid upstrokes bilaterally   LUNGS: no acc muscle use,  Nl contour chest which is clear to A and P bilaterally without cough on insp or exp maneuvers   CV:  RRR  no s3 or murmur or increase in P2, and no edema   ABD:  soft and nontender with nl inspiratory excursion in the supine position. No bruits or organomegaly appreciated, bowel sounds nl  MS:  Nl gait/ ext warm without deformities, calf tenderness, cyanosis or clubbing No obvious joint restrictions   SKIN: warm and dry without lesions    NEURO:  alert, approp, nl sensorium with  no motor or cerebellar deficits apparent.    I personally reviewed images and agree with radiology impression as follows:   Chest CT  s contrast 03/27/19  Bronchial thickening with mucus plugs     Assessment   Asthma-COPD overlap syndrome (HCC) Onset was around 2014/ never smoker  - PFT's  03/17/2019  FEV1 2.19 (72 % ) ratio 0.67  p 19 % improvement from saba p 19 prior to stdy with DLCO  20/9  (74%) corrects to 4.63 (101%)  for alv volume and FV curve mildly concave  - 07/19/2019  After extensive coaching inhaler device,  effectiveness =  75% from a baseline of 50% > try breztri x 4 week sample then regroup   -Allergy profile 07/19/2019 >  Eos 0. /  IgE  rec   DDX of  difficult airways management almost all start with A and  include Adherence, Ace Inhibitors, Acid Reflux, Active Sinus Disease, Alpha 1 Antitripsin deficiency, Anxiety masquerading as Airways dz,  ABPA,  Allergy(esp in young), Aspiration (esp in elderly), Adverse effects of meds,  Active smoking or vaping, A bunch of PE's (a small clot burden can't  cause this syndrome unless there is already severe underlying pulm or vascular dz with poor reserve) plus two Bs  = Bronchiectasis and Beta blocker use..and one C= CHF   Adherence is always the initial "prime suspect" and is a multilayered concern that requires a "trust but verify" approach in every patient - starting with knowing how to use medications, especially inhalers, correctly, keeping up with refills and understanding the fundamental difference between maintenance and prns vs those medications only taken for a very short course and then stopped and not refilled.  -  see hfa teaching - does not get maint vs prn concept - return with all meds in hand using a trust but verify approach to confirm accurate Medication  Reconciliation The principal here is that until we are certain that the  patients are doing what we've asked, it makes no sense to ask them to do more.   ? Allergy/ asthma > high dose ICS (Breztri)  plus singulair   ? Acid (or non-acid) GERD > always difficult to exclude as up to 75% of pts in some series report no assoc GI/ Heartburn symptoms> rec max (24h)  acid suppression and diet restrictions/ reviewed and instructions given in writing.   ? ABPA  > needs IgE   ? A bunch of PE's > pattern of flare not typical of PE  ? Anxiety/depression  > usually at the bottom of this list of usual suspects but should be included as  note already on psychotropics and may interfere with adherence and also interpretation of response or lack thereof to symptom management which can be quite subjective.   ? Bronchiectasis > unlikely based on recent CT chest 03/27/19   ? CHF > unlikely based on pattern clinically though always hard to r/o    Strongly advised on following: Only use your albuterol as a rescue medication to be used if you can't catch your breath by resting or doing a relaxed purse lip breathing pattern.  - The less you use it, the better it will work when you need it. - Ok to use  up to 2 puffs  every 4 hours if you must but call for immediate appointment if use goes up over your usual need - Don't leave home without it !!  (think of it like the spare tire for your car)      Neb albuterol is a backup to the hfa albuterol  If your breathing worsens or you need to use your rescue inhaler more than twice weekly or wake up more than twice a month with any respiratory symptoms or require more than two rescue inhalers per year, we need to see you right away because this means we're not controlling the underlying problem (inflammation) adequately. Used rash on skin/ topical creams to compare ICS to treating a rash.  Rescue inhalers (albuterol) do not control inflammation and overuse can lead to unnecessary and costly consequences.  They can make you feel better temporarily but eventually they will quit working effectively much as sleep aids lead to more insomnia if used regularly.       Advised:  formulary restrictions will be an ongoing challenge for the forseable future and I would be happy to pick an alternative if the pt will first  provide me a list of them -  pt  will need to return here for training for any new device that is required eg dpi vs hfa vs respimat.    In the meantime we can always provide samples so that the patient never runs out of any needed respiratory medications.           Each maintenance medication was reviewed in detail including emphasizing most importantly the difference between maintenance and prns and under what circumstances the prns are to be triggered using an action plan format where appropriate.  Total time for H and P, chart review, counseling, teaching device and generating customized AVS unique to this office visit / charting = 60 min  Christinia Gully, MD 07/19/2019

## 2019-07-23 ENCOUNTER — Institutional Professional Consult (permissible substitution): Payer: Medicare Other | Admitting: Internal Medicine

## 2019-08-20 ENCOUNTER — Other Ambulatory Visit: Payer: Self-pay | Admitting: Family

## 2019-08-25 ENCOUNTER — Encounter: Payer: Self-pay | Admitting: Internal Medicine

## 2019-08-25 ENCOUNTER — Other Ambulatory Visit: Payer: Self-pay

## 2019-08-25 ENCOUNTER — Ambulatory Visit (INDEPENDENT_AMBULATORY_CARE_PROVIDER_SITE_OTHER): Payer: Medicare Other | Admitting: Internal Medicine

## 2019-08-25 DIAGNOSIS — J449 Chronic obstructive pulmonary disease, unspecified: Secondary | ICD-10-CM

## 2019-08-25 MED ORDER — BREZTRI AEROSPHERE 160-9-4.8 MCG/ACT IN AERO
2.0000 | INHALATION_SPRAY | Freq: Two times a day (BID) | RESPIRATORY_TRACT | 0 refills | Status: DC
Start: 2019-08-25 — End: 2019-08-26

## 2019-08-25 MED ORDER — PREDNISONE 10 MG PO TABS
ORAL_TABLET | ORAL | 2 refills | Status: DC
Start: 2019-08-25 — End: 2019-11-26

## 2019-08-25 NOTE — Patient Instructions (Addendum)
Please remember to go to the lab department at [facility]    for your tests - we will call you with the results when they are available.      Work on inhaler technique:  relax and gently blow all the way out then take a nice smooth deep breath back in, triggering the inhaler at same time you start breathing in.  Hold for up to 5 seconds if you can. Blow out thru nose. Rinse and gargle with water when done   If getting worse on ABC >  Plan D =deltasone (prednisone) Prednisone 10 mg take  4 each am x 2 days,   2 each am x 2 days,  1 each am x 2 days and stop    Please schedule a follow up visit in 3 months but call sooner if needed

## 2019-08-25 NOTE — Progress Notes (Signed)
Blake Madden., male    DOB: 1945-11-05     MRN: [id number]   Brief patient profile:  26  yowm never smoker healthy worked in [place] for [employer] all his life with dx of Asbestosis by Dr Pearlie Oyster with sob around  2014 but dx with AB by Lenna Gilford who rec symbicort / spiriva/singulair / alb but pt could not afford anything but albuterol and since then having 4-5 episodes per year  Last episode 06/07/19 completely responded to zpak/ pred and referred to pulmonary clinic 07/19/2019 by Dr   Dettinger with 3 weeks worth of prednisone.    History of Present Illness  07/19/2019  Pulmonary/ 1st office eval/Jorrell Kuster  Second shot moderna > 1 m prior to [employer] Complaint  Patient presents with  . Consult    Pt has been experiencing some congestion and SOB  Dyspnea:  Not limited by breathing from desired activities   Cough: none now/ flares with nasal congestion despite main on singulair  Sleep: ok hs  SABA use: none now but has hfa and neb Last pred around 06/29/19 "works great every time but takes 3 full weeks to do so" rec Omeprazole should be Take 30-60 min before first meal of the day  GERD .  Plan A = Automatic = Always=   Breztri Take 2 puffs first thing in am and then another 2 puffs about 12 hours later.  Work on inhaler technique:   Plan B = Backup (to supplement plan A, not to replace it) Only use your albuterol inhaler as a rescue medication  Plan C = Crisis - use your nebulizer up to every 4 hours if you try plan B first and it doesn't Please schedule a follow up office visit in 4 weeks, sooner if needed  with all medications /inhalers/ solutions in hand so we can verify exactly what you are taking. This includes all medications from all doctors and over the counters  [place] office  - bring your drug formulary    08/25/2019  f/u ov/Desteni Piscopo re: ACOS maint on  breztri Laurine Blazer  - did not bring formulary  Chief Complaint  Patient presents with  . Follow-up    No complaints    Dyspnea:   Not limited by breathing from desired activities   Cough: none  Sleeping: flat no problem flat / one pillow SABA use: none  02: none    No obvious day to day or daytime variability or assoc excess/ purulent sputum or mucus plugs or hemoptysis or cp or chest tightness, subjective wheeze or overt sinus or hb symptoms.   Sleeping as above  without nocturnal  or early am exacerbation  of respiratory  c/o's or need for noct saba. Also denies any obvious fluctuation of symptoms with weather or environmental changes or other aggravating or alleviating factors except as outlined above   No unusual exposure hx or h/o childhood pna/ asthma or knowledge of premature birth.  Current Allergies, Complete Past Medical History, Past Surgical History, Family History, and Social History were reviewed in [employer] record.  ROS  The following are not active complaints unless bolded Hoarseness, sore throat, dysphagia, dental problems, itching, sneezing,  nasal congestion or discharge of excess mucus or purulent secretions, ear ache,   fever, chills, sweats, unintended wt loss or wt gain, classically pleuritic or exertional cp,  orthopnea pnd or arm/hand swelling  or leg swelling, presyncope, palpitations, abdominal pain, anorexia, nausea, vomiting, diarrhea  or  change in bowel habits or change in bladder habits, change in stools or change in urine, dysuria, hematuria,  rash, arthralgias, visual complaints, headache, numbness, weakness or ataxia or problems with walking or coordination,  change in mood or  memory.        Current Meds  Medication Sig  . albuterol (PROAIR HFA) 108 (90 BASE) MCG/ACT inhaler Inhale 2 puffs into the lungs 4 (four) times daily as needed. (Patient taking differently: Inhale 2 puffs into the lungs 4 (four) times daily as needed for wheezing or shortness of breath. )  . albuterol (PROVENTIL) (2.5 MG/3ML) 0.083% nebulizer solution Take 3 mLs (2.5 mg total)  by nebulization every 4 (four) hours as needed for wheezing or shortness of breath.  Marland Kitchen atorvastatin (LIPITOR) 20 MG tablet TAKE 1 TABLET BY MOUTH EVERY DAY  . Budeson-Glycopyrrol-Formoterol (BREZTRI AEROSPHERE) 160-9-4.8 MCG/ACT AERO Inhale 2 puffs into the lungs 2 (two) times daily.  . montelukast (SINGULAIR) 10 MG tablet Take 1 tablet (10 mg total) by mouth at bedtime.  Marland Kitchen omeprazole (PRILOSEC) 40 MG capsule TAKE 1 CAPSULE BY MOUTH EVERY DAY  . QUEtiapine (SEROQUEL) 200 MG tablet Take 1 tablet (200 mg total) by mouth at bedtime.                  Past Medical History:  Diagnosis Date  . Allergic rhinitis   . Allergy   . Asthma   . BPH (benign prostatic hypertrophy)   . Depression   . GERD (gastroesophageal reflux disease)   . HTN (hypertension)   . Hyperlipemia   . Migraines         Objective:      Wt Readings from Last 3 Encounters:  08/25/19 151 lb 9.6 oz (68.8 kg)  07/19/19 157 lb 12.8 oz (71.6 kg)  11/03/18 158 lb 12.8 oz (72 kg)    Pleasant wm nad   Vital signs reviewed - Note on arrival 08/25/2019  02 sats  96% on RA   HEENT : pt wearing mask not removed for exam due to covid -19 concerns.    NECK :  without JVD/Nodes/TM/ nl carotid upstrokes bilaterally   LUNGS: no acc muscle use,  Nl contour chest which is clear to A and P bilaterally without cough on insp or exp maneuvers   CV:  RRR  no s3 or murmur or increase in P2, and no edema   ABD:  soft and nontender with nl inspiratory excursion in the supine position. No bruits or organomegaly appreciated, bowel sounds nl  MS:  Nl gait/ ext warm without deformities, calf tenderness, cyanosis or clubbing No obvious joint restrictions   SKIN: warm and dry without lesions    NEURO:  alert, approp, nl sensorium with  no motor or cerebellar deficits apparent.         Labs ordered 08/25/2019  :  allergy profile   alpha one AT phenotype     Did not go to lab    Assessment

## 2019-08-26 ENCOUNTER — Encounter: Payer: Self-pay | Admitting: Internal Medicine

## 2019-08-26 NOTE — Assessment & Plan Note (Addendum)
Onset was around 2014/ never smoker  - PFT's  03/17/2019  FEV1 2.19 (72 % ) ratio 0.67  p 19 % improvement from saba p 19 prior to stdy with DLCO  20/9  (74%) corrects to 4.63 (101%)  for alv volume and FV curve mildly concave  - 07/19/2019    try breztri x 4 week sample then regroup   -Allergy profile 07/19/2019 >  Eos 0. /  IgE  rec  -Alpha one Screen 08/25/2019  rec - 08/25/2019 added pred and plan D    All goals of chronic asthma control met including optimal function and elimination of symptoms with minimal need for rescue therapy on Breztri 2bid and singulair maint   Contingencies discussed in full including contacting this office immediately if not controlling the symptoms using the rule of two's.    >>> plan D will be added today to the ABCD action plan = pred x 6 d for flares   Advised: Advised:  formulary restrictions will be an ongoing challenge for the forseable future and I would be happy to pick an alternative if the pt will first  provide me a list of them -  pt  will need to return here for training for any new device that is required eg dpi vs hfa vs respimat.    In the meantime we can always provide samples so that the patient never runs out of any needed respiratory medications.   Needs to return for labs as above or if insurance won't cover breztri or symbicort/dulera as they are all the same basic device  - The proper method of use, as well as anticipated side effects, of a metered-dose inhaler were discussed and demonstrated to the patient.           Each maintenance medication was reviewed in detail including emphasizing most importantly the difference between maintenance and prns and under what circumstances the prns are to be triggered using an action plan format where appropriate.  Total time for H and P, chart review, counseling, teaching device and generating customized AVS unique to this office visit / charting = 30 min

## 2019-08-27 ENCOUNTER — Telehealth: Payer: Self-pay | Admitting: *Deleted

## 2019-08-27 NOTE — Telephone Encounter (Signed)
Spoke with the pt  I advised him of recommendations per Dr Melvyn Novas  He verbalized understanding  He is aware to go to lab  He had to go out of town and will go beginning of next wk  Will forward to Dr Melvyn Novas as Juluis Rainier

## 2019-08-27 NOTE — Telephone Encounter (Signed)
-----   Message from Tanda Rockers, MD sent at 08/26/2019  8:58 AM EDT ----- Ask him to go to lab to complete his w/u - it's very important he do so

## 2019-11-08 ENCOUNTER — Telehealth: Payer: Self-pay | Admitting: Internal Medicine

## 2019-11-08 MED ORDER — BREZTRI AEROSPHERE 160-9-4.8 MCG/ACT IN AERO: 2.0000 | INHALATION_SPRAY | Freq: Two times a day (BID) | RESPIRATORY_TRACT | 0 refills | Status: DC

## 2019-11-08 NOTE — Telephone Encounter (Signed)
Called spoke with patient.  He states he has had sample's and needs an RX patient has follow up on 11/16/19. 1 inhaler Rx'd for him to see pricing at his pharmacy. Patient will address with Dr. Melvyn Novas and follow up.  Nothing further needed at this time.

## 2019-11-15 ENCOUNTER — Other Ambulatory Visit: Payer: Self-pay | Admitting: Family

## 2019-11-15 DIAGNOSIS — J45901 Unspecified asthma with (acute) exacerbation: Secondary | ICD-10-CM

## 2019-11-15 DIAGNOSIS — J441 Chronic obstructive pulmonary disease with (acute) exacerbation: Secondary | ICD-10-CM

## 2019-11-16 ENCOUNTER — Ambulatory Visit (INDEPENDENT_AMBULATORY_CARE_PROVIDER_SITE_OTHER): Payer: Medicare Other | Admitting: *Deleted

## 2019-11-16 ENCOUNTER — Ambulatory Visit: Payer: Medicare Other | Admitting: Internal Medicine

## 2019-11-16 DIAGNOSIS — Z0000 Encounter for general adult medical examination without abnormal findings: Secondary | ICD-10-CM

## 2019-11-16 NOTE — Progress Notes (Signed)
MEDICARE ANNUAL WELLNESS VISIT  11/16/2019  Telephone Visit Disclaimer This Medicare AWV was conducted by telephone due to national recommendations for restrictions regarding the COVID-19 Pandemic (e.g. social distancing).  I verified, using two identifiers, that I am speaking with Blake Kinds. or their authorized healthcare agent. I discussed the limitations, risks, security, and privacy concerns of performing an evaluation and management service by telephone and the potential availability of an in-person appointment in the future. The patient expressed understanding and agreed to proceed.   Location of Patient: at work, in Health visitor (nurse):  [facility]  Subjective:    Blake Kinds. is a 74 y.o. male patient of Hawks, Theador Hawthorne, FNP who had a Medicare Annual Wellness Visit today via telephone. Blake Madden is working full time and lives alone. He has 3 children. He reports that he is socially active and does interact with friends/family regularly. He is moderately physically active and enjoys fishing.  Patient Care Team: Sharion Balloon, FNP as PCP - General (Family Medicine)  Advanced Directives 11/16/2019 11/10/2018 10/01/2017 07/01/2017 11/17/2016 02/01/2016 04/25/2015  Does Patient Have a Medical Advance Directive? No No No No No No No  Would patient like information on creating a medical advance directive? No - Patient declined No - Patient declined No - Patient declined - - Yes (MAU/Ambulatory/Procedural Areas - Information given) -    Hospital Utilization Over the Past 12 Months: # of hospitalizations or ER visits: 0 # of surgeries: 0  Review of Systems    Patient reports that his overall health is unchanged compared to last year.  History obtained from the patient.  Patient Reported Readings (BP, Pulse, CBG, Weight, etc) none  Pain Assessment Pain : No/denies pain     Current Medications & Allergies (verified) Allergies as of 11/16/2019       Reactions   No Known Allergies       Medication List       Accurate as of November 16, 2019  1:52 PM. If you have any questions, ask your nurse or doctor.        albuterol 108 (90 Base) MCG/ACT inhaler Commonly known as: ProAir HFA Inhale 2 puffs into the lungs 4 (four) times daily as needed. What changed: reasons to take this   albuterol (2.5 MG/3ML) 0.083% nebulizer solution Commonly known as: PROVENTIL Take 3 mLs (2.5 mg total) by nebulization every 4 (four) hours as needed for wheezing or shortness of breath. What changed: Another medication with the same name was changed. Make sure you understand how and when to take each.   atorvastatin 20 MG tablet Commonly known as: LIPITOR TAKE 1 TABLET BY MOUTH EVERY DAY   Breztri Aerosphere 160-9-4.8 MCG/ACT Aero Generic drug: Budeson-Glycopyrrol-Formoterol Inhale 2 puffs into the lungs 2 (two) times daily.   montelukast 10 MG tablet Commonly known as: SINGULAIR TAKE 1 TABLET BY MOUTH EVERYDAY AT BEDTIME   omeprazole 40 MG capsule Commonly known as: PRILOSEC TAKE 1 CAPSULE BY MOUTH EVERY DAY   predniSONE 10 MG tablet Commonly known as: DELTASONE Take  4 each am x 2 days,   2 each am x 2 days,  1 each am x 2 days and stop   QUEtiapine 200 MG tablet Commonly known as: SEROQUEL Take 1 tablet (200 mg total) by mouth at bedtime.   zolpidem 10 MG tablet Commonly known as: AMBIEN Take 1 tablet (10 mg total) by mouth at bedtime as needed for sleep.  History (reviewed): Past Medical History:  Diagnosis Date  . Allergic rhinitis   . Allergy   . Asthma   . BPH (benign prostatic hypertrophy)   . Depression   . GERD (gastroesophageal reflux disease)   . HTN (hypertension)   . Hyperlipemia   . Migraines    Past Surgical History:  Procedure Laterality Date  . INGUINAL HERNIA REPAIR Bilateral 02/07/2016   Procedure: LAPAROSCOPIC BILATERAL INGUINAL HERNIA REPAIR WITH UMBILICIAL HERNIA REPAIR;  Surgeon: Coralie Keens, MD;  Location: [facility];  Service: General;  Laterality: Bilateral;  . INSERTION OF MESH Bilateral 02/07/2016   Procedure: INSERTION OF MESH TO INGUINAL HERNIAS;  Surgeon: Coralie Keens, MD;  Location: [place];  Service: General;  Laterality: Bilateral;  . KNEE ARTHROSCOPY Left    Family History  Problem Relation Age of Onset  . Lung cancer Father        lung  . Bladder Cancer Father   . Colon cancer Sister   . Colon cancer Brother    Social History   Socioeconomic History  . Marital status: Divorced    Spouse name: Not on file  . Number of children: 3  . Years of education: 49  . Highest education level: 12th grade  Occupational History  . Occupation: full time    Employer: [employer]  Tobacco Use  . Smoking status: Never Smoker  . Smokeless tobacco: Never Used  Vaping Use  . Vaping Use: Never used  Substance and Sexual Activity  . Alcohol use: No    Alcohol/week: 2.0 standard drinks    Types: 2 Cans of beer per week    Comment: rare  . Drug use: No  . Sexual activity: Not on file  Other Topics Concern  . Not on file  Social History Narrative  . Not on file   Social Determinants of Health   Financial Resource Strain:   . Difficulty of Paying Living Expenses: Not on file  Food Insecurity:   . Worried About Charity fundraiser in the Last Year: Not on file  . Ran Out of Food in the Last Year: Not on file  Transportation Needs:   . Lack of Transportation (Medical): Not on file  . Lack of Transportation (Non-Medical): Not on file  Physical Activity:   . Days of Exercise per Week: Not on file  . Minutes of Exercise per Session: Not on file  Stress:   . Feeling of Stress : Not on file  Social Connections:   . Frequency of Communication with Friends and Family: Not on file  . Frequency of Social Gatherings with Friends and Family: Not on file  . Attends Religious Services: Not on file  . Active Member of Clubs or Organizations: Not on file    . Attends Archivist Meetings: Not on file  . Marital Status: Not on file    Activities of Daily Living In your present state of health, do you have any difficulty performing the following activities: 11/16/2019  Hearing? N  Vision? N  Difficulty concentrating or making decisions? N  Walking or climbing stairs? N  Dressing or bathing? N  Doing errands, shopping? N  Preparing Food and eating ? N  Using the Toilet? N  In the past six months, have you accidently leaked urine? N  Do you have problems with loss of bowel control? N  Managing your Medications? N  Managing your Finances? N  Housekeeping or managing your Housekeeping? N  Some  recent data might be hidden    Patient Education/ Literacy How often do you need to have someone help you when you read instructions, pamphlets, or other written materials from your doctor or pharmacy?: 1 - Never What is the last grade level you completed in school?: 12th  Exercise Current Exercise Habits: The patient has a physically strenuous job, but has no regular exercise apart from work., Exercise limited by: None identified  Diet Patient reports consuming 3 meals a day and 2 snack(s) a day Patient reports that his primary diet is: Regular Patient reports that she does have regular access to food.   Depression Screen PHQ 2/9 Scores 11/16/2019 05/24/2019 11/10/2018 11/03/2018 11/03/2018 12/15/2017 10/01/2017  PHQ - 2 Score 0 0 0 0 0 0 0  PHQ- 9 Score - 0 - 0 - - -     Fall Risk Fall Risk  11/16/2019 11/10/2018 11/03/2018 12/15/2017 10/01/2017  Falls in the past year? 0 0 0 0 No  Number falls in past yr: - - - - -  Injury with Fall? - - - - -  Comment - - - - -     Objective:  Blake Kinds. seemed alert and oriented and he participated appropriately during our telephone visit.  Blood Pressure Weight BMI  BP Readings from Last 3 Encounters:  08/25/19 108/68  07/19/19 118/70  11/03/18 130/83   Wt Readings from Last 3  Encounters:  08/25/19 151 lb 9.6 oz (68.8 kg)  07/19/19 157 lb 12.8 oz (71.6 kg)  11/03/18 158 lb 12.8 oz (72 kg)   BMI Readings from Last 1 Encounters:  08/25/19 23.74 kg/m    *Unable to obtain current vital signs, weight, and BMI due to telephone visit type  Hearing/Vision  . Teal did not seem to have difficulty with hearing/understanding during the telephone conversation . Reports that he has had a formal eye exam by an eye care professional within the past year . Reports that he has not had a formal hearing evaluation within the past year *Unable to fully assess hearing and vision during telephone visit type  Cognitive Function: 6CIT Screen 11/16/2019 11/10/2018  What Year? 0 points 0 points  What month? 0 points 0 points  What time? 0 points 0 points  Count back from 20 0 points 0 points  Months in reverse 0 points 0 points  Repeat phrase 0 points 2 points  Total Score 0 2   (Normal:0-7, Significant for Dysfunction: >8)  Normal Cognitive Function Screening: Yes   Immunization & Health Maintenance Record Immunization History  Administered Date(s) Administered  . Fluad Quad(high Dose 65+) 11/03/2018  . Influenza, High Dose Seasonal PF 02/01/2017  . Influenza,inj,Quad PF,6+ Mos 03/09/2014, 12/19/2014  . Influenza-Unspecified 10/28/2017  . Moderna SARS-COVID-2 Vaccination 04/07/2019, 05/05/2019  . Pneumococcal Conjugate-13 03/09/2014  . Pneumococcal Polysaccharide-23 07/08/2016  . Tdap 11/03/2018    Health Maintenance  Topic Date Due  . INFLUENZA VACCINE  08/29/2019  . TETANUS/TDAP  11/02/2028  . COVID-19 Vaccine  Completed  . Hepatitis C Screening  Completed  . PNA vac Low Risk Adult  Completed       Assessment  This is a routine wellness examination for Blake MaddenMarland Kitchen  Health Maintenance: Due or Overdue Health Maintenance Due  Topic Date Due  . INFLUENZA VACCINE  08/29/2019    Blake Kinds. does not need a referral for Community  Assistance: Care Management:   no Social Work:    no Prescription  Assistance:  no Nutrition/Diabetes Education:  no   Plan:  Personalized Goals Goals Addressed            This Visit's Progress   . Patient Stated       11/16/2019 AWV Goal: Keep All Scheduled Appointments  Over the next year, patient will attend all scheduled appointments with their PCP and any specialists that they see.       Personalized Health Maintenance & Screening Recommendations  Influenza vaccine  Lung Cancer Screening Recommended: no (Low Dose CT Chest recommended if Age 85-80 years, 30 pack-year currently smoking OR have quit w/in past 15 years) Hepatitis C Screening recommended: no HIV Screening recommended: no  Advanced Directives: Written information was not prepared per patient's request.  Referrals & Orders No orders of the defined types were placed in this encounter.   Follow-up Plan . Follow-up with Sharion Balloon, FNP as planned . Schedule flu vaccine   .    I have personally reviewed and noted the following in the patient's chart:   . Medical and social history . Use of alcohol, tobacco or illicit drugs  . Current medications and supplements . Functional ability and status . Nutritional status . Physical activity . Advanced directives . List of other physicians . Hospitalizations, surgeries, and ER visits in previous 12 months . Vitals . Screenings to include cognitive, depression, and falls . Referrals and appointments  In addition, I have reviewed and discussed with Blake Kinds. certain preventive protocols, quality metrics, and best practice recommendations. A written personalized care plan for preventive services as well as general preventive health recommendations is available and can be mailed to the patient at his request.      Baldomero Lamy, LPN [id number]/59/2924

## 2019-11-18 ENCOUNTER — Other Ambulatory Visit: Payer: Self-pay | Admitting: Family

## 2019-11-18 DIAGNOSIS — G4700 Insomnia, unspecified: Secondary | ICD-10-CM

## 2019-11-18 DIAGNOSIS — Z79899 Other long term (current) drug therapy: Secondary | ICD-10-CM

## 2019-11-26 ENCOUNTER — Encounter: Payer: Self-pay | Admitting: Family

## 2019-11-26 ENCOUNTER — Ambulatory Visit (INDEPENDENT_AMBULATORY_CARE_PROVIDER_SITE_OTHER): Payer: Medicare Other | Admitting: Family

## 2019-11-26 ENCOUNTER — Other Ambulatory Visit: Payer: Self-pay

## 2019-11-26 VITALS — BP 91/59 | HR 73 | Temp 100.0°F | Ht 67.0 in | Wt 158.4 lb

## 2019-11-26 DIAGNOSIS — F331 Major depressive disorder, recurrent, moderate: Secondary | ICD-10-CM

## 2019-11-26 DIAGNOSIS — K2100 Gastro-esophageal reflux disease with esophagitis, without bleeding: Secondary | ICD-10-CM | POA: Diagnosis not present

## 2019-11-26 DIAGNOSIS — Z77090 Contact with and (suspected) exposure to asbestos: Secondary | ICD-10-CM

## 2019-11-26 DIAGNOSIS — J441 Chronic obstructive pulmonary disease with (acute) exacerbation: Secondary | ICD-10-CM

## 2019-11-26 DIAGNOSIS — N401 Enlarged prostate with lower urinary tract symptoms: Secondary | ICD-10-CM | POA: Diagnosis not present

## 2019-11-26 DIAGNOSIS — E785 Hyperlipidemia, unspecified: Secondary | ICD-10-CM

## 2019-11-26 DIAGNOSIS — J069 Acute upper respiratory infection, unspecified: Secondary | ICD-10-CM

## 2019-11-26 DIAGNOSIS — G4700 Insomnia, unspecified: Secondary | ICD-10-CM

## 2019-11-26 DIAGNOSIS — Z79899 Other long term (current) drug therapy: Secondary | ICD-10-CM

## 2019-11-26 DIAGNOSIS — J45901 Unspecified asthma with (acute) exacerbation: Secondary | ICD-10-CM

## 2019-11-26 DIAGNOSIS — I10 Essential (primary) hypertension: Secondary | ICD-10-CM | POA: Diagnosis not present

## 2019-11-26 LAB — LIPID PANEL

## 2019-11-26 MED ORDER — MONTELUKAST SODIUM 10 MG PO TABS: ORAL_TABLET | ORAL | 2 refills | Status: DC

## 2019-11-26 MED ORDER — ATORVASTATIN CALCIUM 20 MG PO TABS: 20.0000 mg | ORAL_TABLET | Freq: Every day | ORAL | 2 refills | Status: DC

## 2019-11-26 MED ORDER — ZOLPIDEM TARTRATE 10 MG PO TABS: 10.0000 mg | ORAL_TABLET | Freq: Every evening | ORAL | 5 refills | Status: DC | PRN

## 2019-11-26 MED ORDER — PREDNISONE 10 MG (21) PO TBPK: ORAL_TABLET | ORAL | 0 refills | Status: DC

## 2019-11-26 MED ORDER — QUETIAPINE FUMARATE 200 MG PO TABS: 200.0000 mg | ORAL_TABLET | Freq: Every day | ORAL | 3 refills | Status: DC

## 2019-11-26 NOTE — Patient Instructions (Signed)

## 2019-11-26 NOTE — Progress Notes (Signed)
Subjective:    Patient ID: Blake Madden., male    DOB: 02-Aug-1945, 74 y.o.   MRN: [id number]  Chief Complaint  Patient presents with  . Hypertension  . Nasal Congestion    cough, nasal congestion denies fever woke up this morning   PT presents to the office today for chronic follow up. He is followed by Pulmonologist  annually for asbestosis and pulmonary nodule. He gets a yearly CT scan.  Hypertension This is a chronic problem. The current episode started more than 1 year ago. The problem has been resolved since onset. Associated symptoms include shortness of breath. Pertinent negatives include no headaches, malaise/fatigue or peripheral edema. Risk factors for coronary artery disease include dyslipidemia and sedentary lifestyle. The current treatment provides moderate improvement.  Asthma He complains of cough, shortness of breath and wheezing. This is a chronic problem. The current episode started more than 1 year ago. The problem occurs intermittently. The problem has been waxing and waning. Associated symptoms include heartburn and rhinorrhea. Pertinent negatives include no headaches, malaise/fatigue or sore throat. He reports minimal improvement on treatment. His past medical history is significant for asthma.  Gastroesophageal Reflux He complains of belching, coughing, heartburn and wheezing. He reports no sore throat. This is a chronic problem. The current episode started more than 1 year ago. The problem occurs occasionally. The problem has been waxing and waning. He has tried a PPI for the symptoms. The treatment provided moderate relief.  Benign Prostatic Hypertrophy This is a chronic problem. The current episode started more than 1 year ago. The problem has been resolved since onset. Irritative symptoms do not include nocturia or urgency. Pertinent negatives include no dysuria.  Insomnia Primary symptoms: difficulty falling asleep, frequent awakening, no malaise/fatigue.  The  current episode started more than one year. The onset quality is gradual. The problem occurs intermittently. PMH includes: depression.  Hyperlipidemia This is a chronic problem. The current episode started more than 1 year ago. The problem is uncontrolled. Recent lipid tests were reviewed and are high. Associated symptoms include shortness of breath. Current antihyperlipidemic treatment includes statins. The current treatment provides moderate improvement of lipids. Risk factors for coronary artery disease include diabetes mellitus, male sex, hypertension, dyslipidemia and a sedentary lifestyle.  Depression        This is a chronic problem.  The current episode started more than 1 year ago.   The onset quality is gradual.   The problem occurs intermittently.  Associated symptoms include insomnia, irritable and restlessness.  Associated symptoms include no helplessness, no hopelessness and no headaches. URI  This is a new problem. The current episode started today. The problem has been gradually worsening. The maximum temperature recorded prior to his arrival was 100.4 - 100.9 F. Associated symptoms include congestion, coughing, rhinorrhea and wheezing. Pertinent negatives include no dysuria, headaches, sinus pain or sore throat. He has tried antihistamine for the symptoms. The treatment provided mild relief.      Review of Systems  Constitutional: Negative for malaise/fatigue.  HENT: Positive for congestion and rhinorrhea. Negative for sinus pain and sore throat.   Respiratory: Positive for cough, shortness of breath and wheezing.   Gastrointestinal: Positive for heartburn.  Genitourinary: Negative for dysuria, nocturia and urgency.  Neurological: Negative for headaches.  Psychiatric/Behavioral: Positive for depression. The patient has insomnia.   All other systems reviewed and are negative.      Objective:   Physical Exam Vitals reviewed.  Constitutional:  General: He is irritable. He  is not in acute distress.    Appearance: He is well-developed.  HENT:     Head: Normocephalic.     Right Ear: Tympanic membrane normal.     Left Ear: Tympanic membrane normal.  Eyes:     General:        Right eye: No discharge.        Left eye: No discharge.     Pupils: Pupils are equal, round, and reactive to light.  Neck:     Thyroid: No thyromegaly.  Cardiovascular:     Rate and Rhythm: Normal rate and regular rhythm.     Heart sounds: Normal heart sounds. No murmur heard.   Pulmonary:     Effort: Pulmonary effort is normal. No respiratory distress.     Breath sounds: Decreased breath sounds present. No wheezing.  Abdominal:     General: Bowel sounds are normal. There is no distension.     Palpations: Abdomen is soft.     Tenderness: There is no abdominal tenderness.  Musculoskeletal:        General: No tenderness. Normal range of motion.     Cervical back: Normal range of motion and neck supple.  Skin:    General: Skin is warm and dry.     Findings: No erythema or rash.     Comments: Sweating   Neurological:     Mental Status: He is alert and oriented to person, place, and time.     Cranial Nerves: No cranial nerve deficit.     Deep Tendon Reflexes: Reflexes are normal and symmetric.  Psychiatric:        Behavior: Behavior normal.        Thought Content: Thought content normal.        Judgment: Judgment normal.       BP (!) 91/59   Pulse 73   Temp 100 F (37.8 C) (Temporal)   Ht 5' 7"  (1.702 m)   Wt 158 lb 6.4 oz (71.8 kg)   SpO2 92%   BMI 24.81 kg/m      Assessment & Plan:  Blake Madden. comes in today with chief complaint of Hypertension and Nasal Congestion (cough, nasal congestion denies fever woke up this morning)   Diagnosis and orders addressed:  1. Primary hypertension - CMP14+EGFR - CBC with Differential/Platelet  2. Asthma, chronic obstructive, with acute exacerbation (HCC) - CMP14+EGFR - CBC with Differential/Platelet -  montelukast (SINGULAIR) 10 MG tablet; TAKE 1 TABLET BY MOUTH EVERYDAY AT BEDTIME  Dispense: 90 tablet; Refill: 2  3. Gastroesophageal reflux disease with esophagitis without hemorrhage - CMP14+EGFR - CBC with Differential/Platelet  4. Benign prostatic hyperplasia with lower urinary tract symptoms, symptom details unspecified - CMP14+EGFR - CBC with Differential/Platelet  5. Asbestos exposure - CMP14+EGFR - CBC with Differential/Platelet  6. Controlled substance agreement signed - CMP14+EGFR - CBC with Differential/Platelet - ToxASSURE Select 13 (MW), Urine - zolpidem (AMBIEN) 10 MG tablet; Take 1 tablet (10 mg total) by mouth at bedtime as needed for sleep.  Dispense: 30 tablet; Refill: 5  7. Moderate episode of recurrent major depressive disorder (HCC) - CMP14+EGFR - CBC with Differential/Platelet - QUEtiapine (SEROQUEL) 200 MG tablet; Take 1 tablet (200 mg total) by mouth at bedtime.  Dispense: 90 tablet; Refill: 3  8. Hyperlipidemia, unspecified hyperlipidemia type - CMP14+EGFR - CBC with Differential/Platelet - Lipid panel - atorvastatin (LIPITOR) 20 MG tablet; Take 1 tablet (20 mg total) by mouth daily.  Dispense:  90 tablet; Refill: 2  9. Insomnia, unspecified type - CMP14+EGFR - CBC with Differential/Platelet - ToxASSURE Select 13 (MW), Urine - zolpidem (AMBIEN) 10 MG tablet; Take 1 tablet (10 mg total) by mouth at bedtime as needed for sleep.  Dispense: 30 tablet; Refill: 5  10. Viral upper respiratory tract infection - Take meds as prescribed - Use a cool mist humidifier  -Use saline nose sprays frequently -Force fluids -For any cough or congestion  Use plain Mucinex- regular strength or max strength is fine -For fever or aces or pains- take tylenol or ibuprofen. -Throat lozenges if help -RTO if symptoms worsen  - CMP14+EGFR - CBC with Differential/Platelet - Novel Coronavirus, NAA ([facility]) - predniSONE (STERAPRED UNI-PAK 21 TAB) 10 MG (21) TBPK tablet; Use  as directed  Dispense: 21 tablet; Refill: 0   Labs pending Health Maintenance reviewed Diet and exercise encouraged  Follow up plan: 6 months    Evelina Dun, FNP

## 2019-11-27 LAB — CBC WITH DIFFERENTIAL/PLATELET
Basophils Absolute: 0.1 10*3/uL (ref 0.0–0.2)
Basos: 1 %
EOS (ABSOLUTE): 0.6 10*3/uL — ABNORMAL HIGH (ref 0.0–0.4)
Eos: 7 %
Hematocrit: 41.4 % (ref 37.5–51.0)
Hemoglobin: 14.3 g/dL (ref 13.0–17.7)
Immature Grans (Abs): 0.1 10*3/uL (ref 0.0–0.1)
Immature Granulocytes: 1 %
Lymphocytes Absolute: 2 10*3/uL (ref 0.7–3.1)
Lymphs: 22 %
MCH: 31.5 pg (ref 26.6–33.0)
MCHC: 34.5 g/dL (ref 31.5–35.7)
MCV: 91 fL (ref 79–97)
Monocytes Absolute: 1.8 10*3/uL — ABNORMAL HIGH (ref 0.1–0.9)
Monocytes: 19 %
Neutrophils Absolute: 4.7 10*3/uL (ref 1.4–7.0)
Neutrophils: 50 %
Platelets: 245 10*3/uL (ref 150–450)
RBC: 4.54 x10E6/uL (ref 4.14–5.80)
RDW: 12.6 % (ref 11.6–15.4)
WBC: 9.3 10*3/uL (ref 3.4–10.8)

## 2019-11-27 LAB — CMP14+EGFR
ALT: 20 IU/L (ref 0–44)
AST: 14 IU/L (ref 0–40)
Albumin/Globulin Ratio: 2.1 (ref 1.2–2.2)
Albumin: 3.8 g/dL (ref 3.7–4.7)
Alkaline Phosphatase: 68 IU/L (ref 44–121)
BUN/Creatinine Ratio: 17 (ref 10–24)
BUN: 13 mg/dL (ref 8–27)
Bilirubin Total: 0.9 mg/dL (ref 0.0–1.2)
CO2: 24 mmol/L (ref 20–29)
Calcium: 9 mg/dL (ref 8.6–10.2)
Chloride: 103 mmol/L (ref 96–106)
Creatinine, Ser: 0.76 mg/dL (ref 0.76–1.27)
GFR calc Af Amer: 103 mL/min/{1.73_m2} (ref >59)
GFR calc non Af Amer: 89 mL/min/{1.73_m2} (ref >59)
Globulin, Total: 1.8 g/dL (ref 1.5–4.5)
Glucose: 105 mg/dL — ABNORMAL HIGH (ref 65–99)
Potassium: 3.8 mmol/L (ref 3.5–5.2)
Sodium: 140 mmol/L (ref 134–144)
Total Protein: 5.6 g/dL — ABNORMAL LOW (ref 6.0–8.5)

## 2019-11-27 LAB — LIPID PANEL
Chol/HDL Ratio: 2.8 ratio (ref 0.0–5.0)
Cholesterol, Total: 132 mg/dL (ref 100–199)
HDL: 47 mg/dL (ref >39)
LDL Chol Calc (NIH): 53 mg/dL (ref 0–99)
Triglycerides: 197 mg/dL — ABNORMAL HIGH (ref 0–149)
VLDL Cholesterol Cal: 32 mg/dL (ref 5–40)

## 2019-11-27 LAB — NOVEL CORONAVIRUS, NAA: SARS-CoV-2, NAA: DETECTED — AB

## 2019-11-27 LAB — SARS-COV-2, NAA 2 DAY TAT

## 2019-11-28 ENCOUNTER — Encounter: Payer: Self-pay | Admitting: Oncology

## 2019-11-28 ENCOUNTER — Telehealth (HOSPITAL_COMMUNITY): Payer: Self-pay | Admitting: Oncology

## 2019-11-28 NOTE — Telephone Encounter (Signed)
Called to Discuss with patient about Covid symptoms and the use of the monoclonal antibody infusion for those with mild to moderate Covid symptoms and at a high risk of hospitalization.     Pt is qualified for this infusion due to co-morbid conditions and/or a member of an at-risk group.     Patient Active Problem List   Diagnosis Date Noted  . Asthma-COPD overlap syndrome ([place]) 07/19/2019  . Controlled substance agreement signed 11/03/2018  . Insomnia 04/23/2018  . Benign prostatic hyperplasia   . HTN (hypertension)   . Hyperlipemia   . Asbestos exposure 05/06/2014  . Asthma, chronic obstructive, with acute exacerbation ([place]) 07/17/2012  . GERD (gastroesophageal reflux disease) 05/08/2012  . Depression 05/08/2012    Patient declines infusion at this time. Symptoms tier reviewed as well as criteria for ending isolation. Preventative practices reviewed. Patient verbalized understanding.    Patient advised to call back if he/she decides that he/she does want to get infusion. Callback number to the infusion center given. Patient advised to go to Urgent care or ED with severe symptoms.   Faythe Casa, NP 11/28/2019 9:25 AM

## 2019-12-02 LAB — TOXASSURE SELECT 13 (MW), URINE

## 2019-12-07 DIAGNOSIS — Z20822 Contact with and (suspected) exposure to covid-19: Secondary | ICD-10-CM | POA: Diagnosis not present

## 2020-02-22 IMAGING — DX DG SHOULDER 2+V*R*
3 series · 3 of 3 positions shown · non-contrast
Comparison: None.

CLINICAL DATA: Pain following fall

EXAM:
RIGHT SHOULDER - 2+ VIEW

[shoulder ap]
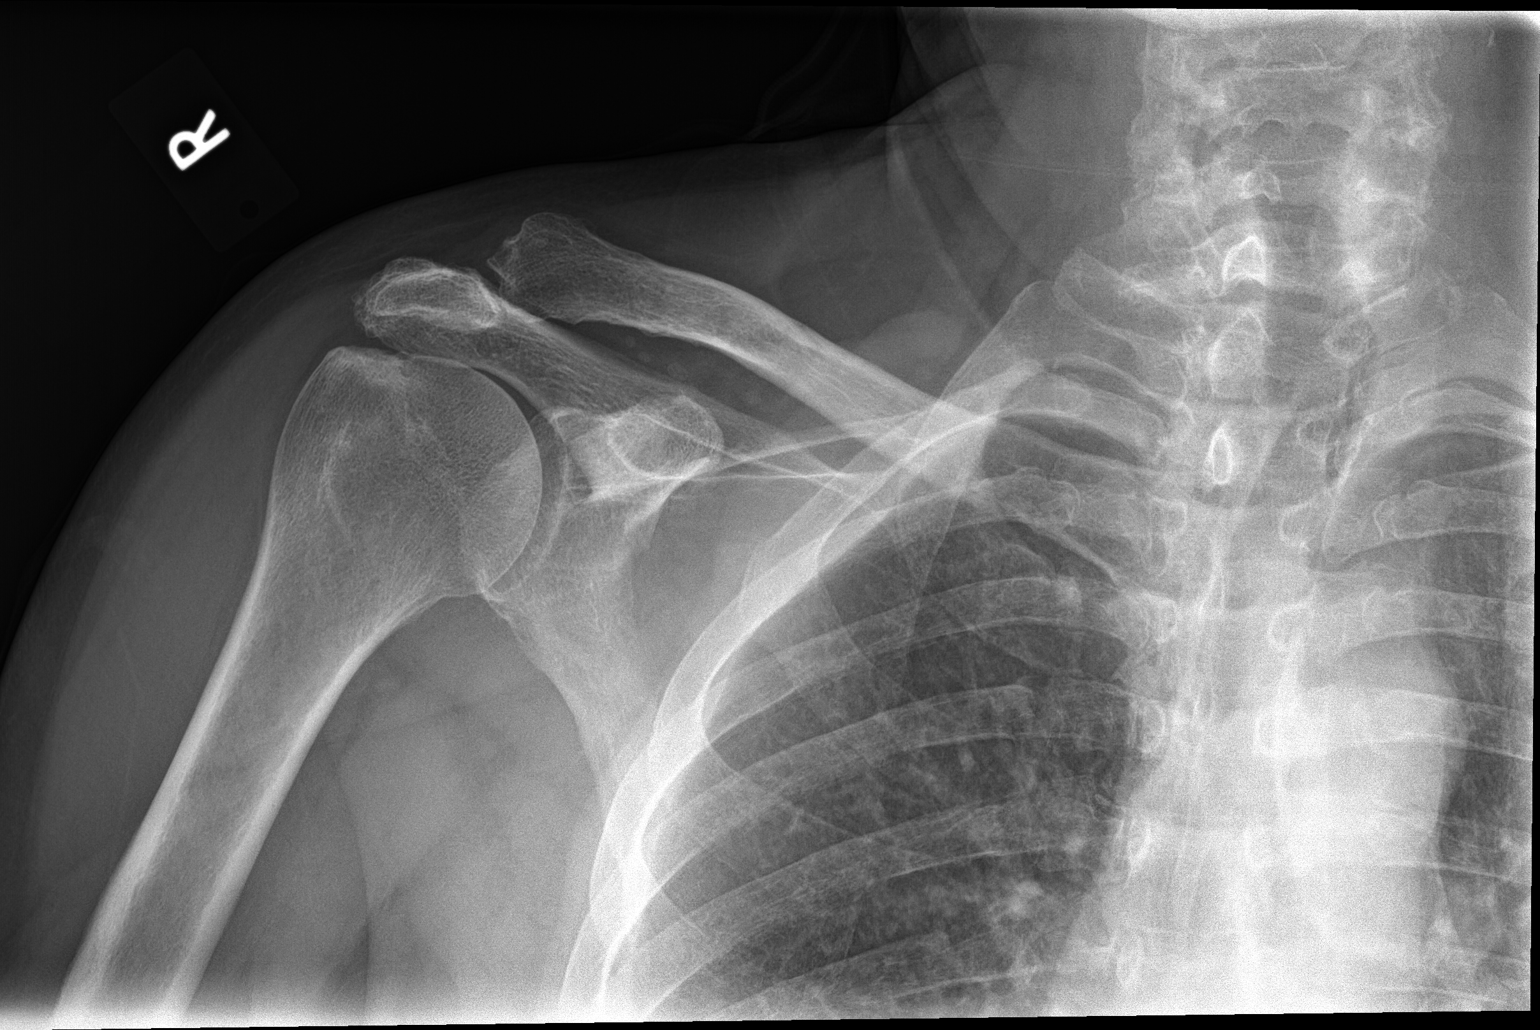

[shoulder obl]
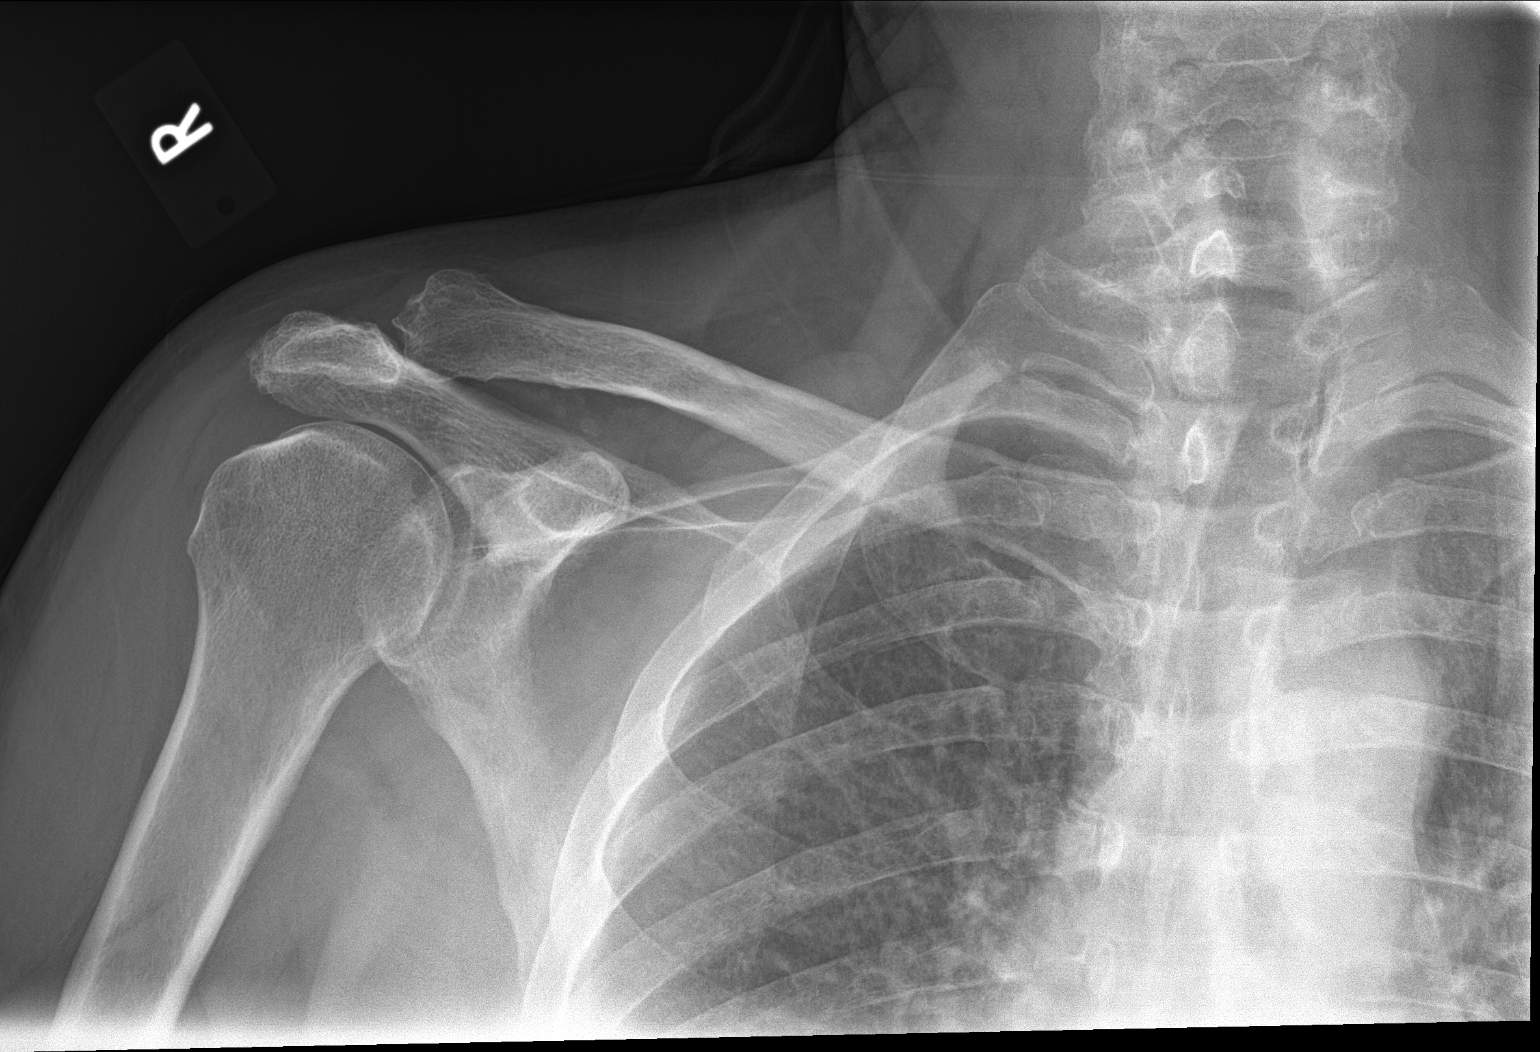

[shoulder axial]
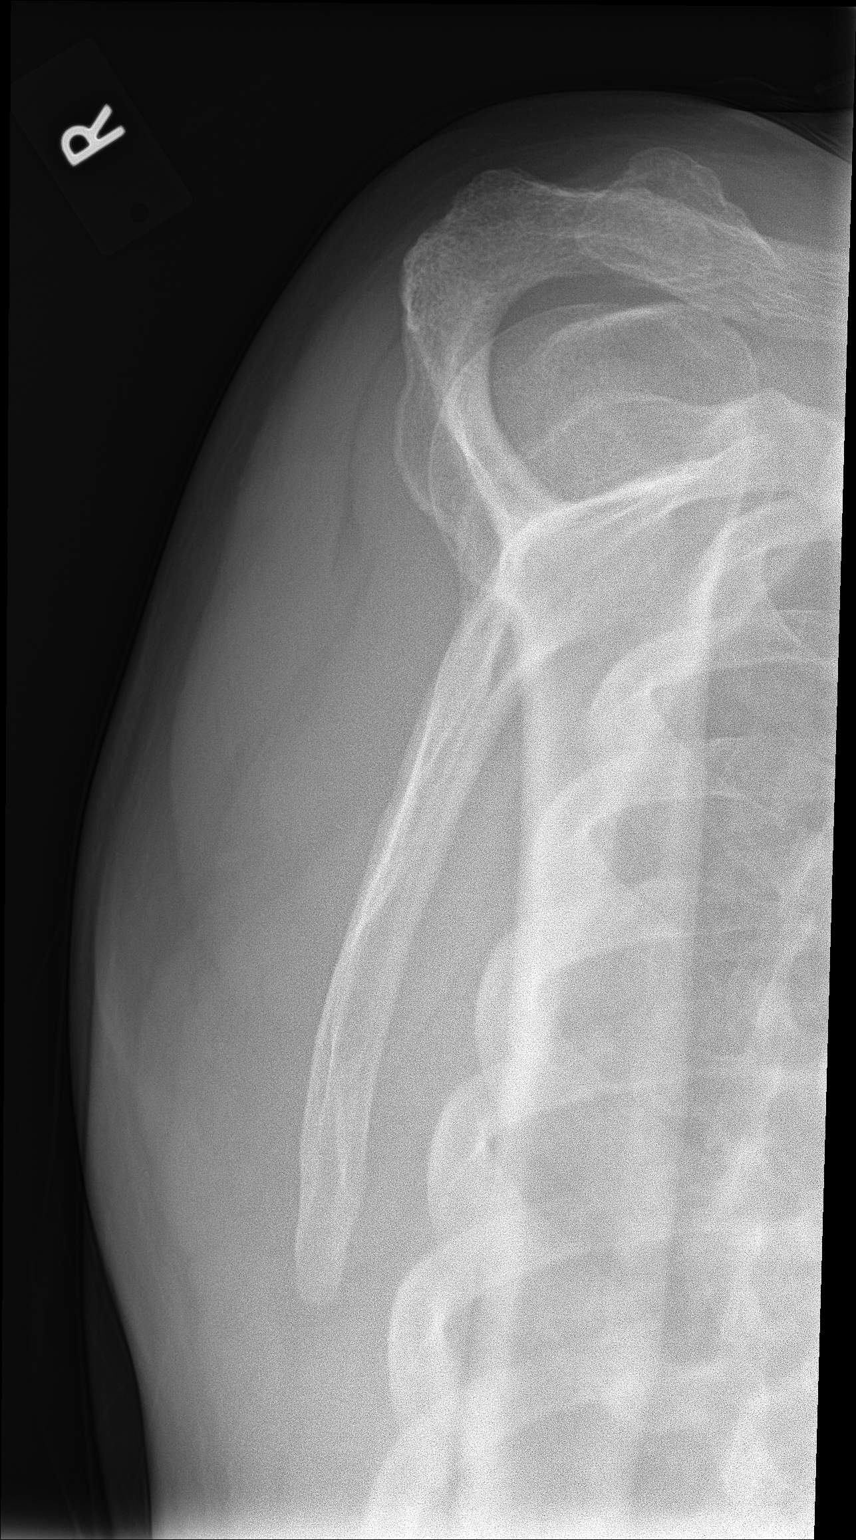

[3 of 3 positions shown; findings below may reference images not displayed]

FINDINGS: Internal rotation, external rotation, Y scapular images were
obtained. There is no fracture or dislocation. There is moderate
generalized osteoarthritic change. No erosive change or
intra-articular calcification. Visualized right lung is clear.
IMPRESSION: Moderate generalized osteoarthritic change. No fracture or
dislocation.

## 2020-03-08 DIAGNOSIS — Z23 Encounter for immunization: Secondary | ICD-10-CM | POA: Diagnosis not present

## 2020-03-08 IMAGING — CT CT SHOULDER*R* W/O CM
3 of 4 series · 11 of 33 positions shown, 13 images · non-contrast
Comparison: Radiographs dated 03/27/2017

CLINICAL DATA: Acute right shoulder pain.

EXAM:
CT OF THE UPPER RIGHT EXTREMITY WITHOUT CONTRAST
TECHNIQUE: Multidetector CT imaging of the upper right extremity was performed
according to the standard protocol.

[Series 7: ax st · axial · 0.38mm/px · z∈[-291,-126]mm · 3 of 85 slices shown, 4 images]
[im 1/85  soft-tissue]
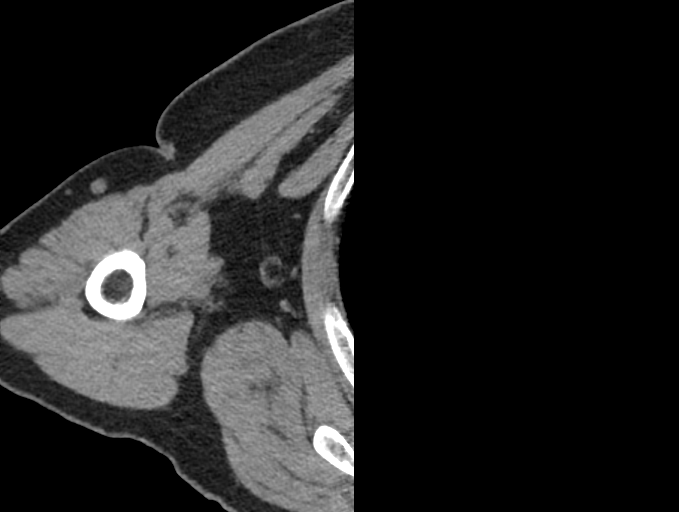
[im 1/85  bone]
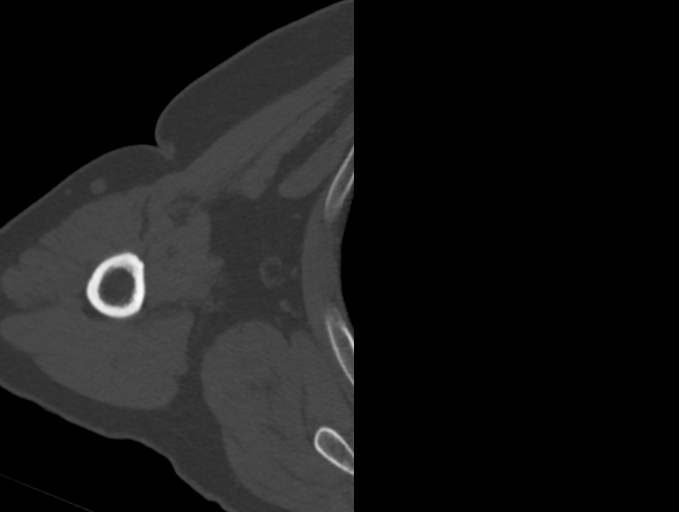
[im 43/85  bone]
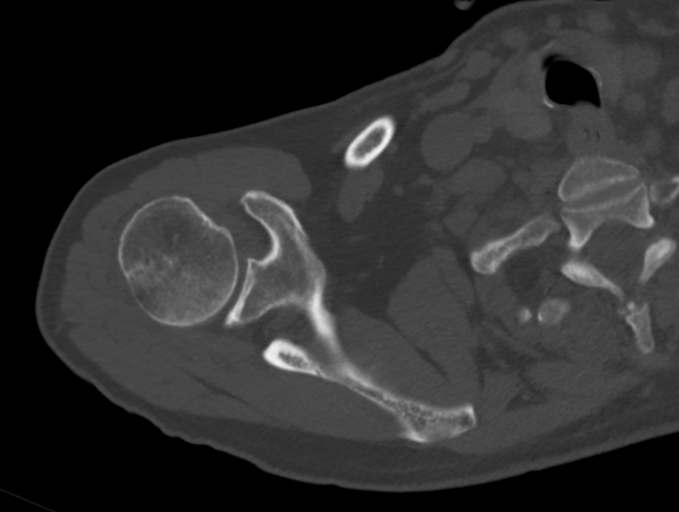
[im 85/85  bone]
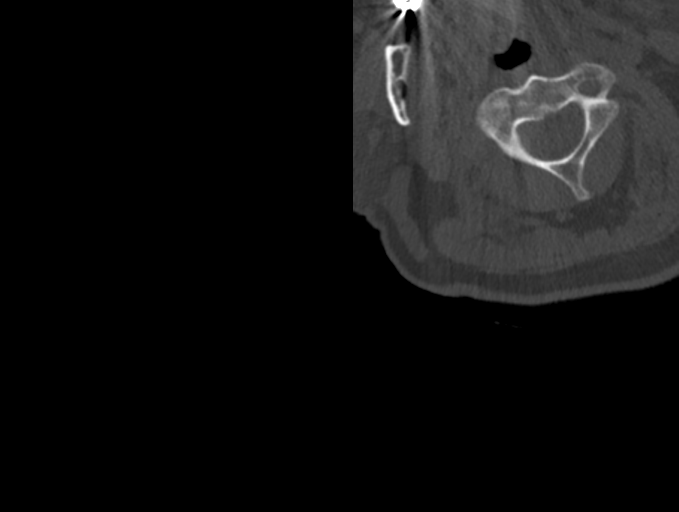

[Series 8: cor st · coronal · 0.32mm/px · 3 of 82 slices shown]
[im 17/82  bone]
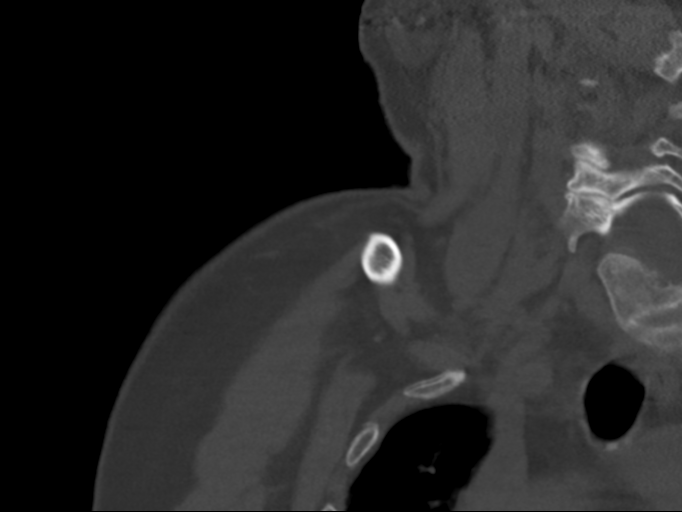
[im 33/82  bone]
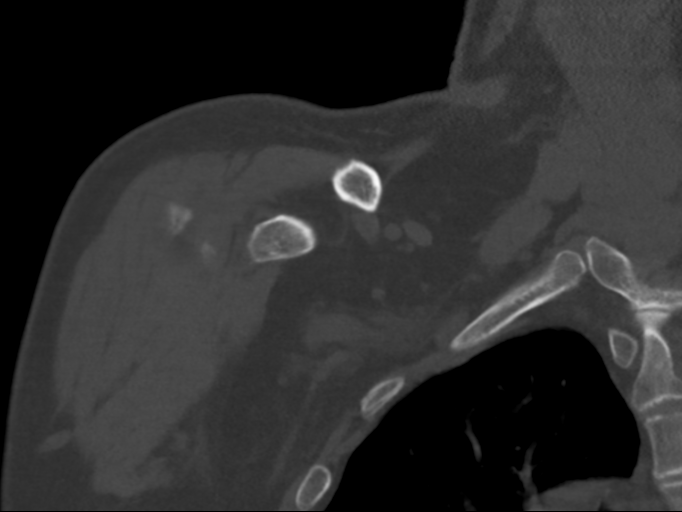
[im 49/82  bone]
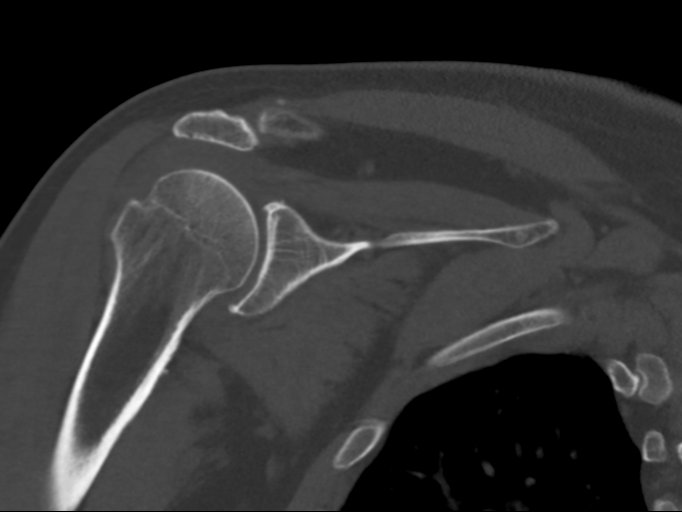

[Series 9: sag st · sagittal · 0.32mm/px · 5 of 87 slices shown, 6 images]
[im 29/87  bone]
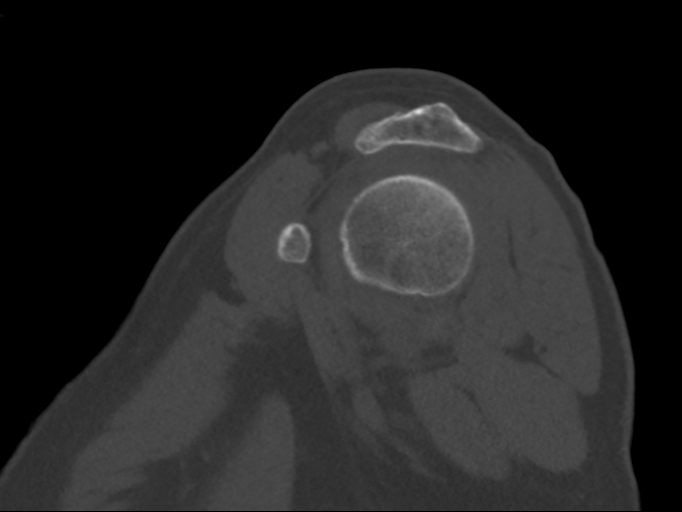
[im 36/87  bone]
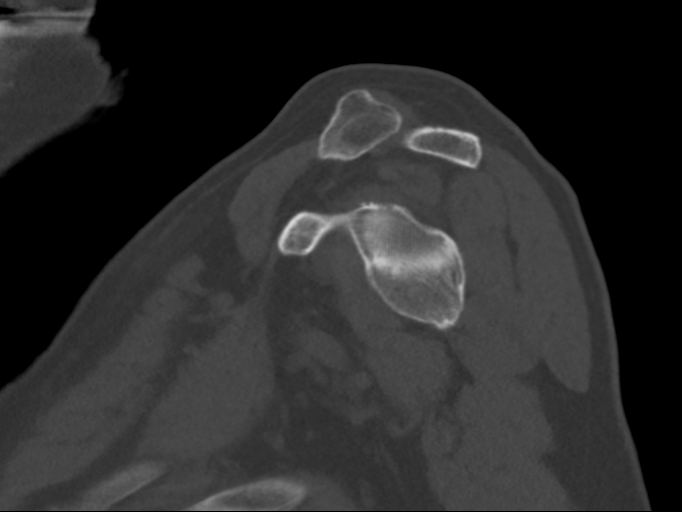
[im 44/87  soft-tissue]
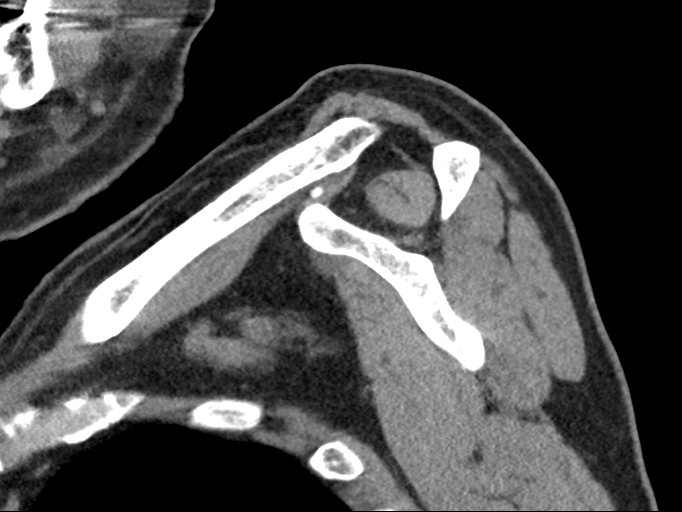
[im 44/87  bone]
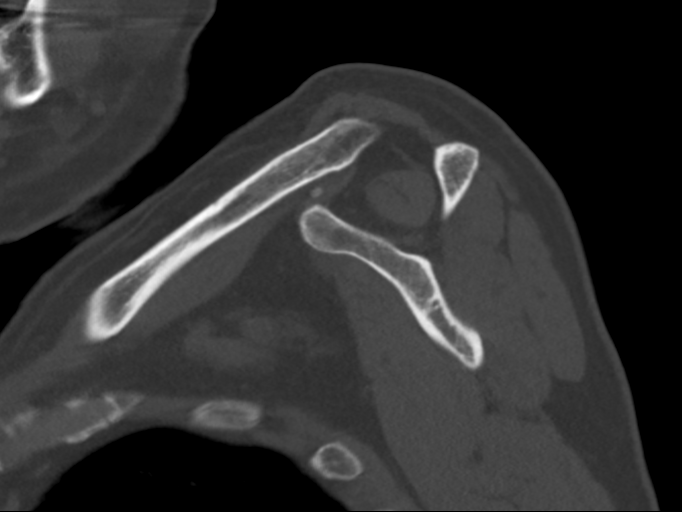
[im 51/87  bone]
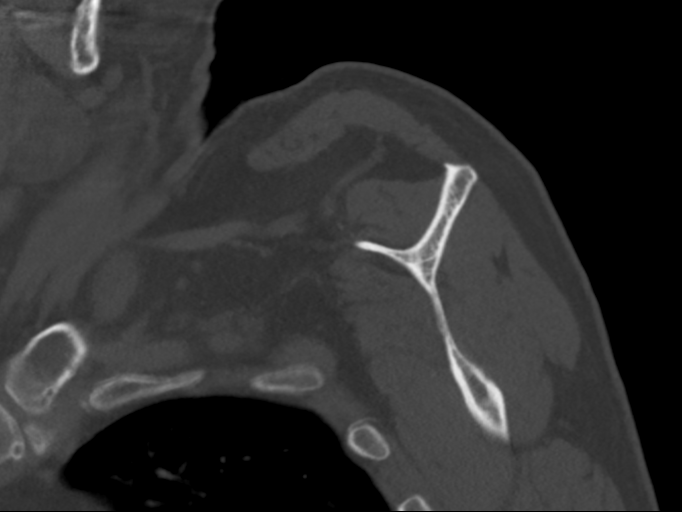
[im 58/87  bone]
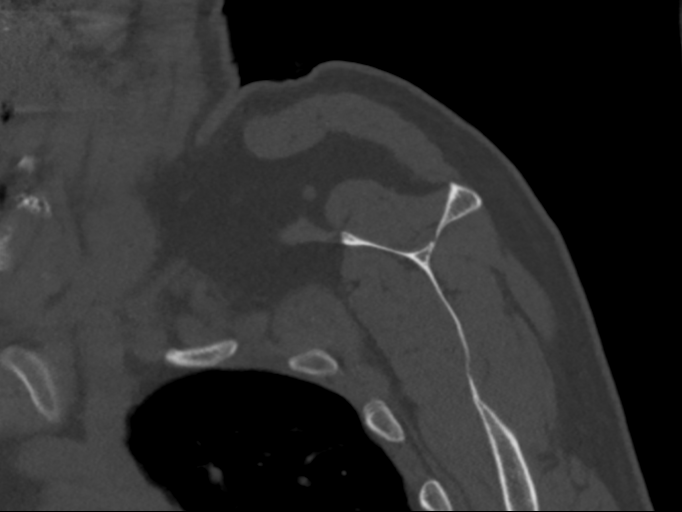

[11 of 33 positions shown; findings below may reference images not displayed]

FINDINGS: Bones/Joint/Cartilage

There is no fracture or dislocation. Small marginal osteophytes on
the glenoid. Minimal AC joint arthropathy. Type 2 acromion. No
appreciable joint effusions.

Muscles and Tendons

No atrophy or mass lesions or hematoma.

Soft tissues

Negative.
IMPRESSION: Minimal arthritic changes of the AC joint and glenohumeral joint.

## 2020-03-14 DIAGNOSIS — Z85828 Personal history of other malignant neoplasm of skin: Secondary | ICD-10-CM | POA: Diagnosis not present

## 2020-03-14 DIAGNOSIS — L814 Other melanin hyperpigmentation: Secondary | ICD-10-CM | POA: Diagnosis not present

## 2020-03-14 DIAGNOSIS — D225 Melanocytic nevi of trunk: Secondary | ICD-10-CM | POA: Diagnosis not present

## 2020-03-14 DIAGNOSIS — L579 Skin changes due to chronic exposure to nonionizing radiation, unspecified: Secondary | ICD-10-CM | POA: Diagnosis not present

## 2020-03-14 DIAGNOSIS — L821 Other seborrheic keratosis: Secondary | ICD-10-CM | POA: Diagnosis not present

## 2020-03-14 DIAGNOSIS — D040 Carcinoma in situ of skin of lip: Secondary | ICD-10-CM | POA: Diagnosis not present

## 2020-03-14 DIAGNOSIS — D485 Neoplasm of uncertain behavior of skin: Secondary | ICD-10-CM | POA: Diagnosis not present

## 2020-04-05 DIAGNOSIS — D040 Carcinoma in situ of skin of lip: Secondary | ICD-10-CM | POA: Diagnosis not present

## 2020-04-21 ENCOUNTER — Encounter: Payer: Self-pay | Admitting: Family Medicine

## 2020-04-21 ENCOUNTER — Ambulatory Visit (INDEPENDENT_AMBULATORY_CARE_PROVIDER_SITE_OTHER): Payer: Medicare Other | Admitting: Family Medicine

## 2020-04-21 DIAGNOSIS — J441 Chronic obstructive pulmonary disease with (acute) exacerbation: Secondary | ICD-10-CM

## 2020-04-21 MED ORDER — AZITHROMYCIN 250 MG PO TABS: ORAL_TABLET | ORAL | 0 refills | Status: DC

## 2020-04-21 MED ORDER — PREDNISONE 20 MG PO TABS: ORAL_TABLET | ORAL | 0 refills | Status: AC

## 2020-04-21 NOTE — Progress Notes (Signed)
Virtual Visit via Telephone Note  I connected with Blake Madden. on 04/21/20 at 8:06 AM by telephone and verified that I am speaking with the correct person using two identifiers. Blake Madden. is currently located at home and his wife is currently with him during this visit. The provider, Loman Brooklyn, FNP is located in their office at time of visit.  I discussed the limitations, risks, security and privacy concerns of performing an evaluation and management service by telephone and the availability of in person appointments. I also discussed with the patient that there may be a patient responsible charge related to this service. The patient expressed understanding and agreed to proceed.  Subjective: PCP: Sharion Balloon, FNP  Chief Complaint  Patient presents with  . Asthma   Patient complains of cough, chest congestion, shortness of breath and wheezing. Treatment to date: Albuterol inhaler. He has a history of asthma and COPD. He does not smoke.    ROS: Per HPI  Current Outpatient Medications:  .  albuterol (PROAIR HFA) 108 (90 BASE) MCG/ACT inhaler, Inhale 2 puffs into the lungs 4 (four) times daily as needed. (Patient taking differently: Inhale 2 puffs into the lungs 4 (four) times daily as needed for wheezing or shortness of breath. ), Disp: 3.7 g, Rfl: 11 .  albuterol (PROVENTIL) (2.5 MG/3ML) 0.083% nebulizer solution, Take 3 mLs (2.5 mg total) by nebulization every 4 (four) hours as needed for wheezing or shortness of breath., Disp: 75 mL, Rfl: 12 .  atorvastatin (LIPITOR) 20 MG tablet, Take 1 tablet (20 mg total) by mouth daily., Disp: 90 tablet, Rfl: 2 .  Budeson-Glycopyrrol-Formoterol (BREZTRI AEROSPHERE) 160-9-4.8 MCG/ACT AERO, Inhale 2 puffs into the lungs 2 (two) times daily., Disp: 10.7 g, Rfl: 0 .  montelukast (SINGULAIR) 10 MG tablet, TAKE 1 TABLET BY MOUTH EVERYDAY AT BEDTIME, Disp: 90 tablet, Rfl: 2 .  omeprazole (PRILOSEC) 40 MG capsule, TAKE 1 CAPSULE BY  MOUTH EVERY DAY, Disp: 90 capsule, Rfl: 3 .  QUEtiapine (SEROQUEL) 200 MG tablet, Take 1 tablet (200 mg total) by mouth at bedtime., Disp: 90 tablet, Rfl: 3 .  zolpidem (AMBIEN) 10 MG tablet, Take 1 tablet (10 mg total) by mouth at bedtime as needed for sleep., Disp: 30 tablet, Rfl: 5  Allergies  Allergen Reactions  . No Known Allergies    Past Medical History:  Diagnosis Date  . Allergic rhinitis   . Allergy   . Asthma   . BPH (benign prostatic hypertrophy)   . Depression   . GERD (gastroesophageal reflux disease)   . HTN (hypertension)   . Hyperlipemia   . Migraines     Observations/Objective: A&O  No respiratory distress or wheezing audible over the phone Mood, judgement, and thought processes all WNL  Assessment and Plan: 1. COPD exacerbation (HCC) - azithromycin (ZITHROMAX Z-PAK) 250 MG tablet; Take 2 tablets (500 mg) PO today, then 1 tablet (250 mg) PO daily x4 days.  Dispense: 6 tablet; Refill: 0 - predniSONE (DELTASONE) 20 MG tablet; Take 3 tablets (60 mg total) by mouth daily with breakfast for 7 days, THEN 2 tablets (40 mg total) daily with breakfast for 7 days, THEN 1 tablet (20 mg total) daily with breakfast for 7 days.  Dispense: 42 tablet; Refill: 0   Follow Up Instructions:  I discussed the assessment and treatment plan with the patient. The patient was provided an opportunity to ask questions and all were answered. The patient agreed with the plan  and demonstrated an understanding of the instructions.   The patient was advised to call back or seek an in-person evaluation if the symptoms worsen or if the condition fails to improve as anticipated.  The above assessment and management plan was discussed with the patient. The patient verbalized understanding of and has agreed to the management plan. Patient is aware to call the clinic if symptoms persist or worsen. Patient is aware when to return to the clinic for a follow-up visit. Patient educated on when it is  appropriate to go to the emergency department.   Time call ended: 8:13 AM  I provided 7 minutes of non-face-to-face time during this encounter.  Hendricks Limes, MSN, APRN, FNP-C [facility] 04/21/20

## 2020-04-24 ENCOUNTER — Ambulatory Visit: Payer: Medicare Other | Admitting: Family

## 2020-05-05 ENCOUNTER — Other Ambulatory Visit: Payer: Self-pay | Admitting: Family Medicine

## 2020-05-05 DIAGNOSIS — J441 Chronic obstructive pulmonary disease with (acute) exacerbation: Secondary | ICD-10-CM

## 2020-05-17 DIAGNOSIS — Z85828 Personal history of other malignant neoplasm of skin: Secondary | ICD-10-CM | POA: Diagnosis not present

## 2020-05-17 DIAGNOSIS — L579 Skin changes due to chronic exposure to nonionizing radiation, unspecified: Secondary | ICD-10-CM | POA: Diagnosis not present

## 2020-05-23 ENCOUNTER — Other Ambulatory Visit: Payer: Self-pay | Admitting: Family

## 2020-05-23 DIAGNOSIS — G4700 Insomnia, unspecified: Secondary | ICD-10-CM

## 2020-05-23 DIAGNOSIS — Z79899 Other long term (current) drug therapy: Secondary | ICD-10-CM

## 2020-05-26 ENCOUNTER — Other Ambulatory Visit: Payer: Self-pay

## 2020-05-26 ENCOUNTER — Ambulatory Visit (INDEPENDENT_AMBULATORY_CARE_PROVIDER_SITE_OTHER): Payer: Medicare Other | Admitting: Family

## 2020-05-26 ENCOUNTER — Encounter: Payer: Self-pay | Admitting: Family

## 2020-05-26 VITALS — BP 108/65 | HR 67 | Temp 97.8°F | Ht 67.0 in | Wt 151.2 lb

## 2020-05-26 DIAGNOSIS — G4700 Insomnia, unspecified: Secondary | ICD-10-CM | POA: Diagnosis not present

## 2020-05-26 DIAGNOSIS — Z79899 Other long term (current) drug therapy: Secondary | ICD-10-CM | POA: Diagnosis not present

## 2020-05-26 DIAGNOSIS — Z77090 Contact with and (suspected) exposure to asbestos: Secondary | ICD-10-CM | POA: Diagnosis not present

## 2020-05-26 DIAGNOSIS — J441 Chronic obstructive pulmonary disease with (acute) exacerbation: Secondary | ICD-10-CM | POA: Diagnosis not present

## 2020-05-26 DIAGNOSIS — E785 Hyperlipidemia, unspecified: Secondary | ICD-10-CM | POA: Diagnosis not present

## 2020-05-26 DIAGNOSIS — K2100 Gastro-esophageal reflux disease with esophagitis, without bleeding: Secondary | ICD-10-CM

## 2020-05-26 DIAGNOSIS — I10 Essential (primary) hypertension: Secondary | ICD-10-CM

## 2020-05-26 DIAGNOSIS — N401 Enlarged prostate with lower urinary tract symptoms: Secondary | ICD-10-CM

## 2020-05-26 DIAGNOSIS — F331 Major depressive disorder, recurrent, moderate: Secondary | ICD-10-CM

## 2020-05-26 DIAGNOSIS — J45901 Unspecified asthma with (acute) exacerbation: Secondary | ICD-10-CM

## 2020-05-26 LAB — CMP14+EGFR
ALT: 22 IU/L (ref 0–44)
AST: 20 IU/L (ref 0–40)
Albumin/Globulin Ratio: 2.1 (ref 1.2–2.2)
Albumin: 4 g/dL (ref 3.7–4.7)
Alkaline Phosphatase: 69 IU/L (ref 44–121)
BUN/Creatinine Ratio: 16 (ref 10–24)
BUN: 11 mg/dL (ref 8–27)
Bilirubin Total: 1.1 mg/dL (ref 0.0–1.2)
CO2: 25 mmol/L (ref 20–29)
Calcium: 9.3 mg/dL (ref 8.6–10.2)
Chloride: 103 mmol/L (ref 96–106)
Creatinine, Ser: 0.7 mg/dL — ABNORMAL LOW (ref 0.76–1.27)
Globulin, Total: 1.9 g/dL (ref 1.5–4.5)
Glucose: 98 mg/dL (ref 65–99)
Potassium: 4.4 mmol/L (ref 3.5–5.2)
Sodium: 140 mmol/L (ref 134–144)
Total Protein: 5.9 g/dL — ABNORMAL LOW (ref 6.0–8.5)
eGFR: 96 mL/min/{1.73_m2} (ref >59)

## 2020-05-26 LAB — CBC WITH DIFFERENTIAL/PLATELET
Basophils Absolute: 0.1 10*3/uL (ref 0.0–0.2)
Basos: 1 %
EOS (ABSOLUTE): 0.5 10*3/uL — ABNORMAL HIGH (ref 0.0–0.4)
Eos: 7 %
Hematocrit: 42.9 % (ref 37.5–51.0)
Hemoglobin: 14.6 g/dL (ref 13.0–17.7)
Immature Grans (Abs): 0 10*3/uL (ref 0.0–0.1)
Immature Granulocytes: 1 %
Lymphocytes Absolute: 2.4 10*3/uL (ref 0.7–3.1)
Lymphs: 37 %
MCH: 31.5 pg (ref 26.6–33.0)
MCHC: 34 g/dL (ref 31.5–35.7)
MCV: 93 fL (ref 79–97)
Monocytes Absolute: 0.7 10*3/uL (ref 0.1–0.9)
Monocytes: 11 %
Neutrophils Absolute: 2.8 10*3/uL (ref 1.4–7.0)
Neutrophils: 43 %
Platelets: 259 10*3/uL (ref 150–450)
RBC: 4.64 x10E6/uL (ref 4.14–5.80)
RDW: 12.9 % (ref 11.6–15.4)
WBC: 6.5 10*3/uL (ref 3.4–10.8)

## 2020-05-26 MED ORDER — ZOLPIDEM TARTRATE 10 MG PO TABS: 10.0000 mg | ORAL_TABLET | Freq: Every evening | ORAL | 5 refills | Status: DC | PRN

## 2020-05-26 MED ORDER — MONTELUKAST SODIUM 10 MG PO TABS: ORAL_TABLET | ORAL | 2 refills | Status: AC

## 2020-05-26 NOTE — Patient Instructions (Signed)
Health Maintenance After Age 76 After age 76, you are at a higher risk for certain long-term diseases and infections as well as injuries from falls. Falls are a major cause of broken bones and head injuries in people who are older than age 76. Getting regular preventive care can help to keep you healthy and well. Preventive care includes getting regular testing and making lifestyle changes as recommended by your health care provider. Talk with your health care provider about:  Which screenings and tests you should have. A screening is a test that checks for a disease when you have no symptoms.  A diet and exercise plan that is right for you. What should I know about screenings and tests to prevent falls? Screening and testing are the best ways to find a health problem early. Early diagnosis and treatment give you the best chance of managing medical conditions that are common after age 76. Certain conditions and lifestyle choices may make you more likely to have a fall. Your health care provider may recommend:  Regular vision checks. Poor vision and conditions such as cataracts can make you more likely to have a fall. If you wear glasses, make sure to get your prescription updated if your vision changes.  Medicine review. Work with your health care provider to regularly review all of the medicines you are taking, including over-the-counter medicines. Ask your health care provider about any side effects that may make you more likely to have a fall. Tell your health care provider if any medicines that you take make you feel dizzy or sleepy.  Osteoporosis screening. Osteoporosis is a condition that causes the bones to get weaker. This can make the bones weak and cause them to break more easily.  Blood pressure screening. Blood pressure changes and medicines to control blood pressure can make you feel dizzy.  Strength and balance checks. Your health care provider may recommend certain tests to check your  strength and balance while standing, walking, or changing positions.  Foot health exam. Foot pain and numbness, as well as not wearing proper footwear, can make you more likely to have a fall.  Depression screening. You may be more likely to have a fall if you have a fear of falling, feel emotionally low, or feel unable to do activities that you used to do.  Alcohol use screening. Using too much alcohol can affect your balance and may make you more likely to have a fall. What actions can I take to lower my risk of falls? General instructions  Talk with your health care provider about your risks for falling. Tell your health care provider if: ? You fall. Be sure to tell your health care provider about all falls, even ones that seem minor. ? You feel dizzy, sleepy, or off-balance.  Take over-the-counter and prescription medicines only as told by your health care provider. These include any supplements.  Eat a healthy diet and maintain a healthy weight. A healthy diet includes low-fat dairy products, low-fat (lean) meats, and fiber from whole grains, beans, and lots of fruits and vegetables. Home safety  Remove any tripping hazards, such as rugs, cords, and clutter.  Install safety equipment such as grab bars in bathrooms and safety rails on stairs.  Keep rooms and walkways well-lit. Activity  Follow a regular exercise program to stay fit. This will help you maintain your balance. Ask your health care provider what types of exercise are appropriate for you.  If you need a cane or walker,   use it as recommended by your health care provider.  Wear supportive shoes that have nonskid soles.   Lifestyle  Do not drink alcohol if your health care provider tells you not to drink.  If you drink alcohol, limit how much you have: ? 0-1 drink a day for women. ? 0-2 drinks a day for men.  Be aware of how much alcohol is in your drink. In the [place] one drink equals one typical bottle of beer (12  oz), one-half glass of wine (5 oz), or one shot of hard liquor (1 oz).  Do not use any products that contain nicotine or tobacco, such as cigarettes and e-cigarettes. If you need help quitting, ask your health care provider. Summary  Having a healthy lifestyle and getting preventive care can help to protect your health and wellness after age 76.  Screening and testing are the best way to find a health problem early and help you avoid having a fall. Early diagnosis and treatment give you the best chance for managing medical conditions that are more common for people who are older than age 76.  Falls are a major cause of broken bones and head injuries in people who are older than age 76. Take precautions to prevent a fall at home.  Work with your health care provider to learn what changes you can make to improve your health and wellness and to prevent falls. This information is not intended to replace advice given to you by your health care provider. Make sure you discuss any questions you have with your health care provider. Document Revised: 05/07/2018 Document Reviewed: 11/27/2016 Elsevier Patient Education  2021 Elsevier Inc.  

## 2020-05-26 NOTE — Progress Notes (Signed)
Subjective:    Patient ID: Blake Kinds., male    DOB: [birth year], 75 y.o.   MRN: [id number]  Chief Complaint  Patient presents with  . Hypertension    No concerns, not fastin    PT presents to the office today for chronic follow up.He is followed by Pulmonologist annually for asbestosis and pulmonary nodule. He gets a yearly CT scan.  Hypertension This is a chronic problem. The current episode started more than 1 year ago. The problem has been resolved since onset. The problem is controlled. Pertinent negatives include no malaise/fatigue, peripheral edema or shortness of breath. Risk factors for coronary artery disease include dyslipidemia, obesity and male gender. The current treatment provides moderate improvement.  Asthma He complains of cough and wheezing. There is no shortness of breath. This is a chronic problem. The current episode started more than 1 year ago. The problem occurs intermittently. The problem has been waxing and waning. Associated symptoms include heartburn. Pertinent negatives include no malaise/fatigue. He reports moderate improvement on treatment. His past medical history is significant for asthma.  Gastroesophageal Reflux He complains of belching, coughing, heartburn and wheezing. This is a chronic problem. The current episode started more than 1 year ago. The problem occurs occasionally. He has tried a PPI for the symptoms. The treatment provided moderate relief.  Benign Prostatic Hypertrophy This is a chronic problem. The current episode started more than 1 year ago. The problem has been resolved since onset. Irritative symptoms do not include nocturia.  Hyperlipidemia This is a chronic problem. The current episode started more than 1 year ago. The problem is uncontrolled. Pertinent negatives include no shortness of breath. Current antihyperlipidemic treatment includes statins. The current treatment provides mild improvement of lipids. Risk factors for coronary  artery disease include dyslipidemia, hypertension and a sedentary lifestyle.  Insomnia Primary symptoms: difficulty falling asleep, no malaise/fatigue.  The current episode started more than one year. The onset quality is gradual. The problem occurs intermittently. PMH includes: depression.  Depression        This is a chronic problem.  The current episode started more than 1 year ago.   The onset quality is gradual.   The problem occurs intermittently.  Associated symptoms include insomnia.     Review of Systems  Constitutional: Negative for malaise/fatigue.  Respiratory: Positive for cough and wheezing. Negative for shortness of breath.   Gastrointestinal: Positive for heartburn.  Genitourinary: Negative for nocturia.  Psychiatric/Behavioral: Positive for depression. The patient has insomnia.   All other systems reviewed and are negative.      Objective:   Physical Exam Vitals reviewed.  Constitutional:      General: He is not in acute distress.    Appearance: He is well-developed.  HENT:     Head: Normocephalic.     Right Ear: Tympanic membrane normal.     Left Ear: Tympanic membrane normal.  Eyes:     General:        Right eye: No discharge.        Left eye: No discharge.     Pupils: Pupils are equal, round, and reactive to light.  Neck:     Thyroid: No thyromegaly.  Cardiovascular:     Rate and Rhythm: Normal rate and regular rhythm.     Heart sounds: Normal heart sounds. No murmur heard.   Pulmonary:     Effort: Pulmonary effort is normal. No respiratory distress.     Breath sounds: Normal breath sounds. No  wheezing.  Abdominal:     General: Bowel sounds are normal. There is no distension.     Palpations: Abdomen is soft.     Tenderness: There is no abdominal tenderness.  Musculoskeletal:        General: No tenderness. Normal range of motion.     Cervical back: Normal range of motion and neck supple.  Skin:    General: Skin is warm and dry.     Findings: No  erythema or rash.  Neurological:     Mental Status: He is alert and oriented to person, place, and time.     Cranial Nerves: No cranial nerve deficit.     Deep Tendon Reflexes: Reflexes are normal and symmetric.  Psychiatric:        Behavior: Behavior normal.        Thought Content: Thought content normal.        Judgment: Judgment normal.          BP 108/65   Pulse 67   Temp 97.8 F (36.6 C) (Temporal)   Ht 5' 7"  (1.702 m)   Wt 151 lb 3.2 oz (68.6 kg)   BMI 23.68 kg/m   Assessment & Plan:  Blake Kinds. comes in today with chief complaint of Hypertension (No concerns, not fastin )   Diagnosis and orders addressed:  1. Controlled substance agreement signed - zolpidem (AMBIEN) 10 MG tablet; Take 1 tablet (10 mg total) by mouth at bedtime as needed for sleep.  Dispense: 30 tablet; Refill: 5 - CMP14+EGFR - CBC with Differential/Platelet  2. Insomnia, unspecified type - zolpidem (AMBIEN) 10 MG tablet; Take 1 tablet (10 mg total) by mouth at bedtime as needed for sleep.  Dispense: 30 tablet; Refill: 5 - CMP14+EGFR - CBC with Differential/Platelet  3. Asthma, chronic obstructive, with acute exacerbation (HCC) - montelukast (SINGULAIR) 10 MG tablet; TAKE 1 TABLET BY MOUTH EVERYDAY AT BEDTIME  Dispense: 90 tablet; Refill: 2 - CMP14+EGFR - CBC with Differential/Platelet  4. Primary hypertension - CMP14+EGFR - CBC with Differential/Platelet  5. Gastroesophageal reflux disease with esophagitis without hemorrhage - CMP14+EGFR - CBC with Differential/Platelet  6. Benign prostatic hyperplasia with lower urinary tract symptoms, symptom details unspecified - CMP14+EGFR - CBC with Differential/Platelet  7. Moderate episode of recurrent major depressive disorder (HCC) - CMP14+EGFR - CBC with Differential/Platelet  8. Hyperlipidemia, unspecified hyperlipidemia type - CMP14+EGFR - CBC with Differential/Platelet  9. Asbestos exposure - CMP14+EGFR - CBC with  Differential/Platelet   Labs pending Patient reviewed in [place] controlled database, no flags noted. Contract and drug screen are up to date.  Health Maintenance reviewed Diet and exercise encouraged  Follow up plan: 6 months   Evelina Dun, FNP

## 2020-07-05 ENCOUNTER — Encounter: Payer: Self-pay | Admitting: Family Medicine

## 2020-07-05 ENCOUNTER — Telehealth (INDEPENDENT_AMBULATORY_CARE_PROVIDER_SITE_OTHER): Payer: Medicare Other | Admitting: Family Medicine

## 2020-07-05 DIAGNOSIS — J449 Chronic obstructive pulmonary disease, unspecified: Secondary | ICD-10-CM

## 2020-07-05 DIAGNOSIS — J441 Chronic obstructive pulmonary disease with (acute) exacerbation: Secondary | ICD-10-CM

## 2020-07-05 MED ORDER — MOXIFLOXACIN HCL 400 MG PO TABS: 400.0000 mg | ORAL_TABLET | Freq: Every day | ORAL | 0 refills | Status: DC

## 2020-07-05 MED ORDER — BREZTRI AEROSPHERE 160-9-4.8 MCG/ACT IN AERO: 2.0000 | INHALATION_SPRAY | Freq: Two times a day (BID) | RESPIRATORY_TRACT | 2 refills | Status: DC

## 2020-07-05 MED ORDER — PREDNISONE 10 MG PO TABS: ORAL_TABLET | ORAL | 0 refills | Status: DC

## 2020-07-05 NOTE — Progress Notes (Signed)
Subjective:    Patient ID: Blake Kinds., male    DOB: 04-07-1945, 75 y.o.   MRN: [id number]   HPI: Blake Madden is a 75 y.o. male presenting for five days of cough and congestion. Denies fever. Moderate cough is dry. Dyspnea with moderate exertion noted. He is out of his Breztri inhaler, but continues to use the albuterol. Has required prednisone before, sometimes an extended course.    Depression screen [facility] 2/9 05/26/2020 05/26/2020 11/26/2019 11/16/2019 05/24/2019  Decreased Interest 0 0 0 0 0  Down, Depressed, Hopeless 0 0 0 0 0  PHQ - 2 Score 0 0 0 0 0  Altered sleeping 0 - 2 - 0  Tired, decreased energy 0 - 0 - 0  Change in appetite 0 - 0 - 0  Feeling bad or failure about yourself  0 - 0 - 0  Trouble concentrating 0 - 0 - 0  Moving slowly or fidgety/restless 0 - 0 - 0  Suicidal thoughts 0 - 0 - 0  PHQ-9 Score 0 - 2 - 0  Difficult doing work/chores Not difficult at all - Somewhat difficult - Not difficult at all  Some recent data might be hidden      Relevant past medical, surgical, family and social history reviewed and updated as indicated.  Interim medical history since our last visit reviewed. Allergies and medications reviewed and updated.  ROS:  Review of Systems  Constitutional: Positive for activity change. Negative for fatigue and fever.  HENT: Positive for congestion.   Respiratory: Positive for cough and shortness of breath.   Cardiovascular: Negative for chest pain and palpitations.     Social History   Tobacco Use  Smoking Status Never Smoker  Smokeless Tobacco Never Used       Objective:     Wt Readings from Last 3 Encounters:  05/26/20 151 lb 3.2 oz (68.6 kg)  11/26/19 158 lb 6.4 oz (71.8 kg)  08/25/19 151 lb 9.6 oz (68.8 kg)     Exam deferred. Pt. Harboring due to COVID 19.Virtual visit performed.   Assessment & Plan:   1. COPD exacerbation ([place])   2. Asthma-COPD overlap syndrome ([place])     Meds ordered this encounter   Medications  . Budeson-Glycopyrrol-Formoterol (BREZTRI AEROSPHERE) 160-9-4.8 MCG/ACT AERO    Sig: Inhale 2 puffs into the lungs 2 (two) times daily.    Dispense:  10.7 g    Refill:  2    Order Specific Question:   Lot Number?    Answer:   [id number]    Order Specific Question:   Expiration Date?    Answer:   08/28/2020    Order Specific Question:   Quantity    Answer:   4  . moxifloxacin (AVELOX) 400 MG tablet    Sig: Take 1 tablet (400 mg total) by mouth daily. Take all of these, for infection    Dispense:  7 tablet    Refill:  0  . predniSONE (DELTASONE) 10 MG tablet    Sig: Take 5 daily for 3 days followed by 4,3,2 and 1 for 3 days each.    Dispense:  45 tablet    Refill:  0    No orders of the defined types were placed in this encounter.     Diagnoses and all orders for this visit:  COPD exacerbation ([place])  Asthma-COPD overlap syndrome ([place])  Other orders -     Budeson-Glycopyrrol-Formoterol (BREZTRI AEROSPHERE) 160-9-4.8 MCG/ACT AERO;  Inhale 2 puffs into the lungs 2 (two) times daily. -     moxifloxacin (AVELOX) 400 MG tablet; Take 1 tablet (400 mg total) by mouth daily. Take all of these, for infection -     predniSONE (DELTASONE) 10 MG tablet; Take 5 daily for 3 days followed by 4,3,2 and 1 for 3 days each.    Virtual Visit via Video I discussed the limitations, risks, security and privacy concerns of performing an evaluation and management service by telephone and the availability of in person appointments. The patient was identified with two identifiers. Pt.expressed understanding and agreed to proceed. Pt. Is at home. Dr. Livia Snellen is in his office.  Follow Up Instructions:   I discussed the assessment and treatment plan with the patient. The patient was provided an opportunity to ask questions and all were answered. The patient agreed with the plan and demonstrated an understanding of the instructions.   The patient was advised to call back or seek an in-person  evaluation if the symptoms worsen or if the condition fails to improve as anticipated.   Total minutes including chart review and phone contact time: 14   Follow up plan: Return if symptoms worsen or fail to improve.  Claretta Fraise, MD [place]

## 2020-07-20 ENCOUNTER — Other Ambulatory Visit: Payer: Self-pay | Admitting: Family

## 2020-07-20 DIAGNOSIS — K2100 Gastro-esophageal reflux disease with esophagitis, without bleeding: Secondary | ICD-10-CM

## 2020-07-25 ENCOUNTER — Other Ambulatory Visit: Payer: Self-pay | Admitting: Family

## 2020-07-25 DIAGNOSIS — I10 Essential (primary) hypertension: Secondary | ICD-10-CM

## 2020-07-26 ENCOUNTER — Other Ambulatory Visit: Payer: Self-pay | Admitting: Family

## 2020-07-26 DIAGNOSIS — I10 Essential (primary) hypertension: Secondary | ICD-10-CM

## 2020-08-07 ENCOUNTER — Telehealth: Payer: Self-pay | Admitting: *Deleted

## 2020-08-07 NOTE — Telephone Encounter (Signed)
Patient states he takes metoprolol 25mg  I po qd

## 2020-08-07 NOTE — Telephone Encounter (Signed)
TC back RE: refill on Metoprolol, I have been denying refill since it was taken off his med list back in Oct 2020. Pt has had plenty of 90 day supplies and has always been taking it Needs refill sent to [facility]. Last OV 04/2020

## 2020-08-07 NOTE — Telephone Encounter (Signed)
Can we verify the dosing he is taking and send in for patient please.

## 2020-08-08 ENCOUNTER — Other Ambulatory Visit: Payer: Self-pay | Admitting: Family

## 2020-08-08 MED ORDER — METOPROLOL SUCCINATE ER 25 MG PO TB24: 25.0000 mg | ORAL_TABLET | Freq: Every day | ORAL | 1 refills | Status: DC

## 2020-09-11 ENCOUNTER — Encounter: Payer: Self-pay | Admitting: Nurse Practitioner

## 2020-09-11 ENCOUNTER — Ambulatory Visit (INDEPENDENT_AMBULATORY_CARE_PROVIDER_SITE_OTHER): Payer: Medicare Other | Admitting: Nurse Practitioner

## 2020-09-11 DIAGNOSIS — J45901 Unspecified asthma with (acute) exacerbation: Secondary | ICD-10-CM

## 2020-09-11 DIAGNOSIS — J449 Chronic obstructive pulmonary disease, unspecified: Secondary | ICD-10-CM | POA: Diagnosis not present

## 2020-09-11 DIAGNOSIS — J441 Chronic obstructive pulmonary disease with (acute) exacerbation: Secondary | ICD-10-CM | POA: Diagnosis not present

## 2020-09-11 MED ORDER — AZITHROMYCIN 250 MG PO TABS: ORAL_TABLET | ORAL | 0 refills | Status: AC

## 2020-09-11 MED ORDER — BREZTRI AEROSPHERE 160-9-4.8 MCG/ACT IN AERO: 2.0000 | INHALATION_SPRAY | Freq: Two times a day (BID) | RESPIRATORY_TRACT | 2 refills | Status: DC

## 2020-09-11 MED ORDER — ALBUTEROL SULFATE HFA 108 (90 BASE) MCG/ACT IN AERS: 2.0000 | INHALATION_SPRAY | Freq: Four times a day (QID) | RESPIRATORY_TRACT | 11 refills | Status: AC | PRN

## 2020-09-11 MED ORDER — PREDNISONE 20 MG PO TABS: 20.0000 mg | ORAL_TABLET | Freq: Every day | ORAL | 0 refills | Status: DC

## 2020-09-11 MED ORDER — ALBUTEROL SULFATE (2.5 MG/3ML) 0.083% IN NEBU: 2.5000 mg | INHALATION_SOLUTION | RESPIRATORY_TRACT | 12 refills | Status: AC | PRN

## 2020-09-11 MED ORDER — ALBUTEROL SULFATE (2.5 MG/3ML) 0.083% IN NEBU: 2.5000 mg | INHALATION_SOLUTION | RESPIRATORY_TRACT | 12 refills | Status: DC | PRN

## 2020-09-11 NOTE — Assessment & Plan Note (Signed)
Patient is reporting worsening cough, drainage, shortness of breath, headaches in the past few days.  Started patient on prednisone taper, azithromycin 250 mg tablet, 500 mg tablet day 1, 250 mg tablet day 2-5.  Continue breathing treatment as prescribed. Follow-up with worsening unresolved symptoms.

## 2020-09-11 NOTE — Progress Notes (Signed)
   Virtual Visit  Note Due to COVID-19 pandemic this visit was conducted virtually. This visit type was conducted due to national recommendations for restrictions regarding the COVID-19 Pandemic (e.g. social distancing, sheltering in place) in an effort to limit this patient's exposure and mitigate transmission in our community. All issues noted in this document were discussed and addressed.  A physical exam was not performed with this format.  I connected with Blake Kinds. on 09/11/20 at 9:30 AM by telephone and verified that I am speaking with the correct person using two identifiers. Blake Kinds. is currently located at home during visit. The provider, Ivy Lynn, NP is located in their office at time of visit.  I discussed the limitations, risks, security and privacy concerns of performing an evaluation and management service by telephone and the availability of in person appointments. I also discussed with the patient that there may be a patient responsible charge related to this service. The patient expressed understanding and agreed to proceed.   History and Present Illness:  Cough This is a chronic problem. The current episode started more than 1 month ago. The problem has been gradually worsening. The cough is Productive of sputum. Associated symptoms include headaches, nasal congestion and shortness of breath. Pertinent negatives include no chills or rash. Nothing aggravates the symptoms. His past medical history is significant for asthma and COPD.     Review of Systems  Constitutional:  Negative for chills.  HENT: Negative.    Eyes: Negative.   Respiratory:  Positive for cough and shortness of breath.   Cardiovascular: Negative.   Skin:  Negative for rash.  Neurological:  Positive for headaches.  All other systems reviewed and are negative.   Observations/Objective: Televisit patient not in distress.  Assessment and Plan: Patient is reporting worsening  cough, drainage, shortness of breath, headaches in the past few days.  Started patient on prednisone taper, azithromycin 250 mg tablet, 500 mg tablet day 1, 250 mg tablet day 2-5.  Continue breathing treatment as prescribed.   Follow Up Instructions: Follow-up with worsening unresolved symptoms.    I discussed the assessment and treatment plan with the patient. The patient was provided an opportunity to ask questions and all were answered. The patient agreed with the plan and demonstrated an understanding of the instructions.   The patient was advised to call back or seek an in-person evaluation if the symptoms worsen or if the condition fails to improve as anticipated.  The above assessment and management plan was discussed with the patient. The patient verbalized understanding of and has agreed to the management plan. Patient is aware to call the clinic if symptoms persist or worsen. Patient is aware when to return to the clinic for a follow-up visit. Patient educated on when it is appropriate to go to the emergency department.   Time call ended: 9:40 AM  I provided 10 minutes of  non face-to-face time during this encounter.    Ivy Lynn, NP

## 2020-09-12 DIAGNOSIS — L821 Other seborrheic keratosis: Secondary | ICD-10-CM | POA: Diagnosis not present

## 2020-09-12 DIAGNOSIS — L579 Skin changes due to chronic exposure to nonionizing radiation, unspecified: Secondary | ICD-10-CM | POA: Diagnosis not present

## 2020-09-12 DIAGNOSIS — L570 Actinic keratosis: Secondary | ICD-10-CM | POA: Diagnosis not present

## 2020-09-12 DIAGNOSIS — D225 Melanocytic nevi of trunk: Secondary | ICD-10-CM | POA: Diagnosis not present

## 2020-09-12 DIAGNOSIS — L905 Scar conditions and fibrosis of skin: Secondary | ICD-10-CM | POA: Diagnosis not present

## 2020-09-12 DIAGNOSIS — L814 Other melanin hyperpigmentation: Secondary | ICD-10-CM | POA: Diagnosis not present

## 2020-09-12 DIAGNOSIS — L820 Inflamed seborrheic keratosis: Secondary | ICD-10-CM | POA: Diagnosis not present

## 2020-09-12 DIAGNOSIS — D1801 Hemangioma of skin and subcutaneous tissue: Secondary | ICD-10-CM | POA: Diagnosis not present

## 2020-09-29 ENCOUNTER — Other Ambulatory Visit: Payer: Self-pay | Admitting: Nurse Practitioner

## 2020-09-29 DIAGNOSIS — J449 Chronic obstructive pulmonary disease, unspecified: Secondary | ICD-10-CM

## 2020-09-29 DIAGNOSIS — J45901 Unspecified asthma with (acute) exacerbation: Secondary | ICD-10-CM

## 2020-09-29 DIAGNOSIS — J441 Chronic obstructive pulmonary disease with (acute) exacerbation: Secondary | ICD-10-CM

## 2020-10-18 DIAGNOSIS — H4912 Fourth [trochlear] nerve palsy, left eye: Secondary | ICD-10-CM | POA: Diagnosis not present

## 2020-10-25 DIAGNOSIS — J449 Chronic obstructive pulmonary disease, unspecified: Secondary | ICD-10-CM | POA: Diagnosis not present

## 2020-10-25 DIAGNOSIS — H532 Diplopia: Secondary | ICD-10-CM | POA: Diagnosis not present

## 2020-10-25 DIAGNOSIS — I6782 Cerebral ischemia: Secondary | ICD-10-CM | POA: Diagnosis not present

## 2020-11-01 DIAGNOSIS — H4912 Fourth [trochlear] nerve palsy, left eye: Secondary | ICD-10-CM | POA: Diagnosis not present

## 2020-11-01 DIAGNOSIS — H532 Diplopia: Secondary | ICD-10-CM | POA: Diagnosis not present

## 2020-11-03 DIAGNOSIS — H532 Diplopia: Secondary | ICD-10-CM | POA: Diagnosis not present

## 2020-11-03 DIAGNOSIS — H4912 Fourth [trochlear] nerve palsy, left eye: Secondary | ICD-10-CM | POA: Diagnosis not present

## 2020-11-13 DIAGNOSIS — Z9889 Other specified postprocedural states: Secondary | ICD-10-CM | POA: Diagnosis not present

## 2020-11-13 DIAGNOSIS — H25813 Combined forms of age-related cataract, bilateral: Secondary | ICD-10-CM | POA: Diagnosis not present

## 2020-11-13 DIAGNOSIS — H4912 Fourth [trochlear] nerve palsy, left eye: Secondary | ICD-10-CM | POA: Diagnosis not present

## 2020-11-16 ENCOUNTER — Ambulatory Visit (INDEPENDENT_AMBULATORY_CARE_PROVIDER_SITE_OTHER): Payer: Medicare Other

## 2020-11-16 VITALS — Ht 67.0 in | Wt 145.0 lb

## 2020-11-16 DIAGNOSIS — M7150 Other bursitis, not elsewhere classified, unspecified site: Secondary | ICD-10-CM | POA: Insufficient documentation

## 2020-11-16 DIAGNOSIS — F330 Major depressive disorder, recurrent, mild: Secondary | ICD-10-CM | POA: Insufficient documentation

## 2020-11-16 DIAGNOSIS — M5412 Radiculopathy, cervical region: Secondary | ICD-10-CM | POA: Insufficient documentation

## 2020-11-16 DIAGNOSIS — K222 Esophageal obstruction: Secondary | ICD-10-CM | POA: Insufficient documentation

## 2020-11-16 DIAGNOSIS — IMO0001 Reserved for inherently not codable concepts without codable children: Secondary | ICD-10-CM | POA: Insufficient documentation

## 2020-11-16 DIAGNOSIS — Z0000 Encounter for general adult medical examination without abnormal findings: Secondary | ICD-10-CM | POA: Diagnosis not present

## 2020-11-16 DIAGNOSIS — D126 Benign neoplasm of colon, unspecified: Secondary | ICD-10-CM | POA: Insufficient documentation

## 2020-11-16 DIAGNOSIS — J61 Pneumoconiosis due to asbestos and other mineral fibers: Secondary | ICD-10-CM | POA: Insufficient documentation

## 2020-11-16 DIAGNOSIS — L989 Disorder of the skin and subcutaneous tissue, unspecified: Secondary | ICD-10-CM | POA: Insufficient documentation

## 2020-11-16 DIAGNOSIS — D100 Benign neoplasm of lip: Secondary | ICD-10-CM | POA: Insufficient documentation

## 2020-11-16 DIAGNOSIS — J309 Allergic rhinitis, unspecified: Secondary | ICD-10-CM | POA: Insufficient documentation

## 2020-11-16 NOTE — Progress Notes (Signed)
Subjective:   Blake Madden. is a 75 y.o. male who presents for Medicare Annual/Subsequent preventive examination.  Virtual Visit via Telephone Note  I connected with  Blake Madden. on 11/16/20 at  9:45 AM EDT by telephone and verified that I am speaking with the correct person using two identifiers.  Location: Patient: Home Provider: [facility] Persons participating in the virtual visit: patient/Nurse Health Advisor   I discussed the limitations, risks, security and privacy concerns of performing an evaluation and management service by telephone and the availability of in person appointments. The patient expressed understanding and agreed to proceed.  Interactive audio and video telecommunications were attempted between this nurse and patient, however failed, due to patient having technical difficulties OR patient did not have access to video capability.  We continued and completed visit with audio only.  Some vital signs may be absent or patient reported.   Shulamit Donofrio E Angelino Rumery, LPN   Review of Systems     Cardiac Risk Factors include: advanced age (>62men, >54 women);male gender;dyslipidemia;hypertension;Other (see comment), Risk factor comments: COPD     Objective:    Today's Vitals   11/16/20 0954  Weight: 145 lb (65.8 kg)  Height: 5\' 7"  (1.702 m)   Body mass index is 22.71 kg/m.  Advanced Directives 11/16/2020 11/16/2019 11/10/2018 10/01/2017 07/01/2017 11/17/2016 02/01/2016  Does Patient Have a Medical Advance Directive? No No No No No No No  Would patient like information on creating a medical advance directive? No - Patient declined No - Patient declined No - Patient declined No - Patient declined - - Yes (MAU/Ambulatory/Procedural Areas - Information given)    Current Medications (verified) Outpatient Encounter Medications as of 11/16/2020  Medication Sig   albuterol (PROAIR HFA) 108 (90 Base) MCG/ACT inhaler Inhale 2 puffs into the lungs 4 (four) times daily as needed.    albuterol (PROVENTIL) (2.5 MG/3ML) 0.083% nebulizer solution Take 3 mLs (2.5 mg total) by nebulization every 4 (four) hours as needed for wheezing or shortness of breath.   atorvastatin (LIPITOR) 40 MG tablet Take by mouth.   Fluticasone-Umeclidin-Vilant (TRELEGY ELLIPTA) 100-62.5-25 MCG/ACT AEPB Inhale into the lungs.   metoprolol succinate (TOPROL XL) 25 MG 24 hr tablet Take 1 tablet (25 mg total) by mouth daily.   montelukast (SINGULAIR) 10 MG tablet TAKE 1 TABLET BY MOUTH EVERYDAY AT BEDTIME   omeprazole (PRILOSEC) 40 MG capsule TAKE 1 CAPSULE BY MOUTH EVERY DAY   [DISCONTINUED] Fluticasone-Umeclidin-Vilant (TRELEGY ELLIPTA) 100-62.5-25 MCG/ACT AEPB Inhale into the lungs.   [DISCONTINUED] montelukast (SINGULAIR) 10 MG tablet Take 1 tablet by mouth at bedtime.   [DISCONTINUED] omeprazole (PRILOSEC) 40 MG capsule Take 1 tablet by mouth daily.   Budeson-Glycopyrrol-Formoterol (BREZTRI AEROSPHERE) 160-9-4.8 MCG/ACT AERO Inhale 2 puffs into the lungs 2 (two) times daily. (Patient not taking: Reported on 11/16/2020)   diphenhydramine-acetaminophen (TYLENOL PM) 25-500 MG TABS tablet Take 1 tablet by mouth at bedtime as needed.   moxifloxacin (AVELOX) 400 MG tablet Take 1 tablet (400 mg total) by mouth daily. Take all of these, for infection (Patient not taking: Reported on 11/16/2020)   predniSONE (DELTASONE) 20 MG tablet Take 1 tablet (20 mg total) by mouth daily with breakfast. Please use this instructions (Take 3 tablet (60 mg tablet) For 7 days, then take 2 tablet (40 mg tab) daily with breakfast for 7 days daily with breakfast, then 1 tablet (20 mg tablet) daily with breakfast for 7 days) (Patient not taking: Reported on 11/16/2020)   QUEtiapine (SEROQUEL)  200 MG tablet Take 1 tablet (200 mg total) by mouth at bedtime. (Patient not taking: Reported on 11/16/2020)   zolpidem (AMBIEN) 10 MG tablet Take 1 tablet (10 mg total) by mouth at bedtime as needed for sleep.   No facility-administered  encounter medications on file as of 11/16/2020.    Allergies (verified) No known allergies   History: Past Medical History:  Diagnosis Date   Allergic rhinitis    Allergy    Asthma    BPH (benign prostatic hypertrophy)    Depression    GERD (gastroesophageal reflux disease)    HTN (hypertension)    Hyperlipemia    Migraines    Past Surgical History:  Procedure Laterality Date   INGUINAL HERNIA REPAIR Bilateral 02/07/2016   Procedure: LAPAROSCOPIC BILATERAL INGUINAL HERNIA REPAIR WITH UMBILICIAL HERNIA REPAIR;  Surgeon: Coralie Keens, MD;  Location: [facility];  Service: General;  Laterality: Bilateral;   INSERTION OF MESH Bilateral 02/07/2016   Procedure: INSERTION OF MESH TO INGUINAL HERNIAS;  Surgeon: Coralie Keens, MD;  Location: [facility];  Service: General;  Laterality: Bilateral;   KNEE ARTHROSCOPY Left    Family History  Problem Relation Age of Onset   Lung cancer Father        lung   Bladder Cancer Father    Colon cancer Sister    Colon cancer Brother    Social History   Socioeconomic History   Marital status: Significant Other    Spouse name: Not on file   Number of children: 3   Years of education: 12   Highest education level: 12th grade  Occupational History   Occupation: full time    Fish farm manager: [employer]  Tobacco Use   Smoking status: Never   Smokeless tobacco: Never  Vaping Use   Vaping Use: Never used  Substance and Sexual Activity   Alcohol use: No    Alcohol/week: 2.0 standard drinks    Types: 2 Cans of beer per week    Comment: rare   Drug use: No   Sexual activity: Not on file  Other Topics Concern   Not on file  Social History Narrative   Lives with significant other   Social Determinants of Health   Financial Resource Strain: Not on file  Food Insecurity: Not on file  Transportation Needs: Not on file  Physical Activity: Sufficiently Active   Days of Exercise per Week: 7 days   Minutes of Exercise per Session: 30 min   Stress: Not on file  Social Connections: Not on file    Tobacco Counseling Counseling given: Not Answered   Clinical Intake:  Pre-visit preparation completed: Yes  Pain : No/denies pain     BMI - recorded: 22.71 Nutritional Status: BMI of 19-24  Normal Nutritional Risks: None Diabetes: No  How often do you need to have someone help you when you read instructions, pamphlets, or other written materials from your doctor or pharmacy?: 1 - Never  Diabetic? No  Interpreter Needed?: No  Information entered by :: Kamuela Magos, LPN   Activities of Daily Living In your present state of health, do you have any difficulty performing the following activities: 11/16/2020  Hearing? N  Vision? N  Difficulty concentrating or making decisions? N  Walking or climbing stairs? N  Dressing or bathing? N  Doing errands, shopping? N  Preparing Food and eating ? N  Using the Toilet? N  In the past six months, have you accidently leaked urine? N  Do you  have problems with loss of bowel control? N  Managing your Medications? N  Managing your Finances? N  Housekeeping or managing your Housekeeping? N  Some recent data might be hidden    Patient Care Team: Sharion Balloon, FNP as PCP - General (Family Medicine)  Indicate any recent Medical Services you may have received from other than [facility] providers in the past year (date may be approximate).     Assessment:   This is a routine wellness examination for Gwyndolyn Saxon.  Hearing/Vision screen Hearing Screening - Comments:: Denies hearing difficulties  Vision Screening - Comments:: Denies vision difficulties  -up to date with annual eye exams with Dr Sanjuana Kava in [place] issues and exercise activities discussed: Current Exercise Habits: Home exercise routine, Type of exercise: walking, Time (Minutes): 30, Frequency (Times/Week): 7, Weekly Exercise (Minutes/Week): 210, Intensity: Mild, Exercise limited by: respiratory  conditions(s)   Goals Addressed   None    Depression Screen PHQ 2/9 Scores 05/26/2020 05/26/2020 11/26/2019 11/16/2019 05/24/2019 11/10/2018 11/03/2018  PHQ - 2 Score 0 0 0 0 0 0 0  PHQ- 9 Score 0 - 2 - 0 - 0    Fall Risk Fall Risk  05/26/2020 11/26/2019 11/16/2019 11/10/2018 11/03/2018  Falls in the past year? 0 0 0 0 0  Number falls in past yr: - - - - -  Injury with Fall? - - - - -  Comment - - - - -    FALL RISK PREVENTION PERTAINING TO THE HOME:  Any stairs in or around the home? Yes  If so, are there any without handrails? No  Home free of loose throw rugs in walkways, pet beds, electrical cords, etc? Yes  Adequate lighting in your home to reduce risk of falls? Yes   ASSISTIVE DEVICES UTILIZED TO PREVENT FALLS:  Life alert? No  Use of a cane, walker or w/c? No  Grab bars in the bathroom? No  Shower chair or bench in shower? No  Elevated toilet seat or a handicapped toilet? No   TIMED UP AND GO:  Was the test performed? No . Telephonic visit  Cognitive Function: Normal cognitive status assessed by direct observation by this Nurse Health Advisor. No abnormalities found.    MMSE - Mini Mental State Exam 10/01/2017  Orientation to time 5  Orientation to Place 5  Registration 3  Attention/ Calculation 5  Recall 1  Language- name 2 objects 2  Language- repeat 1  Language- follow 3 step command 3  Language- read & follow direction 1  Write a sentence 1  Copy design 1  Total score 28     6CIT Screen 11/16/2019 11/10/2018  What Year? 0 points 0 points  What month? 0 points 0 points  What time? 0 points 0 points  Count back from 20 0 points 0 points  Months in reverse 0 points 0 points  Repeat phrase 0 points 2 points  Total Score 0 2    Immunizations Immunization History  Administered Date(s) Administered   Fluad Quad(high Dose 65+) 11/03/2018, 02/01/2020   Influenza, High Dose Seasonal PF 02/01/2017, 11/03/2018, 02/01/2020   Influenza,inj,Quad PF,6+ Mos  03/09/2014, 12/19/2014   Influenza-Unspecified 10/28/2017   Moderna Sars-Covid-2 Vaccination 04/07/2019, 05/05/2019, 03/08/2020   Pneumococcal Conjugate-13 03/09/2014   Pneumococcal Polysaccharide-23 07/08/2016   Tdap 11/03/2018    TDAP status: Up to date  Flu Vaccine status: Up to date  Pneumococcal vaccine status: Up to date  Covid-19 vaccine status: Information provided on  how to obtain vaccines.   Qualifies for Shingles Vaccine? Yes   Zostavax completed No   Shingrix Completed?: No.    Education has been provided regarding the importance of this vaccine. Patient has been advised to call insurance company to determine out of pocket expense if they have not yet received this vaccine. Advised may also receive vaccine at local pharmacy or Health Dept. Verbalized acceptance and understanding.  Screening Tests Health Maintenance  Topic Date Due   Zoster Vaccines- Shingrix (1 of 2) Never done   COVID-19 Vaccine (4 - Booster for Moderna series) 05/03/2020   INFLUENZA VACCINE  08/28/2020   TETANUS/TDAP  11/02/2028   Pneumonia Vaccine 50+ Years old  Completed   Hepatitis C Screening  Completed   HPV VACCINES  Aged Out    Health Maintenance  Health Maintenance Due  Topic Date Due   Zoster Vaccines- Shingrix (1 of 2) Never done   COVID-19 Vaccine (4 - Booster for Moderna series) 05/03/2020   INFLUENZA VACCINE  08/28/2020    Colorectal cancer screening: No longer required.   Lung Cancer Screening: (Low Dose CT Chest recommended if Age 15-80 years, 30 pack-year currently smoking OR have quit w/in 15years.) does not qualify.   Additional Screening:  Hepatitis C Screening: does qualify; Completed 07/08/2016  Vision Screening: Recommended annual ophthalmology exams for early detection of glaucoma and other disorders of the eye. Is the patient up to date with their annual eye exam?  Yes  Who is the provider or what is the name of the office in which the patient attends annual eye  exams? [place] If pt is not established with a provider, would they like to be referred to a provider to establish care? No .   Dental Screening: Recommended annual dental exams for proper oral hygiene  Community Resource Referral / Chronic Care Management: CRR required this visit?  No   CCM required this visit?  No      Plan:     I have personally reviewed and noted the following in the patient's chart:   Medical and social history Use of alcohol, tobacco or illicit drugs  Current medications and supplements including opioid prescriptions. Patient is not currently taking opioid prescriptions. Functional ability and status Nutritional status Physical activity Advanced directives List of other physicians Hospitalizations, surgeries, and ER visits in previous 12 months Vitals Screenings to include cognitive, depression, and falls Referrals and appointments  In addition, I have reviewed and discussed with patient certain preventive protocols, quality metrics, and best practice recommendations. A written personalized care plan for preventive services as well as general preventive health recommendations were provided to patient.     Sandrea Hammond, LPN   [id number]   Nurse Notes: None

## 2020-11-16 NOTE — Patient Instructions (Addendum)
Blake Madden , Thank you for taking time to come for your Medicare Wellness Visit. I appreciate your ongoing commitment to your health goals. Please review the following plan we discussed and let me know if I can assist you in the future.   Screening recommendations/referrals: Colonoscopy: Done 04/25/2015 Recommended yearly ophthalmology/optometry visit for glaucoma screening and checkup Recommended yearly dental visit for hygiene and checkup  Vaccinations: Influenza vaccine: Due. Every fall Pneumococcal vaccine: Done 03/09/2014 & 07/08/2016 Tdap vaccine: Done 11/03/2018 - Repeat in 10 years Shingles vaccine: Due.   Covid-19: Done 04/07/2019, 05/05/2019, & 03/08/2020  Advanced directives: Please bring a copy of your health care power of attorney and living will to the office to be added to your chart at your convenience.   Conditions/risks identified: Aim for 30 minutes of exercise or brisk walking each day, drink 6-8 glasses of water and eat lots of fruits and vegetables.   Next appointment: Follow up in one year for your annual wellness visit.   Preventive Care 76 Years and Older, Male  Preventive care refers to lifestyle choices and visits with your health care provider that can promote health and wellness. What does preventive care include? A yearly physical exam. This is also called an annual well check. Dental exams once or twice a year. Routine eye exams. Ask your health care provider how often you should have your eyes checked. Personal lifestyle choices, including: Daily care of your teeth and gums. Regular physical activity. Eating a healthy diet. Avoiding tobacco and drug use. Limiting alcohol use. Practicing safe sex. Taking low doses of aspirin every day. Taking vitamin and mineral supplements as recommended by your health care provider. What happens during an annual well check? The services and screenings done by your health care provider during your annual well check will  depend on your age, overall health, lifestyle risk factors, and family history of disease. Counseling  Your health care provider may ask you questions about your: Alcohol use. Tobacco use. Drug use. Emotional well-being. Home and relationship well-being. Sexual activity. Eating habits. History of falls. Memory and ability to understand (cognition). Work and work Statistician. Screening  You may have the following tests or measurements: Height, weight, and BMI. Blood pressure. Lipid and cholesterol levels. These may be checked every 5 years, or more frequently if you are over 55 years old. Skin check. Lung cancer screening. You may have this screening every year starting at age 35 if you have a 30-pack-year history of smoking and currently smoke or have quit within the past 15 years. Fecal occult blood test (FOBT) of the stool. You may have this test every year starting at age 47. Flexible sigmoidoscopy or colonoscopy. You may have a sigmoidoscopy every 5 years or a colonoscopy every 10 years starting at age 16. Prostate cancer screening. Recommendations will vary depending on your family history and other risks. Hepatitis C blood test. Hepatitis B blood test. Sexually transmitted disease (STD) testing. Diabetes screening. This is done by checking your blood sugar (glucose) after you have not eaten for a while (fasting). You may have this done every 1-3 years. Abdominal aortic aneurysm (AAA) screening. You may need this if you are a current or former smoker. Osteoporosis. You may be screened starting at age 73 if you are at high risk. Talk with your health care provider about your test results, treatment options, and if necessary, the need for more tests. Vaccines  Your health care provider may recommend certain vaccines, such as: Influenza vaccine.  This is recommended every year. Tetanus, diphtheria, and acellular pertussis (Tdap, Td) vaccine. You may need a Td booster every 10  years. Zoster vaccine. You may need this after age 56. Pneumococcal 13-valent conjugate (PCV13) vaccine. One dose is recommended after age 41. Pneumococcal polysaccharide (PPSV23) vaccine. One dose is recommended after age 68. Talk to your health care provider about which screenings and vaccines you need and how often you need them. This information is not intended to replace advice given to you by your health care provider. Make sure you discuss any questions you have with your health care provider. Document Released: 02/10/2015 Document Revised: 10/04/2015 Document Reviewed: 11/15/2014 Elsevier Interactive Patient Education  2017 [employer] Prevention in the Home Falls can cause injuries. They can happen to people of all ages. There are many things you can do to make your home safe and to help prevent falls. What can I do on the outside of my home? Regularly fix the edges of walkways and driveways and fix any cracks. Remove anything that might make you trip as you walk through a door, such as a raised step or threshold. Trim any bushes or trees on the path to your home. Use bright outdoor lighting. Clear any walking paths of anything that might make someone trip, such as rocks or tools. Regularly check to see if handrails are loose or broken. Make sure that both sides of any steps have handrails. Any raised decks and porches should have guardrails on the edges. Have any leaves, snow, or ice cleared regularly. Use sand or salt on walking paths during winter. Clean up any spills in your garage right away. This includes oil or grease spills. What can I do in the bathroom? Use night lights. Install grab bars by the toilet and in the tub and shower. Do not use towel bars as grab bars. Use non-skid mats or decals in the tub or shower. If you need to sit down in the shower, use a plastic, non-slip stool. Keep the floor dry. Clean up any water that spills on the floor as soon as it  happens. Remove soap buildup in the tub or shower regularly. Attach bath mats securely with double-sided non-slip rug tape. Do not have throw rugs and other things on the floor that can make you trip. What can I do in the bedroom? Use night lights. Make sure that you have a light by your bed that is easy to reach. Do not use any sheets or blankets that are too big for your bed. They should not hang down onto the floor. Have a firm chair that has side arms. You can use this for support while you get dressed. Do not have throw rugs and other things on the floor that can make you trip. What can I do in the kitchen? Clean up any spills right away. Avoid walking on wet floors. Keep items that you use a lot in easy-to-reach places. If you need to reach something above you, use a strong step stool that has a grab bar. Keep electrical cords out of the way. Do not use floor polish or wax that makes floors slippery. If you must use wax, use non-skid floor wax. Do not have throw rugs and other things on the floor that can make you trip. What can I do with my stairs? Do not leave any items on the stairs. Make sure that there are handrails on both sides of the stairs and use them. Fix handrails  that are broken or loose. Make sure that handrails are as long as the stairways. Check any carpeting to make sure that it is firmly attached to the stairs. Fix any carpet that is loose or worn. Avoid having throw rugs at the top or bottom of the stairs. If you do have throw rugs, attach them to the floor with carpet tape. Make sure that you have a light switch at the top of the stairs and the bottom of the stairs. If you do not have them, ask someone to add them for you. What else can I do to help prevent falls? Wear shoes that: Do not have high heels. Have rubber bottoms. Are comfortable and fit you well. Are closed at the toe. Do not wear sandals. If you use a stepladder: Make sure that it is fully opened.  Do not climb a closed stepladder. Make sure that both sides of the stepladder are locked into place. Ask someone to hold it for you, if possible. Clearly mark and make sure that you can see: Any grab bars or handrails. First and last steps. Where the edge of each step is. Use tools that help you move around (mobility aids) if they are needed. These include: Canes. Walkers. Scooters. Crutches. Turn on the lights when you go into a dark area. Replace any light bulbs as soon as they burn out. Set up your furniture so you have a clear path. Avoid moving your furniture around. If any of your floors are uneven, fix them. If there are any pets around you, be aware of where they are. Review your medicines with your doctor. Some medicines can make you feel dizzy. This can increase your chance of falling. Ask your doctor what other things that you can do to help prevent falls. This information is not intended to replace advice given to you by your health care provider. Make sure you discuss any questions you have with your health care provider. Document Released: 11/10/2008 Document Revised: 06/22/2015 Document Reviewed: 02/18/2014 Elsevier Interactive Patient Education  2017 [employer].

## 2020-11-20 ENCOUNTER — Ambulatory Visit (INDEPENDENT_AMBULATORY_CARE_PROVIDER_SITE_OTHER): Payer: Medicare Other | Admitting: Family

## 2020-11-20 ENCOUNTER — Other Ambulatory Visit: Payer: Self-pay

## 2020-11-20 ENCOUNTER — Other Ambulatory Visit: Payer: Self-pay | Admitting: Family

## 2020-11-20 ENCOUNTER — Encounter: Payer: Self-pay | Admitting: Family

## 2020-11-20 VITALS — BP 127/74 | HR 65 | Temp 97.2°F | Ht 67.0 in | Wt 147.2 lb

## 2020-11-20 DIAGNOSIS — J449 Chronic obstructive pulmonary disease, unspecified: Secondary | ICD-10-CM

## 2020-11-20 DIAGNOSIS — N401 Enlarged prostate with lower urinary tract symptoms: Secondary | ICD-10-CM

## 2020-11-20 DIAGNOSIS — F330 Major depressive disorder, recurrent, mild: Secondary | ICD-10-CM | POA: Diagnosis not present

## 2020-11-20 DIAGNOSIS — E785 Hyperlipidemia, unspecified: Secondary | ICD-10-CM

## 2020-11-20 DIAGNOSIS — J61 Pneumoconiosis due to asbestos and other mineral fibers: Secondary | ICD-10-CM

## 2020-11-20 DIAGNOSIS — F331 Major depressive disorder, recurrent, moderate: Secondary | ICD-10-CM

## 2020-11-20 DIAGNOSIS — I10 Essential (primary) hypertension: Secondary | ICD-10-CM | POA: Diagnosis not present

## 2020-11-20 DIAGNOSIS — K2100 Gastro-esophageal reflux disease with esophagitis, without bleeding: Secondary | ICD-10-CM

## 2020-11-20 DIAGNOSIS — Z79899 Other long term (current) drug therapy: Secondary | ICD-10-CM | POA: Diagnosis not present

## 2020-11-20 DIAGNOSIS — Z23 Encounter for immunization: Secondary | ICD-10-CM

## 2020-11-20 DIAGNOSIS — G4700 Insomnia, unspecified: Secondary | ICD-10-CM

## 2020-11-20 MED ORDER — ZOLPIDEM TARTRATE 10 MG PO TABS: 10.0000 mg | ORAL_TABLET | Freq: Every evening | ORAL | 5 refills | Status: AC | PRN

## 2020-11-20 NOTE — Progress Notes (Signed)
Subjective:    Patient ID: Blake Kinds., male    DOB: 1945/03/02, 75 y.o.   MRN: [id number]  Chief Complaint  Patient presents with   Medical Management of Chronic Issues   PT presents to the office today for chronic follow up. He is followed by Pulmonologist  annually for asbestosis and pulmonary nodule. He gets a yearly CT scan.   He reports he walks every morning for at least a mile.  Hypertension This is a chronic problem. The current episode started more than 1 year ago. The problem has been resolved since onset. The problem is controlled. Pertinent negatives include no malaise/fatigue or peripheral edema. Risk factors for coronary artery disease include dyslipidemia and male gender. The current treatment provides moderate improvement.  Asthma There is no cough or wheezing. This is a chronic problem. The problem occurs intermittently. Associated symptoms include heartburn. Pertinent negatives include no malaise/fatigue or rhinorrhea. He reports moderate improvement on treatment. His past medical history is significant for asthma.  Gastroesophageal Reflux He complains of belching and heartburn. He reports no coughing or no wheezing. This is a chronic problem. The current episode started in the past 7 days. The problem occurs occasionally. He has tried a PPI for the symptoms. The treatment provided moderate relief.  Benign Prostatic Hypertrophy This is a chronic problem. The current episode started more than 1 year ago. Irritative symptoms do not include nocturia.  Hyperlipidemia This is a chronic problem. The current episode started more than 1 year ago. Current antihyperlipidemic treatment includes statins. The current treatment provides moderate improvement of lipids. Risk factors for coronary artery disease include dyslipidemia and male sex.  Depression        This is a chronic problem.  The current episode started more than 1 year ago.   The problem occurs intermittently.   Associated symptoms include insomnia.  Associated symptoms include no helplessness, no hopelessness, not irritable and not sad. Insomnia Primary symptoms: difficulty falling asleep, frequent awakening, no malaise/fatigue.   The current episode started more than one year. The problem occurs intermittently. PMH includes: depression.      Review of Systems  Constitutional:  Negative for malaise/fatigue.  HENT:  Negative for rhinorrhea.   Respiratory:  Negative for cough and wheezing.   Gastrointestinal:  Positive for heartburn.  Genitourinary:  Negative for nocturia.  Psychiatric/Behavioral:  Positive for depression. The patient has insomnia.   All other systems reviewed and are negative.     Objective:   Physical Exam Vitals reviewed.  Constitutional:      General: He is not irritable.He is not in acute distress.    Appearance: He is well-developed.  HENT:     Head: Normocephalic.     Right Ear: Tympanic membrane normal.     Left Ear: Tympanic membrane normal.  Eyes:     General:        Right eye: No discharge.        Left eye: No discharge.     Pupils: Pupils are equal, round, and reactive to light.  Neck:     Thyroid: No thyromegaly.  Cardiovascular:     Rate and Rhythm: Normal rate and regular rhythm.     Heart sounds: Normal heart sounds. No murmur heard. Pulmonary:     Effort: Pulmonary effort is normal. No respiratory distress.     Breath sounds: Normal breath sounds. No wheezing.  Abdominal:     General: Bowel sounds are normal. There is no distension.  Palpations: Abdomen is soft.     Tenderness: There is no abdominal tenderness.  Musculoskeletal:        General: No tenderness. Normal range of motion.     Cervical back: Normal range of motion and neck supple.  Skin:    General: Skin is warm and dry.     Findings: No erythema or rash.  Neurological:     Mental Status: He is alert and oriented to person, place, and time.     Cranial Nerves: No cranial nerve  deficit.     Deep Tendon Reflexes: Reflexes are normal and symmetric.  Psychiatric:        Behavior: Behavior normal.        Thought Content: Thought content normal.        Judgment: Judgment normal.         BP 127/74   Pulse 65   Temp (!) 97.2 F (36.2 C) (Temporal)   Ht 5' 7"  (1.702 m)   Wt 147 lb 3.2 oz (66.8 kg)   BMI 23.05 kg/m   Assessment & Plan:  Blake Kinds. comes in today with chief complaint of Medical Management of Chronic Issues   Diagnosis and orders addressed:  1. Primary hypertension - CMP14+EGFR  2. Asthma-COPD overlap syndrome (HCC) - CMP14+EGFR  3. Asbestosis ([place]) - CMP14+EGFR  4. Gastroesophageal reflux disease with esophagitis without hemorrhage - CMP14+EGFR  5. Hyperlipidemia, unspecified hyperlipidemia type - CMP14+EGFR  6. Moderate episode of recurrent major depressive disorder (HCC) - CMP14+EGFR  7. Major depressive disorder, recurrent episode, mild (HCC) - CMP14+EGFR  8. Controlled substance agreement signed  - zolpidem (AMBIEN) 10 MG tablet; Take 1 tablet (10 mg total) by mouth at bedtime as needed for sleep.  Dispense: 30 tablet; Refill: 5 - CMP14+EGFR - ToxASSURE Select 13 (MW), Urine  9. Insomnia, unspecified type - zolpidem (AMBIEN) 10 MG tablet; Take 1 tablet (10 mg total) by mouth at bedtime as needed for sleep.  Dispense: 30 tablet; Refill: 5 - CMP14+EGFR - ToxASSURE Select 13 (MW), Urine  10. Benign prostatic hyperplasia with lower urinary tract symptoms, symptom details unspecified  - CMP14+EGFR   Labs pending Patient reviewed in  controlled database, no flags noted. Contract and drug screen are up to date.  Health Maintenance reviewed Diet and exercise encouraged  Follow up plan: 6 months    Evelina Dun, FNP

## 2020-11-20 NOTE — Patient Instructions (Signed)
Insomnia Insomnia is a sleep disorder that makes it difficult to fall asleep or stay asleep. Insomnia can cause fatigue, low energy, difficulty concentrating, moodswings, and poor performance at work or school. There are three different ways to classify insomnia: Difficulty falling asleep. Difficulty staying asleep. Waking up too early in the morning. Any type of insomnia can be long-term (chronic) or short-term (acute). Both are common. Short-term insomnia usually lasts for three months or less. Chronic insomnia occurs at least three times a week for longer than threemonths. What are the causes? Insomnia may be caused by another condition, situation, or substance, such as: Anxiety. Certain medicines. Gastroesophageal reflux disease (GERD) or other gastrointestinal conditions. Asthma or other breathing conditions. Restless legs syndrome, sleep apnea, or other sleep disorders. Chronic pain. Menopause. Stroke. Abuse of alcohol, tobacco, or illegal drugs. Mental health conditions, such as depression. Caffeine. Neurological disorders, such as Alzheimer's disease. An overactive thyroid (hyperthyroidism). Sometimes, the cause of insomnia may not be known. What increases the risk? Risk factors for insomnia include: Gender. Women are affected more often than men. Age. Insomnia is more common as you get older. Stress. Lack of exercise. Irregular work schedule or working night shifts. Traveling between different time zones. Certain medical and mental health conditions. What are the signs or symptoms? If you have insomnia, the main symptom is having trouble falling asleep or having trouble staying asleep. This may lead to other symptoms, such as: Feeling fatigued or having low energy. Feeling nervous about going to sleep. Not feeling rested in the morning. Having trouble concentrating. Feeling irritable, anxious, or depressed. How is this diagnosed? This condition may be diagnosed based  on: Your symptoms and medical history. Your health care provider may ask about: Your sleep habits. Any medical conditions you have. Your mental health. A physical exam. How is this treated? Treatment for insomnia depends on the cause. Treatment may focus on treating an underlying condition that is causing insomnia. Treatment may also include: Medicines to help you sleep. Counseling or therapy. Lifestyle adjustments to help you sleep better. Follow these instructions at home: Eating and drinking  Limit or avoid alcohol, caffeinated beverages, and cigarettes, especially close to bedtime. These can disrupt your sleep. Do not eat a large meal or eat spicy foods right before bedtime. This can lead to digestive discomfort that can make it hard for you to sleep.  Sleep habits  Keep a sleep diary to help you and your health care provider figure out what could be causing your insomnia. Write down: When you sleep. When you wake up during the night. How well you sleep. How rested you feel the next day. Any side effects of medicines you are taking. What you eat and drink. Make your bedroom a dark, comfortable place where it is easy to fall asleep. Put up shades or blackout curtains to block light from outside. Use a white noise machine to block noise. Keep the temperature cool. Limit screen use before bedtime. This includes: Watching TV. Using your smartphone, tablet, or computer. Stick to a routine that includes going to bed and waking up at the same times every day and night. This can help you fall asleep faster. Consider making a quiet activity, such as reading, part of your nighttime routine. Try to avoid taking naps during the day so that you sleep better at night. Get out of bed if you are still awake after 15 minutes of trying to sleep. Keep the lights down, but try reading or doing a quiet   activity. When you feel sleepy, go back to bed.  General instructions Take over-the-counter  and prescription medicines only as told by your health care provider. Exercise regularly, as told by your health care provider. Avoid exercise starting several hours before bedtime. Use relaxation techniques to manage stress. Ask your health care provider to suggest some techniques that may work well for you. These may include: Breathing exercises. Routines to release muscle tension. Visualizing peaceful scenes. Make sure that you drive carefully. Avoid driving if you feel very sleepy. Keep all follow-up visits as told by your health care provider. This is important. Contact a health care provider if: You are tired throughout the day. You have trouble in your daily routine due to sleepiness. You continue to have sleep problems, or your sleep problems get worse. Get help right away if: You have serious thoughts about hurting yourself or someone else. If you ever feel like you may hurt yourself or others, or have thoughts about taking your own life, get help right away. You can go to your nearest emergency department or call: Your local emergency services (911 in the [place] A suicide crisis helpline, such as the National Suicide Prevention Lifeline at 1-800-273-8255. This is open 24 hours a day. Summary Insomnia is a sleep disorder that makes it difficult to fall asleep or stay asleep. Insomnia can be long-term (chronic) or short-term (acute). Treatment for insomnia depends on the cause. Treatment may focus on treating an underlying condition that is causing insomnia. Keep a sleep diary to help you and your health care provider figure out what could be causing your insomnia. This information is not intended to replace advice given to you by your health care provider. Make sure you discuss any questions you have with your healthcare provider. Document Revised: 11/25/2019 Document Reviewed: 11/25/2019 Elsevier Patient Education  2022 [employer].  

## 2020-11-24 ENCOUNTER — Other Ambulatory Visit: Payer: Self-pay | Admitting: Family

## 2020-11-24 DIAGNOSIS — G4700 Insomnia, unspecified: Secondary | ICD-10-CM

## 2020-11-24 DIAGNOSIS — Z79899 Other long term (current) drug therapy: Secondary | ICD-10-CM

## 2020-11-25 LAB — TOXASSURE SELECT 13 (MW), URINE

## 2020-11-28 ENCOUNTER — Telehealth: Payer: Self-pay | Admitting: Family

## 2020-11-28 NOTE — Telephone Encounter (Addendum)
Call to [facility] medicine is ready for him to pick up, pt aware Christy sent it in on 11/20/20 #30 with 5 refills

## 2020-11-28 NOTE — Telephone Encounter (Signed)
  Prescription Request  11/28/2020  Is this a "Controlled Substance" medicine? yes  Have you seen your PCP in the last 2 weeks? yes  If YES, route message to pool  -  If NO, patient needs to be scheduled for appointment.  What is the name of the medication or equipment? Zolpidem 10 mg Was seen 10-24 with Christy for Ambien refills  Have you contacted your pharmacy to request a refill? yes   Which pharmacy would you like this sent to[facility] [place]   Patient notified that their request is being sent to the clinical staff for review and that they should receive a response within 2 business days.

## 2020-12-04 DIAGNOSIS — I10 Essential (primary) hypertension: Secondary | ICD-10-CM | POA: Diagnosis not present

## 2020-12-04 DIAGNOSIS — J449 Chronic obstructive pulmonary disease, unspecified: Secondary | ICD-10-CM | POA: Diagnosis not present

## 2020-12-04 DIAGNOSIS — R351 Nocturia: Secondary | ICD-10-CM | POA: Diagnosis not present

## 2020-12-04 DIAGNOSIS — Z77090 Contact with and (suspected) exposure to asbestos: Secondary | ICD-10-CM | POA: Diagnosis not present

## 2020-12-04 DIAGNOSIS — E785 Hyperlipidemia, unspecified: Secondary | ICD-10-CM | POA: Diagnosis not present

## 2020-12-04 DIAGNOSIS — H4912 Fourth [trochlear] nerve palsy, left eye: Secondary | ICD-10-CM | POA: Diagnosis not present

## 2020-12-04 DIAGNOSIS — N401 Enlarged prostate with lower urinary tract symptoms: Secondary | ICD-10-CM | POA: Diagnosis not present

## 2020-12-23 ENCOUNTER — Other Ambulatory Visit: Payer: Self-pay | Admitting: Family

## 2020-12-23 DIAGNOSIS — E785 Hyperlipidemia, unspecified: Secondary | ICD-10-CM

## 2020-12-25 NOTE — Telephone Encounter (Signed)
Pharmacy requesting Atorvastatin 20 mg, which is not on med list Atorvastatin 40 mg is historical med. Last OV 11/20/20 Next OV not sched

## 2021-01-03 DIAGNOSIS — H2513 Age-related nuclear cataract, bilateral: Secondary | ICD-10-CM | POA: Diagnosis not present

## 2021-01-03 DIAGNOSIS — I10 Essential (primary) hypertension: Secondary | ICD-10-CM | POA: Diagnosis not present

## 2021-01-03 DIAGNOSIS — H532 Diplopia: Secondary | ICD-10-CM | POA: Diagnosis not present

## 2021-01-03 DIAGNOSIS — H179 Unspecified corneal scar and opacity: Secondary | ICD-10-CM | POA: Diagnosis not present

## 2021-01-03 DIAGNOSIS — H4912 Fourth [trochlear] nerve palsy, left eye: Secondary | ICD-10-CM | POA: Diagnosis not present

## 2021-03-13 DIAGNOSIS — Z85828 Personal history of other malignant neoplasm of skin: Secondary | ICD-10-CM | POA: Diagnosis not present

## 2021-03-13 DIAGNOSIS — D235 Other benign neoplasm of skin of trunk: Secondary | ICD-10-CM | POA: Diagnosis not present

## 2021-03-13 DIAGNOSIS — L821 Other seborrheic keratosis: Secondary | ICD-10-CM | POA: Diagnosis not present

## 2021-03-13 DIAGNOSIS — L579 Skin changes due to chronic exposure to nonionizing radiation, unspecified: Secondary | ICD-10-CM | POA: Diagnosis not present

## 2021-03-13 DIAGNOSIS — L814 Other melanin hyperpigmentation: Secondary | ICD-10-CM | POA: Diagnosis not present

## 2021-03-28 DIAGNOSIS — J449 Chronic obstructive pulmonary disease, unspecified: Secondary | ICD-10-CM | POA: Diagnosis not present

## 2021-03-28 DIAGNOSIS — M79605 Pain in left leg: Secondary | ICD-10-CM | POA: Diagnosis not present

## 2021-03-28 DIAGNOSIS — M79604 Pain in right leg: Secondary | ICD-10-CM | POA: Diagnosis not present

## 2021-03-28 DIAGNOSIS — G5601 Carpal tunnel syndrome, right upper limb: Secondary | ICD-10-CM | POA: Diagnosis not present

## 2021-03-28 DIAGNOSIS — M545 Low back pain, unspecified: Secondary | ICD-10-CM | POA: Diagnosis not present

## 2021-04-02 ENCOUNTER — Ambulatory Visit: Payer: Medicare Other | Attending: Internal Medicine | Admitting: Physical Therapy

## 2021-04-02 ENCOUNTER — Encounter: Payer: Self-pay | Admitting: Physical Therapy

## 2021-04-02 ENCOUNTER — Other Ambulatory Visit: Payer: Self-pay

## 2021-04-02 DIAGNOSIS — R293 Abnormal posture: Secondary | ICD-10-CM | POA: Diagnosis not present

## 2021-04-02 DIAGNOSIS — M5442 Lumbago with sciatica, left side: Secondary | ICD-10-CM | POA: Insufficient documentation

## 2021-04-02 NOTE — Therapy (Addendum)
[facility] [place], [place], [address] Phone: [phone]   Fax:  [phone]  Physical Therapy Evaluation  Patient Details  Name: Blake Madden. MRN: [id number] Date of Birth: 01-Oct-1945 Referring Provider (PT): Wende Crease MD   Encounter Date: 04/02/2021   PT End of Session - 04/02/21 1111     Visit Number 1    Number of Visits 12    Date for PT Re-Evaluation 07/01/21    Authorization Type FOTO AT LEAST EVERY 5TH VISIT.  PROGRESS NOTE AT 10TH VISIT.  KX MODIFIER AFTER 15 VISITS.    PT Start Time 1028    PT Stop Time 1109    PT Time Calculation (min) 41 min    Activity Tolerance Patient tolerated treatment well    Behavior During Therapy WFL for tasks assessed/performed             Past Medical History:  Diagnosis Date   Allergic rhinitis    Allergy    Asthma    BPH (benign prostatic hypertrophy)    Depression    GERD (gastroesophageal reflux disease)    HTN (hypertension)    Hyperlipemia    Migraines     Past Surgical History:  Procedure Laterality Date   INGUINAL HERNIA REPAIR Bilateral 02/07/2016   Procedure: LAPAROSCOPIC BILATERAL INGUINAL HERNIA REPAIR WITH UMBILICIAL HERNIA REPAIR;  Surgeon: Coralie Keens, MD;  Location: [facility];  Service: General;  Laterality: Bilateral;   INSERTION OF MESH Bilateral 02/07/2016   Procedure: INSERTION OF MESH TO INGUINAL HERNIAS;  Surgeon: Coralie Keens, MD;  Location: [facility];  Service: General;  Laterality: Bilateral;   KNEE ARTHROSCOPY Left     There were no vitals filed for this visit.    Subjective Assessment - 04/02/21 1057     Subjective COVID-19 screen performed prior to patient entering clinic.  The patient presents to the clinic today with c/o left-sided low back pain with radiation into his left posterior thigh.  He states this has been going on for about 6 weeks and came on for no apparent reason.  Sitting decreases his pain and is low at rest  today. His pain begins rising to higher levels within 5 minutes of standing going to about a 6 to 6+/10.    Pertinent History HTN, bilateral TKA's, hernia repair.    How long can you sit comfortably? Unlimited    How long can you stand comfortably? ~5 minutes.    How long can you walk comfortably? Pain not greatly increased with walking.    Patient Stated Goals Get out of pain.  Golf.    Currently in Pain? Yes    Pain Score 6     Pain Location Back    Pain Orientation Left    Pain Descriptors / Indicators Aching    Pain Type Acute pain    Pain Radiating Towards Left posterior thigh.    Pain Onset More than a month ago    Pain Frequency Intermittent    Aggravating Factors  Standing.    Pain Relieving Factors Sitting.                [facility] PT Assessment - 04/02/21 0001       Assessment   Medical Diagnosis Lumbar pain with radiation down legs.    Referring Provider (PT) Wende Crease MD    Onset Date/Surgical Date --   ~6 weeks.     Precautions   Precautions None  Restrictions   Weight Bearing Restrictions No      Balance Screen   Has the patient fallen in the past 6 months No    Has the patient had a decrease in activity level because of a fear of falling?  No    Is the patient reluctant to leave their home because of a fear of falling?  No      Home Environment   Living Environment Private residence      Prior Function   Level of Independence Independent      Observation/Other Assessments   Focus on Therapeutic Outcomes (FOTO)  Complete.      Posture/Postural Control   Posture/Postural Control Postural limitations    Postural Limitations Rounded Shoulders;Forward head;Decreased lumbar lordosis      Deep Tendon Reflexes   DTR Assessment Site Patella;Achilles    Patella DTR 0    Achilles DTR 0      ROM / Strength   AROM / PROM / Strength AROM;Strength      AROM   Overall AROM Comments Active lumbar flexion limited by 50% and extension is 15 degrees.       Strength   Overall Strength Comments Normal LE strength.      Palpation   Palpation comment Very tender to palpation over patient's left low back musculature and especially his QL which is significant for a great deal of tone.      Special Tests   Other special tests Equal leg lengths.  (-) SLR testing.      Ambulation/Gait   Gait Comments WNL.                        Objective measurements completed on examination: See above findings.       [facility] Adult PT Treatment/Exercise - 04/02/21 0001       Modalities   Modalities Electrical Stimulation      Electrical Stimulation   Electrical Stimulation Location Left low back.    Electrical Stimulation Action IFC at 80-150 Hz.    Electrical Stimulation Parameters 40% scan x 15 minutes.  Normal modality reponse.    Electrical Stimulation Goals Pain;Tone                          PT Long Term Goals - 04/02/21 1204       PT LONG TERM GOAL #1   Title Patient will be independent with advanced HEP    Time 6    Period Weeks    Status New      PT LONG TERM GOAL #2   Title Stand 20 minutes with LBP not > 2-3/10.    Time 6    Period Weeks    Status New      PT LONG TERM GOAL #3   Title Eliminate radiation of pain into left LE.    Time 6    Period Weeks    Status New      PT LONG TERM GOAL #4   Title Return to [employer]                    Plan - 04/02/21 1151     Clinical Impression Statement The patient presents to OPPT with c/o left-sided low back with radiation into his left posterior thigh to about the level of his knee with increased standing and activities. He states his pain came on about 6 weeks ago for  no apparent reason.  He lacks some active spinal flexion and extension at this time.  His LE strength is normal and special testing is negative.  His most notable finding was a great deal of tenderness and tone over his left lower lumbar musculature, especially his left QL.   Patient will benefit from skilled physical therapy intervention to address pain and deficits.    Personal Factors and Comorbidities Comorbidity 1;Comorbidity 2;Other    Comorbidities HTN, bilateral TKA's, hernia repair.    Examination-Activity Limitations Other;Stand    Examination-Participation Restrictions Other;Yard Work    Stability/Clinical Decision Making Stable/Uncomplicated    Designer, jewellery Low    Rehab Potential Excellent    PT Frequency 2x / week    PT Duration 6 weeks    PT Treatment/Interventions ADLs/Self Care Home Management;Cryotherapy;Electrical Stimulation;Ultrasound;Traction;Moist Heat;Therapeutic activities;Functional mobility training;Therapeutic exercise;Manual techniques;Patient/family education;Passive range of motion;Dry needling;Spinal Manipulations    PT Next Visit Plan Combo e'stim/US, STW/M including left QL release technique, core exercise progression.    Consulted and Agree with Plan of Care Patient             Patient will benefit from skilled therapeutic intervention in order to improve the following deficits and impairments:  Decreased activity tolerance, Postural dysfunction, Pain, Increased muscle spasms, Decreased range of motion  Visit Diagnosis: Acute left-sided low back pain with left-sided sciatica - Plan: PT plan of care cert/re-cert  Abnormal posture - Plan: PT plan of care cert/re-cert     Problem List Patient Active Problem List   Diagnosis Date Noted   Allergic rhinitis 11/16/2020   Benign neoplasm of colon 11/16/2020   Asbestosis ([place]) 11/16/2020   Benign neoplasm of lip 11/16/2020   Brachial neuritis 11/16/2020   Disorder of skin and subcutaneous tissue 11/16/2020   Major depressive disorder, recurrent episode, mild ([place]) 11/16/2020   Other bursitis disorders 11/16/2020   Stricture and stenosis of esophagus 11/16/2020   Asthma-COPD overlap syndrome ([place]) 07/19/2019   Controlled substance agreement signed 11/03/2018    Insomnia 04/23/2018   Benign prostatic hyperplasia    HTN (hypertension)    Hyperlipemia    Asbestos exposure 05/06/2014   Asthma, chronic obstructive, with acute exacerbation ([place]) 07/17/2012   GERD (gastroesophageal reflux disease) 05/08/2012   Depression 05/08/2012    Damarys Speir, [place], PT 04/02/2021, 2:11 PM  [facility] [address] [place], [place], [address] Phone: [phone]   Fax:  [phone]  Name: Blake Madden. MRN: [id number] Date of Birth: 1945/10/03

## 2021-04-05 ENCOUNTER — Other Ambulatory Visit: Payer: Self-pay

## 2021-04-05 ENCOUNTER — Ambulatory Visit: Payer: Medicare Other | Admitting: *Deleted

## 2021-04-05 DIAGNOSIS — M5442 Lumbago with sciatica, left side: Secondary | ICD-10-CM

## 2021-04-05 DIAGNOSIS — R293 Abnormal posture: Secondary | ICD-10-CM

## 2021-04-05 NOTE — Therapy (Signed)
[place] ?[facility] ?[address] ?[place], [place], [address] ?Phone: [phone]   Fax:  [phone] ? ?Physical Therapy Treatment ? ?Patient Details  ?Name: Blake Madden. ?MRN: [id number] ?Date of Birth: 10-04-1945 ?Referring Provider (PT): Wende Crease MD ? ? ?Encounter Date: 04/05/2021 ? ? PT End of Session - 04/05/21 1436   ? ? Visit Number 2   ? Number of Visits 12   ? Date for PT Re-Evaluation 07/01/21   ? Authorization Type FOTO AT LEAST EVERY 5TH VISIT.  PROGRESS NOTE AT 10TH VISIT.  KX MODIFIER AFTER 15 VISITS.   ? PT Start Time 1430   ? PT Stop Time 1520   ? PT Time Calculation (min) 50 min   ? ?  ?  ? ?  ? ? ?Past Medical History:  ?Diagnosis Date  ? Allergic rhinitis   ? Allergy   ? Asthma   ? BPH (benign prostatic hypertrophy)   ? Depression   ? GERD (gastroesophageal reflux disease)   ? HTN (hypertension)   ? Hyperlipemia   ? Migraines   ? ? ?Past Surgical History:  ?Procedure Laterality Date  ? INGUINAL HERNIA REPAIR Bilateral 02/07/2016  ? Procedure: LAPAROSCOPIC BILATERAL INGUINAL HERNIA REPAIR WITH UMBILICIAL HERNIA REPAIR;  Surgeon: Coralie Keens, MD;  Location: [facility];  Service: General;  Laterality: Bilateral;  ? INSERTION OF MESH Bilateral 02/07/2016  ? Procedure: INSERTION OF MESH TO INGUINAL HERNIAS;  Surgeon: Coralie Keens, MD;  Location: [facility];  Service: General;  Laterality: Bilateral;  ? KNEE ARTHROSCOPY Left   ? ? ?There were no vitals filed for this visit. ? ? Subjective Assessment - 04/05/21 1434   ? ? Subjective COVID-19 screen performed prior to patient entering clinic. Taking it easy some. 5/10 today. Walking isn't that bad   ? Pertinent History HTN, bilateral TKA's, hernia repair.   ? How long can you sit comfortably? Unlimited   ? How long can you stand comfortably? ~5 minutes.   ? How long can you walk comfortably? Pain not greatly increased with walking.   ? Patient Stated Goals Get out of pain.  Golf.   ? Currently in Pain? Yes   ?  Pain Score 5    ? Pain Location Back   ? Pain Orientation Left   ? Pain Descriptors / Indicators Aching   ? Pain Type Acute pain   ? Pain Onset More than a month ago   ? ?  ?  ? ?  ? ? ? ? ? ? ? ? ? ? ? ? ? ? ? ? ? ? ? ? [facility] Adult PT Treatment/Exercise - 04/05/21 0001   ? ?  ? Therapeutic Activites   ? Therapeutic Activities ADL's   ? ADL's Discussed postures with ADL's and a walking program 2-3x daily   ?  ? Exercises  ? Exercises Lumbar;Knee/Hip   ?  ? Lumbar Exercises: Aerobic  ? Nustep X 10 mins L3 with focus on posture   ?  ? Lumbar Exercises: Quadruped  ? [place]/Old Horse 10 reps   ?  ? Modalities  ? Modalities Electrical Stimulation;Ultrasound   ?  ? Electrical Stimulation  ? Electrical Stimulation Location Left low back.   ? Electrical Stimulation Action IFC x 15 mins 80-'150hz'$    ? Electrical Stimulation Goals Pain;Tone   ?  ? Manual Therapy  ? Manual Therapy Soft tissue mobilization   ? Manual therapy comments STW/TPR to LT QL/ paraspinals in RT sidelying   ? ?  ?  ? ?  ? ? ? ? ? ? ? ? ? ? ? ? ? ? ?  PT Long Term Goals - 04/02/21 1204   ? ?  ? PT LONG TERM GOAL #1  ? Title Patient will be independent with advanced HEP   ? Time 6   ? Period Weeks   ? Status New   ?  ? PT LONG TERM GOAL #2  ? Title Stand 20 minutes with LBP not > 2-3/10.   ? Time 6   ? Period Weeks   ? Status New   ?  ? PT LONG TERM GOAL #3  ? Title Eliminate radiation of pain into left LE.   ? Time 6   ? Period Weeks   ? Status New   ?  ? PT LONG TERM GOAL #4  ? Title Return to [employer]   ? ?  ?  ? ?  ? ? ? ? ? ? ? ? Plan - 04/05/21 1436   ? ? Clinical Impression Statement Pt arrived today doing better with decreased tone/pain in LT QL and paras noted  during STW/ TPR. WE discussed ADL postures and pain triggers. Pt does  very well with walking and we  discussed a walking program 2-3 x daily. Did well with Rx and reports decreased pain end of session.. Cat and camel ex given for mobilty. Handout given for HEP   ? Personal Factors and  Comorbidities Comorbidity 1;Comorbidity 2;Other   ? Comorbidities HTN, bilateral TKA's, hernia repair.   ? Examination-Activity Limitations Other;Stand   ? Examination-Participation Restrictions Other;Valla Leaver Work   ? Stability/Clinical Decision Making Stable/Uncomplicated   ? Rehab Potential Excellent   ? PT Frequency 2x / week   ? PT Duration 6 weeks   ? PT Treatment/Interventions ADLs/Self Care Home Management;Cryotherapy;Electrical Stimulation;Ultrasound;Traction;Moist Heat;Therapeutic activities;Functional mobility training;Therapeutic exercise;Manual techniques;Patient/family education;Passive range of motion;Dry needling;Spinal Manipulations   ? PT Next Visit Plan Combo e'stim/US, STW/M including left QL release technique, core exercise progression.   ? Consulted and Agree with Plan of Care Patient   ? ?  ?  ? ?  ? ? ?Patient will benefit from skilled therapeutic intervention in order to improve the following deficits and impairments:  Decreased activity tolerance, Postural dysfunction, Pain, Increased muscle spasms, Decreased range of motion ? ?Visit Diagnosis: ?Acute left-sided low back pain with left-sided sciatica ? ?Abnormal posture ? ? ? ? ?Problem List ?Patient Active Problem List  ? Diagnosis Date Noted  ? Allergic rhinitis 11/16/2020  ? Benign neoplasm of colon 11/16/2020  ? Asbestosis ([place]) 11/16/2020  ? Benign neoplasm of lip 11/16/2020  ? Brachial neuritis 11/16/2020  ? Disorder of skin and subcutaneous tissue 11/16/2020  ? Major depressive disorder, recurrent episode, mild ([place]) 11/16/2020  ? Other bursitis disorders 11/16/2020  ? Stricture and stenosis of esophagus 11/16/2020  ? Asthma-COPD overlap syndrome ([place]) 07/19/2019  ? Controlled substance agreement signed 11/03/2018  ? Insomnia 04/23/2018  ? Benign prostatic hyperplasia   ? HTN (hypertension)   ? Hyperlipemia   ? Asbestos exposure 05/06/2014  ? Asthma, chronic obstructive, with acute exacerbation ([place]) 07/17/2012  ? GERD (gastroesophageal  reflux disease) 05/08/2012  ? Depression 05/08/2012  ? ? ?Laylanie Kruczek,CHRIS, PTA ?04/05/2021, 3:32 PM ? ?[place] ?[facility] ?[address] ?[place], [place], [address] ?Phone: [phone]   Fax:  [phone] ? ?Name: Blake Madden. ?MRN: [id number] ?Date of Birth: [birth year] ? ? ? ?

## 2021-04-09 ENCOUNTER — Encounter: Payer: Self-pay | Admitting: Physical Therapy

## 2021-04-09 ENCOUNTER — Ambulatory Visit: Payer: Medicare Other | Admitting: Physical Therapy

## 2021-04-09 ENCOUNTER — Other Ambulatory Visit: Payer: Self-pay

## 2021-04-09 DIAGNOSIS — M5442 Lumbago with sciatica, left side: Secondary | ICD-10-CM

## 2021-04-09 DIAGNOSIS — R293 Abnormal posture: Secondary | ICD-10-CM

## 2021-04-09 NOTE — Therapy (Signed)
[place] ?[facility] ?[address] ?[place], [place], [address] ?Phone: [phone]   Fax:  [phone] ? ?Physical Therapy Treatment ? ?Patient Details  ?Name: Blake Madden. ?MRN: [id number] ?Date of Birth: [birth year] ?Referring Provider (PT): Wende Crease MD ? ? ?Encounter Date: 04/09/2021 ? ? PT End of Session - 04/09/21 0851   ? ? Visit Number 3   ? Number of Visits 12   ? Date for PT Re-Evaluation 07/01/21   ? Authorization Type FOTO AT LEAST EVERY 5TH VISIT.  PROGRESS NOTE AT 10TH VISIT.  KX MODIFIER AFTER 15 VISITS.   ? PT Start Time 0900   ? PT Stop Time [phone]   ? PT Time Calculation (min) 48 min   ? Activity Tolerance Patient tolerated treatment well   ? Behavior During Therapy [facility] for tasks assessed/performed   ? ?  ?  ? ?  ? ? ?Past Medical History:  ?Diagnosis Date  ? Allergic rhinitis   ? Allergy   ? Asthma   ? BPH (benign prostatic hypertrophy)   ? Depression   ? GERD (gastroesophageal reflux disease)   ? HTN (hypertension)   ? Hyperlipemia   ? Migraines   ? ? ?Past Surgical History:  ?Procedure Laterality Date  ? INGUINAL HERNIA REPAIR Bilateral 02/07/2016  ? Procedure: LAPAROSCOPIC BILATERAL INGUINAL HERNIA REPAIR WITH UMBILICIAL HERNIA REPAIR;  Surgeon: Coralie Keens, MD;  Location: [facility];  Service: General;  Laterality: Bilateral;  ? INSERTION OF MESH Bilateral 02/07/2016  ? Procedure: INSERTION OF MESH TO INGUINAL HERNIAS;  Surgeon: Coralie Keens, MD;  Location: [facility];  Service: General;  Laterality: Bilateral;  ? KNEE ARTHROSCOPY Left   ? ? ?There were no vitals filed for this visit. ? ? Subjective Assessment - 04/09/21 0850   ? ? Subjective COVID-19 screen performed prior to patient entering clinic. States that his back is a little better.   ? Pertinent History HTN, bilateral TKA's, hernia repair.   ? How long can you sit comfortably? Unlimited   ? How long can you stand comfortably? ~5 minutes.   ? How long can you walk comfortably? Pain not greatly  increased with walking.   ? Patient Stated Goals Get out of pain.  Golf.   ? Currently in Pain? No/denies   ? ?  ?  ? ?  ? ? ? ? ? [facility] PT Assessment - 04/09/21 0001   ? ?  ? Assessment  ? Medical Diagnosis Lumbar pain with radiation down legs.   ? Referring Provider (PT) Wende Crease MD   ? Next MD Visit None   ?  ? Precautions  ? Precautions None   ? ?  ?  ? ?  ? ? ? ? ? ? ? ? ? ? ? ? ? ? ? ? [facility] Adult PT Treatment/Exercise - 04/09/21 0001   ? ?  ? Lumbar Exercises: Aerobic  ? Nustep L3 x17 min   ?  ? Modalities  ? Modalities Electrical Stimulation   ?  ? Electrical Stimulation  ? Electrical Stimulation Location Left low back.   ? Electrical Stimulation Action Pre-Mod   ? Electrical Stimulation Parameters 80-150 hz x15 min   ? Electrical Stimulation Goals Tone   ?  ? Manual Therapy  ? Manual Therapy Soft tissue mobilization   ? Soft tissue mobilization STW/TPR to L QL, lumbar paraspinals to reduce tone   ? ?  ?  ? ?  ? ? ? ? ? ? ? ? ? ? ? ? ? ? ?  PT Long Term Goals - 04/02/21 1204   ? ?  ? PT LONG TERM GOAL #1  ? Title Patient will be independent with advanced HEP   ? Time 6   ? Period Weeks   ? Status New   ?  ? PT LONG TERM GOAL #2  ? Title Stand 20 minutes with LBP not > 2-3/10.   ? Time 6   ? Period Weeks   ? Status New   ?  ? PT LONG TERM GOAL #3  ? Title Eliminate radiation of pain into left LE.   ? Time 6   ? Period Weeks   ? Status New   ?  ? PT LONG TERM GOAL #4  ? Title Return to [employer]   ? ?  ?  ? ?  ? ? ? ? ? ? ? ? Plan - 04/09/21 0936   ? ? Clinical Impression Statement Patient presented in clinic with reports of no current LBP. Patient is moving but states that the last few days he has been taking it easy. Patient reports that he can tell he is improving as his low back is not palpably tender anymore. Moderately increased tone of L QL notable with some inflammation as well. Normal stimulation response noted following removal of the modality.   ? Personal Factors and Comorbidities Comorbidity  1;Comorbidity 2;Other   ? Comorbidities HTN, bilateral TKA's, hernia repair.   ? Examination-Activity Limitations Other;Stand   ? Examination-Participation Restrictions Other;Valla Leaver Work   ? Stability/Clinical Decision Making Stable/Uncomplicated   ? Rehab Potential Excellent   ? PT Frequency 2x / week   ? PT Duration 6 weeks   ? PT Treatment/Interventions ADLs/Self Care Home Management;Cryotherapy;Electrical Stimulation;Ultrasound;Traction;Moist Heat;Therapeutic activities;Functional mobility training;Therapeutic exercise;Manual techniques;Patient/family education;Passive range of motion;Dry needling;Spinal Manipulations   ? PT Next Visit Plan Combo e'stim/US, STW/M including left QL release technique, core exercise progression.   ? Consulted and Agree with Plan of Care Patient   ? ?  ?  ? ?  ? ? ?Patient will benefit from skilled therapeutic intervention in order to improve the following deficits and impairments:  Decreased activity tolerance, Postural dysfunction, Pain, Increased muscle spasms, Decreased range of motion ? ?Visit Diagnosis: ?Acute left-sided low back pain with left-sided sciatica ? ?Abnormal posture ? ? ? ? ?Problem List ?Patient Active Problem List  ? Diagnosis Date Noted  ? Allergic rhinitis 11/16/2020  ? Benign neoplasm of colon 11/16/2020  ? Asbestosis ([place]) 11/16/2020  ? Benign neoplasm of lip 11/16/2020  ? Brachial neuritis 11/16/2020  ? Disorder of skin and subcutaneous tissue 11/16/2020  ? Major depressive disorder, recurrent episode, mild ([place]) 11/16/2020  ? Other bursitis disorders 11/16/2020  ? Stricture and stenosis of esophagus 11/16/2020  ? Asthma-COPD overlap syndrome ([place]) 07/19/2019  ? Controlled substance agreement signed 11/03/2018  ? Insomnia 04/23/2018  ? Benign prostatic hyperplasia   ? HTN (hypertension)   ? Hyperlipemia   ? Asbestos exposure 05/06/2014  ? Asthma, chronic obstructive, with acute exacerbation ([place]) 07/17/2012  ? GERD (gastroesophageal reflux disease) 05/08/2012   ? Depression 05/08/2012  ? ? ?Standley Brooking, PTA ?04/09/2021, 9:50 AM ? ?[place] ?[facility] ?[address] ?[place], [place], [address] ?Phone: [phone]   Fax:  [phone] ? ?Name: Blake Madden. ?MRN: [id number] ?Date of Birth: 10-Mar-1945 ? ? ? ?

## 2021-04-12 ENCOUNTER — Other Ambulatory Visit: Payer: Self-pay

## 2021-04-12 ENCOUNTER — Ambulatory Visit: Payer: Medicare Other

## 2021-04-12 DIAGNOSIS — M5442 Lumbago with sciatica, left side: Secondary | ICD-10-CM | POA: Diagnosis not present

## 2021-04-12 DIAGNOSIS — R293 Abnormal posture: Secondary | ICD-10-CM | POA: Diagnosis not present

## 2021-04-12 NOTE — Therapy (Signed)
[place] ?[facility] ?[address] ?[place], [place], [address] ?Phone: [phone]   Fax:  [phone] ? ?Physical Therapy Treatment ? ?Patient Details  ?Name: Blake Madden. ?MRN: [id number] ?Date of Birth: March 03, 1945 ?Referring Provider (PT): Wende Crease MD ? ? ?Encounter Date: 04/12/2021 ? ? PT End of Session - 04/12/21 0902   ? ? Visit Number 4   ? Number of Visits 12   ? Date for PT Re-Evaluation 07/01/21   ? Authorization Type FOTO AT LEAST EVERY 5TH VISIT.  PROGRESS NOTE AT 10TH VISIT.  KX MODIFIER AFTER 15 VISITS.   ? PT Start Time 0900   ? PT Stop Time 1002   ? PT Time Calculation (min) 62 min   ? Activity Tolerance Patient tolerated treatment well   ? Behavior During Therapy [facility] for tasks assessed/performed   ? ?  ?  ? ?  ? ? ?Past Medical History:  ?Diagnosis Date  ? Allergic rhinitis   ? Allergy   ? Asthma   ? BPH (benign prostatic hypertrophy)   ? Depression   ? GERD (gastroesophageal reflux disease)   ? HTN (hypertension)   ? Hyperlipemia   ? Migraines   ? ? ?Past Surgical History:  ?Procedure Laterality Date  ? INGUINAL HERNIA REPAIR Bilateral 02/07/2016  ? Procedure: LAPAROSCOPIC BILATERAL INGUINAL HERNIA REPAIR WITH UMBILICIAL HERNIA REPAIR;  Surgeon: Coralie Keens, MD;  Location: [facility];  Service: General;  Laterality: Bilateral;  ? INSERTION OF MESH Bilateral 02/07/2016  ? Procedure: INSERTION OF MESH TO INGUINAL HERNIAS;  Surgeon: Coralie Keens, MD;  Location: [facility];  Service: General;  Laterality: Bilateral;  ? KNEE ARTHROSCOPY Left   ? ? ?There were no vitals filed for this visit. ? ? Subjective Assessment - 04/12/21 0901   ? ? Subjective COVID-19 screen performed prior to patient entering clinic. Patient reports that his back is hurting a little this morning, but it is getting better.   ? Pertinent History HTN, bilateral TKA's, hernia repair.   ? How long can you sit comfortably? Unlimited   ? How long can you stand comfortably? ~5 minutes.   ? How  long can you walk comfortably? Pain not greatly increased with walking.   ? Patient Stated Goals Get out of pain.  Golf.   ? Currently in Pain? Yes   ? Pain Score 2    ? Pain Location Back   ? ?  ?  ? ?  ? ? ? ? ? ? ? ? ? ? ? ? ? ? ? ? ? ? ? ? [facility] Adult PT Treatment/Exercise - 04/12/21 0001   ? ?  ? Lumbar Exercises: Aerobic  ? Nustep L4 x 15 min   ?  ? Lumbar Exercises: Standing  ? Shoulder Extension Both;Other (comment)   2 minutes  ? Shoulder Extension Limitations Blue XTS   ? Other Standing Lumbar Exercises Self STM   with lacross ball  ? Other Standing Lumbar Exercises Hip hike   20 reps  ?  ? Lumbar Exercises: Seated  ? Other Seated Lumbar Exercises Flexion with right rotation   30 reps  ?  ? Knee/Hip Exercises: Standing  ? Hip Abduction Both;Knee straight   3 minutes; 1.5 with red t-band and 1.5 minutes with green t-band  ?  ? Modalities  ? Modalities Electrical Stimulation   ?  ? Electrical Stimulation  ? Electrical Stimulation Location Left low back.   ? Electrical Stimulation Action IFC   ?  Electrical Stimulation Parameters 80-150 Hz w/ 40% scan x 15 minutes   ? Electrical Stimulation Goals Tone   ?  ? Manual Therapy  ? Manual Therapy Soft tissue mobilization   ? Soft tissue mobilization to left QL and lumbar paraspinals   ? ?  ?  ? ?  ? ? ? ? ? ? ? ? ? ? ? ? ? ? ? PT Long Term Goals - 04/02/21 1204   ? ?  ? PT LONG TERM GOAL #1  ? Title Patient will be independent with advanced HEP   ? Time 6   ? Period Weeks   ? Status New   ?  ? PT LONG TERM GOAL #2  ? Title Stand 20 minutes with LBP not > 2-3/10.   ? Time 6   ? Period Weeks   ? Status New   ?  ? PT LONG TERM GOAL #3  ? Title Eliminate radiation of pain into left LE.   ? Time 6   ? Period Weeks   ? Status New   ?  ? PT LONG TERM GOAL #4  ? Title Return to [employer]   ? ?  ?  ? ?  ? ? ? ? ? ? ? ? Plan - 04/12/21 0902   ? ? Clinical Impression Statement Patient was introduced to multiple new interventions for improved lumbar mobility needed for improved  function with his daily activities such as golf. He required moderate multimodal cuing with with hip hiking for left oblique and QL engagment. He reported no pain or discomfort with any of today's interventions. Seated lumbar flexion with right rotation and self STM were added to his HEP as these helped to reduce his familiar tightness with rotational activities. Manual therapy focused on soft tissue mobilization to the left QL and lumbar paraspinals for reduced tightness and discomfort. He reported that his back felt good as it was not hurting upon the conclusion of treatment. He continues to require skilled physical therapy to address his remaining impairments to return to his prior level of function.   ? Personal Factors and Comorbidities Comorbidity 1;Comorbidity 2;Other   ? Comorbidities HTN, bilateral TKA's, hernia repair.   ? Examination-Activity Limitations Other;Stand   ? Examination-Participation Restrictions Other;Valla Leaver Work   ? Stability/Clinical Decision Making Stable/Uncomplicated   ? Rehab Potential Excellent   ? PT Frequency 2x / week   ? PT Duration 6 weeks   ? PT Treatment/Interventions ADLs/Self Care Home Management;Cryotherapy;Electrical Stimulation;Ultrasound;Traction;Moist Heat;Therapeutic activities;Functional mobility training;Therapeutic exercise;Manual techniques;Patient/family education;Passive range of motion;Dry needling;Spinal Manipulations   ? PT Next Visit Plan Combo e'stim/US, STW/M including left QL release technique, core exercise progression.   ? Consulted and Agree with Plan of Care Patient   ? ?  ?  ? ?  ? ? ?Patient will benefit from skilled therapeutic intervention in order to improve the following deficits and impairments:  Decreased activity tolerance, Postural dysfunction, Pain, Increased muscle spasms, Decreased range of motion ? ?Visit Diagnosis: ?Acute left-sided low back pain with left-sided sciatica ? ?Abnormal posture ? ? ? ? ?Problem List ?Patient Active Problem List   ? Diagnosis Date Noted  ? Allergic rhinitis 11/16/2020  ? Benign neoplasm of colon 11/16/2020  ? Asbestosis ([place]) 11/16/2020  ? Benign neoplasm of lip 11/16/2020  ? Brachial neuritis 11/16/2020  ? Disorder of skin and subcutaneous tissue 11/16/2020  ? Major depressive disorder, recurrent episode, mild ([place]) 11/16/2020  ? Other bursitis disorders 11/16/2020  ? Stricture and stenosis  of esophagus 11/16/2020  ? Asthma-COPD overlap syndrome ([place]) 07/19/2019  ? Controlled substance agreement signed 11/03/2018  ? Insomnia 04/23/2018  ? Benign prostatic hyperplasia   ? HTN (hypertension)   ? Hyperlipemia   ? Asbestos exposure 05/06/2014  ? Asthma, chronic obstructive, with acute exacerbation ([place]) 07/17/2012  ? GERD (gastroesophageal reflux disease) 05/08/2012  ? Depression 05/08/2012  ? ? ?Darlin Coco, PT ?04/12/2021, 1:14 PM ? ?[place] ?[facility] ?[address] ?[place], [place], [address] ?Phone: [phone]   Fax:  [phone] ? ?Name: Curren Mohrmann. ?MRN: [id number] ?Date of Birth: Oct 22, 1945 ? ? ? ?

## 2021-04-16 ENCOUNTER — Other Ambulatory Visit: Payer: Self-pay

## 2021-04-16 ENCOUNTER — Encounter: Payer: Self-pay | Admitting: Physical Therapy

## 2021-04-16 ENCOUNTER — Ambulatory Visit: Payer: Medicare Other | Admitting: Physical Therapy

## 2021-04-16 DIAGNOSIS — R293 Abnormal posture: Secondary | ICD-10-CM | POA: Diagnosis not present

## 2021-04-16 DIAGNOSIS — M5442 Lumbago with sciatica, left side: Secondary | ICD-10-CM

## 2021-04-16 NOTE — Therapy (Signed)
[place] ?[facility] ?[place] ?[place], [place], [address] ?Phone: [phone]   Fax:  [phone] ? ?Physical Therapy Treatment ? ?Patient Details  ?Name: Blake Madden. ?MRN: [id number] ?Date of Birth: [birth year] ?Referring Provider (PT): Wende Crease MD ? ? ?Encounter Date: 04/16/2021 ? ? PT End of Session - 04/16/21 1356   ? ? Visit Number 5   ? Number of Visits 12   ? Date for PT Re-Evaluation 07/01/21   ? Authorization Type FOTO AT LEAST EVERY 5TH VISIT.  PROGRESS NOTE AT 10TH VISIT.  KX MODIFIER AFTER 15 VISITS.   ? PT Start Time 4680   ? PT Stop Time 1429   late start to treatment  ? PT Time Calculation (min) 35 min   ? Activity Tolerance Patient tolerated treatment well   ? Behavior During Therapy [facility] for tasks assessed/performed   ? ?  ?  ? ?  ? ? ?Past Medical History:  ?Diagnosis Date  ? Allergic rhinitis   ? Allergy   ? Asthma   ? BPH (benign prostatic hypertrophy)   ? Depression   ? GERD (gastroesophageal reflux disease)   ? HTN (hypertension)   ? Hyperlipemia   ? Migraines   ? ? ?Past Surgical History:  ?Procedure Laterality Date  ? INGUINAL HERNIA REPAIR Bilateral 02/07/2016  ? Procedure: LAPAROSCOPIC BILATERAL INGUINAL HERNIA REPAIR WITH UMBILICIAL HERNIA REPAIR;  Surgeon: Coralie Keens, MD;  Location: [place];  Service: General;  Laterality: Bilateral;  ? INSERTION OF MESH Bilateral 02/07/2016  ? Procedure: INSERTION OF MESH TO INGUINAL HERNIAS;  Surgeon: Coralie Keens, MD;  Location: [place];  Service: General;  Laterality: Bilateral;  ? KNEE ARTHROSCOPY Left   ? ? ?There were no vitals filed for this visit. ? ? Subjective Assessment - 04/16/21 1355   ? ? Subjective COVID-19 screen performed prior to patient entering clinic. No current LBP but reports that he is not back to golf yet. Sometimes has pain when standing after sitting..   ? Pertinent History HTN, bilateral TKA's, hernia repair.   ? How long can you sit comfortably? Unlimited   ? How long can  you stand comfortably? ~5 minutes.   ? How long can you walk comfortably? Pain not greatly increased with walking.   ? Patient Stated Goals Get out of pain.  Golf.   ? Currently in Pain? No/denies   ? ?  ?  ? ?  ? ? ? ? ? [facility] PT Assessment - 04/16/21 0001   ? ?  ? Assessment  ? Medical Diagnosis Lumbar pain with radiation down legs.   ? Referring Provider (PT) Wende Crease MD   ? Next MD Visit None   ?  ? Precautions  ? Precautions None   ? ?  ?  ? ?  ? ? ? ? ? ? ? ? ? ? ? ? ? ? ? ? Time Adult PT Treatment/Exercise - 04/16/21 0001   ? ?  ? Lumbar Exercises: Aerobic  ? Nustep L4 x 15 min   ?  ? Lumbar Exercises: Standing  ? Shoulder Extension Strengthening;Both;20 reps;Limitations   ? Shoulder Extension Limitations Blue XTS   ? Other Standing Lumbar Exercises L diagonal blue XTS x20 reps   ? Other Standing Lumbar Exercises --   ?  ? Lumbar Exercises: Supine  ? Bridge 15 reps;5 seconds   ? Bridge Limitations feet on dyandisc with 5 sec holds   ?  ? Lumbar Exercises: Sidelying  ?  Clam Both;20 reps   ?  ? Knee/Hip Exercises: Standing  ? Hip Abduction AROM;Both;20 reps;Knee straight   ? Hip Extension AROM;Both;20 reps;Knee straight   ? ?  ?  ? ?  ? ? ? ? ? ? ? ? ? ? ? ? ? ? ? PT Long Term Goals - 04/16/21 1426   ? ?  ? PT LONG TERM GOAL #1  ? Title Patient will be independent with advanced HEP   ? Time 6   ? Period Weeks   ? Status On-going   ?  ? PT LONG TERM GOAL #2  ? Title Stand 20 minutes with LBP not > 2-3/10.   ? Time 6   ? Period Weeks   ? Status Achieved   ?  ? PT LONG TERM GOAL #3  ? Title Eliminate radiation of pain into left LE.   ? Time 6   ? Period Weeks   ? Status On-going   ?  ? PT LONG TERM GOAL #4  ? Title Return to [employer]   ? Status On-going   ? ?  ?  ? ?  ? ? ? ? ? ? ? ? Plan - 04/16/21 1526   ? ? Clinical Impression Statement Patient able to tolerate therex well today with no complaints of LBP upon arrival or during therex. Patient notes that he may try playing 18 holes of golf later this week.  Patient unpacking and lifting boxes but nothing excessive per patient report. Patient feels as he may be able to do exercises at home. Plan made with PTA was to play golf later this week and return next week to assess his response and symptoms due to some LLE symptoms still present. Patient okay with plan.   ? Personal Factors and Comorbidities Comorbidity 1;Comorbidity 2;Other   ? Comorbidities HTN, bilateral TKA's, hernia repair.   ? Examination-Activity Limitations Other;Stand   ? Examination-Participation Restrictions Other;Valla Leaver Work   ? Stability/Clinical Decision Making Stable/Uncomplicated   ? Rehab Potential Excellent   ? PT Frequency 2x / week   ? PT Duration 6 weeks   ? PT Treatment/Interventions ADLs/Self Care Home Management;Cryotherapy;Electrical Stimulation;Ultrasound;Traction;Moist Heat;Therapeutic activities;Functional mobility training;Therapeutic exercise;Manual techniques;Patient/family education;Passive range of motion;Dry needling;Spinal Manipulations   ? PT Next Visit Plan Combo e'stim/US, STW/M including left QL release technique, core exercise progression.   ? Consulted and Agree with Plan of Care Patient   ? ?  ?  ? ?  ? ? ?Patient will benefit from skilled therapeutic intervention in order to improve the following deficits and impairments:  Decreased activity tolerance, Postural dysfunction, Pain, Increased muscle spasms, Decreased range of motion ? ?Visit Diagnosis: ?Acute left-sided low back pain with left-sided sciatica ? ?Abnormal posture ? ? ? ? ?Problem List ?Patient Active Problem List  ? Diagnosis Date Noted  ? Allergic rhinitis 11/16/2020  ? Benign neoplasm of colon 11/16/2020  ? Asbestosis ([place]) 11/16/2020  ? Benign neoplasm of lip 11/16/2020  ? Brachial neuritis 11/16/2020  ? Disorder of skin and subcutaneous tissue 11/16/2020  ? Major depressive disorder, recurrent episode, mild ([place]) 11/16/2020  ? Other bursitis disorders 11/16/2020  ? Stricture and stenosis of esophagus  11/16/2020  ? Asthma-COPD overlap syndrome ([place]) 07/19/2019  ? Controlled substance agreement signed 11/03/2018  ? Insomnia 04/23/2018  ? Benign prostatic hyperplasia   ? HTN (hypertension)   ? Hyperlipemia   ? Asbestos exposure 05/06/2014  ? Asthma, chronic obstructive, with acute exacerbation ([place]) 07/17/2012  ? GERD (gastroesophageal reflux  disease) 05/08/2012  ? Depression 05/08/2012  ? ? ?Standley Brooking, PTA ?04/16/2021, 3:30 PM ? ?[place] ?[facility] ?[address] ?[place], [place], [address] ?Phone: [phone]   Fax:  [phone] ? ?Name: Valerie Fredin. ?MRN: [id number] ?Date of Birth: Jun 08, 1945 ? ? ? ?

## 2021-04-23 ENCOUNTER — Ambulatory Visit: Payer: Medicare Other

## 2021-04-23 ENCOUNTER — Other Ambulatory Visit: Payer: Self-pay

## 2021-04-23 DIAGNOSIS — R293 Abnormal posture: Secondary | ICD-10-CM

## 2021-04-23 DIAGNOSIS — M5442 Lumbago with sciatica, left side: Secondary | ICD-10-CM | POA: Diagnosis not present

## 2021-04-23 NOTE — Therapy (Signed)
[place] ?[facility] ?[place] ?[place], [place], [address] ?Phone: [phone]   Fax:  [phone] ? ?Physical Therapy Treatment ? ?Patient Details  ?Name: Blake Madden. ?MRN: [id number] ?Date of Birth: 10-17-1945 ?Referring Provider (PT): Wende Crease MD ? ? ?Encounter Date: 04/23/2021 ? ? PT End of Session - 04/23/21 0949   ? ? Visit Number 6   ? Number of Visits 12   ? Date for PT Re-Evaluation 07/01/21   ? Authorization Type FOTO AT LEAST EVERY 5TH VISIT.  PROGRESS NOTE AT 10TH VISIT.  KX MODIFIER AFTER 15 VISITS.   ? PT Start Time 0174   ? PT Stop Time 1030   ? PT Time Calculation (min) 45 min   ? Activity Tolerance Patient tolerated treatment well   ? Behavior During Therapy [facility] for tasks assessed/performed   ? ?  ?  ? ?  ? ? ?Past Medical History:  ?Diagnosis Date  ? Allergic rhinitis   ? Allergy   ? Asthma   ? BPH (benign prostatic hypertrophy)   ? Depression   ? GERD (gastroesophageal reflux disease)   ? HTN (hypertension)   ? Hyperlipemia   ? Migraines   ? ? ?Past Surgical History:  ?Procedure Laterality Date  ? INGUINAL HERNIA REPAIR Bilateral 02/07/2016  ? Procedure: LAPAROSCOPIC BILATERAL INGUINAL HERNIA REPAIR WITH UMBILICIAL HERNIA REPAIR;  Surgeon: Coralie Keens, MD;  Location: [place];  Service: General;  Laterality: Bilateral;  ? INSERTION OF MESH Bilateral 02/07/2016  ? Procedure: INSERTION OF MESH TO INGUINAL HERNIAS;  Surgeon: Coralie Keens, MD;  Location: [place];  Service: General;  Laterality: Bilateral;  ? KNEE ARTHROSCOPY Left   ? ? ?There were no vitals filed for this visit. ? ? Subjective Assessment - 04/23/21 0947   ? ? Subjective COVID-19 screen performed prior to patient entering clinic. Patient reports that his back feels good today. He was able to play a full 18 holes since his last appointment without being limited by his pain.   ? Pertinent History HTN, bilateral TKA's, hernia repair.   ? How long can you sit comfortably? Unlimited   ? How  long can you stand comfortably? ~5 minutes.   ? How long can you walk comfortably? Pain not greatly increased with walking.   ? Patient Stated Goals Get out of pain.  Golf.   ? Currently in Pain? No/denies   ? ?  ?  ? ?  ? ? ? ? ? ? ? ? ? ? ? ? ? ? ? ? ? ? ? ? [place] Adult PT Treatment/Exercise - 04/23/21 0001   ? ?  ? Lumbar Exercises: Stretches  ? Standing Side Bend Right;10 seconds   10 reps  ?  ? Lumbar Exercises: Aerobic  ? Recumbent Bike L3 x 15 minutes   ?  ? Lumbar Exercises: Machines for Strengthening  ? Cybex Lumbar Extension 110# for 2 minutes   ?  ? Lumbar Exercises: Standing  ? Functional Squats 20 reps;10 reps   ? Functional Squats Limitations holding 10 pound kettlebell   ? Other Standing Lumbar Exercises Diagonals   Blue XTS; 25 reps each  ? Other Standing Lumbar Exercises Lumbar flexion   15 reps  ?  ? Knee/Hip Exercises: Standing  ? Lateral Step Up Both;2 sets;10 reps;Hand Hold: 1;Step Height: 6"   ? Forward Step Up Both;Hand Hold: 2;Other (comment)   onto BOSU; 2 minutes  ? Other Standing Knee Exercises Side stepping  red t-band at ankles; 2 minutes  ? ?  ?  ? ?  ? ? ? ? ? ? ? ? ? ? ? ? ? ? ? PT Long Term Goals - 04/16/21 1426   ? ?  ? PT LONG TERM GOAL #1  ? Title Patient will be independent with advanced HEP   ? Time 6   ? Period Weeks   ? Status On-going   ?  ? PT LONG TERM GOAL #2  ? Title Stand 20 minutes with LBP not > 2-3/10.   ? Time 6   ? Period Weeks   ? Status Achieved   ?  ? PT LONG TERM GOAL #3  ? Title Eliminate radiation of pain into left LE.   ? Time 6   ? Period Weeks   ? Status On-going   ?  ? PT LONG TERM GOAL #4  ? Title Return to [employer]   ? Status On-going   ? ?  ?  ? ?  ? ? ? ? ? ? ? ? Plan - 04/23/21 0949   ? ? Clinical Impression Statement Patient was progressed with multiple new and familiar interventions with moderate difficulty. He was able to complete all of today's interventions with no pain or discomfort. He required minimal cuing with today's new interventions  initially, but was then able to [facility] proper biomechanics. He reported that his back felt good upon the conclusion of treatment. He continues to require skilled physical therapy to address his remaining impairments to return to his prior level of function.   ? Personal Factors and Comorbidities Comorbidity 1;Comorbidity 2;Other   ? Comorbidities HTN, bilateral TKA's, hernia repair.   ? Examination-Activity Limitations Other;Stand   ? Examination-Participation Restrictions Other;Valla Leaver Work   ? Stability/Clinical Decision Making Stable/Uncomplicated   ? Rehab Potential Excellent   ? PT Frequency 2x / week   ? PT Duration 6 weeks   ? PT Treatment/Interventions ADLs/Self Care Home Management;Cryotherapy;Electrical Stimulation;Ultrasound;Traction;Moist Heat;Therapeutic activities;Functional mobility training;Therapeutic exercise;Manual techniques;Patient/family education;Passive range of motion;Dry needling;Spinal Manipulations   ? PT Next Visit Plan Combo e'stim/US, STW/M including left QL release technique, core exercise progression.   ? Consulted and Agree with Plan of Care Patient   ? ?  ?  ? ?  ? ? ?Patient will benefit from skilled therapeutic intervention in order to improve the following deficits and impairments:  Decreased activity tolerance, Postural dysfunction, Pain, Increased muscle spasms, Decreased range of motion ? ?Visit Diagnosis: ?Acute left-sided low back pain with left-sided sciatica ? ?Abnormal posture ? ? ? ? ?Problem List ?Patient Active Problem List  ? Diagnosis Date Noted  ? Allergic rhinitis 11/16/2020  ? Benign neoplasm of colon 11/16/2020  ? Asbestosis ([place]) 11/16/2020  ? Benign neoplasm of lip 11/16/2020  ? Brachial neuritis 11/16/2020  ? Disorder of skin and subcutaneous tissue 11/16/2020  ? Major depressive disorder, recurrent episode, mild ([place]) 11/16/2020  ? Other bursitis disorders 11/16/2020  ? Stricture and stenosis of esophagus 11/16/2020  ? Asthma-COPD overlap syndrome ([place])  07/19/2019  ? Controlled substance agreement signed 11/03/2018  ? Insomnia 04/23/2018  ? Benign prostatic hyperplasia   ? HTN (hypertension)   ? Hyperlipemia   ? Asbestos exposure 05/06/2014  ? Asthma, chronic obstructive, with acute exacerbation ([place]) 07/17/2012  ? GERD (gastroesophageal reflux disease) 05/08/2012  ? Depression 05/08/2012  ? ? ?Darlin Coco, PT ?04/23/2021, 12:37 PM ? ?[place] ?[facility] ?[address] ?[place], [place], [address] ?Phone: [phone]  Fax:  [phone] ? ?Name: Roscoe Witts. ?MRN: [id number] ?Date of Birth: [birth year] ? ? ? ?

## 2021-04-26 ENCOUNTER — Ambulatory Visit: Payer: Medicare Other

## 2021-04-26 DIAGNOSIS — M5442 Lumbago with sciatica, left side: Secondary | ICD-10-CM

## 2021-04-26 DIAGNOSIS — R293 Abnormal posture: Secondary | ICD-10-CM | POA: Diagnosis not present

## 2021-04-26 NOTE — Therapy (Signed)
[place] ?[facility] ?[address] ?[place], [place], [address] ?Phone: [phone]   Fax:  [phone] ? ?Physical Therapy Treatment ? ?Patient Details  ?Name: Blake Madden. ?MRN: [id number] ?Date of Birth: July 22, 1945 ?Referring Provider (PT): Wende Crease MD ? ? ?Encounter Date: 04/26/2021 ? ? PT End of Session - 04/26/21 1519   ? ? Visit Number 7   ? Number of Visits 12   ? Date for PT Re-Evaluation 07/01/21   ? Authorization Type FOTO AT LEAST EVERY 5TH VISIT.  PROGRESS NOTE AT 10TH VISIT.  KX MODIFIER AFTER 15 VISITS.   ? PT Start Time 7494   ? PT Stop Time 4967   ? PT Time Calculation (min) 29 min   ? Activity Tolerance Patient tolerated treatment well   ? Behavior During Therapy [facility] for tasks assessed/performed   ? ?  ?  ? ?  ? ? ?Past Medical History:  ?Diagnosis Date  ? Allergic rhinitis   ? Allergy   ? Asthma   ? BPH (benign prostatic hypertrophy)   ? Depression   ? GERD (gastroesophageal reflux disease)   ? HTN (hypertension)   ? Hyperlipemia   ? Migraines   ? ? ?Past Surgical History:  ?Procedure Laterality Date  ? INGUINAL HERNIA REPAIR Bilateral 02/07/2016  ? Procedure: LAPAROSCOPIC BILATERAL INGUINAL HERNIA REPAIR WITH UMBILICIAL HERNIA REPAIR;  Surgeon: Coralie Keens, MD;  Location: [place];  Service: General;  Laterality: Bilateral;  ? INSERTION OF MESH Bilateral 02/07/2016  ? Procedure: INSERTION OF MESH TO INGUINAL HERNIAS;  Surgeon: Coralie Keens, MD;  Location: [place];  Service: General;  Laterality: Bilateral;  ? KNEE ARTHROSCOPY Left   ? ? ?There were no vitals filed for this visit. ? ? Subjective Assessment - 04/26/21 1518   ? ? Subjective COVID-19 screen performed prior to patient entering clinic. Patient reports that he feels a little sore when he first gets out of the car, but otherwise he feels good. He was able to play golf without hurting.   ? Pertinent History HTN, bilateral TKA's, hernia repair.   ? How long can you sit comfortably? Unlimited   ?  How long can you stand comfortably? ~5 minutes.   ? How long can you walk comfortably? Pain not greatly increased with walking.   ? Patient Stated Goals Get out of pain.  Golf.   ? Currently in Pain? No/denies   ? ?  ?  ? ?  ? ? ? ? ? [facility] PT Assessment - 04/26/21 0001   ? ?  ? Observation/Other Assessments  ? Focus on Therapeutic Outcomes (FOTO)  79.74   ? ?  ?  ? ?  ? ? ? ? ? ? ? ? ? ? ? ? ? ? ? ? [place] Adult PT Treatment/Exercise - 04/26/21 0001   ? ?  ? Lumbar Exercises: Stretches  ? Other Lumbar Stretch Exercise Lumbar flexion with rotation   at counter; 20 reps each  ?  ? Lumbar Exercises: Aerobic  ? Recumbent Bike L3 x 15 minutes   ?  ? Lumbar Exercises: Machines for Strengthening  ? Cybex Lumbar Extension 110# for 3 minutes   ? Other Lumbar Machine Exercise Lat pull down   20 reps @ 50 lbs  ? ?  ?  ? ?  ? ? ? ? ? ? ? ? ? ? ? ? ? ? ? PT Long Term Goals - 04/26/21 1528   ? ?  ? PT  LONG TERM GOAL #1  ? Title Patient will be independent with advanced HEP   ? Time 6   ? Period Weeks   ? Status Achieved   ?  ? PT LONG TERM GOAL #2  ? Title Stand 20 minutes with LBP not > 2-3/10.   ? Time 6   ? Period Weeks   ? Status Achieved   ?  ? PT LONG TERM GOAL #3  ? Title Eliminate radiation of pain into left LE.   ? Time 6   ? Period Weeks   ? Status Achieved   ?  ? PT LONG TERM GOAL #4  ? Title Return to [employer]   ? Status Achieved   ? ?  ?  ? ?  ? ? ? ? ? ? ? ? Plan - 04/26/21 1528   ? ? Clinical Impression Statement Treatment focused on multiple new and familiar activities for an updated HEP as he felt comfortable being placed on a hold for 2 weeks to evaluate his ability to complete his daily activities without low back pain. He will be discharged at that time barring any set backs or flare ups in his symptoms. He was able to complete all of today's interventions without any pain or discomfort. He felt comfortable with his HEP. He will be discharged in 2 weeks, barring any exacerbations.   ? Personal Factors and  Comorbidities Comorbidity 1;Comorbidity 2;Other   ? Comorbidities HTN, bilateral TKA's, hernia repair.   ? Examination-Activity Limitations Other;Stand   ? Examination-Participation Restrictions Other;Valla Leaver Work   ? Stability/Clinical Decision Making Stable/Uncomplicated   ? Rehab Potential Excellent   ? PT Frequency 2x / week   ? PT Duration 6 weeks   ? PT Treatment/Interventions ADLs/Self Care Home Management;Cryotherapy;Electrical Stimulation;Ultrasound;Traction;Moist Heat;Therapeutic activities;Functional mobility training;Therapeutic exercise;Manual techniques;Patient/family education;Passive range of motion;Dry needling;Spinal Manipulations   ? PT Next Visit Plan Combo e'stim/US, STW/M including left QL release technique, core exercise progression.   ? Consulted and Agree with Plan of Care Patient   ? ?  ?  ? ?  ? ? ?Patient will benefit from skilled therapeutic intervention in order to improve the following deficits and impairments:  Decreased activity tolerance, Postural dysfunction, Pain, Increased muscle spasms, Decreased range of motion ? ?Visit Diagnosis: ?Acute left-sided low back pain with left-sided sciatica ? ?Abnormal posture ? ? ? ? ?Problem List ?Patient Active Problem List  ? Diagnosis Date Noted  ? Allergic rhinitis 11/16/2020  ? Benign neoplasm of colon 11/16/2020  ? Asbestosis ([place]) 11/16/2020  ? Benign neoplasm of lip 11/16/2020  ? Brachial neuritis 11/16/2020  ? Disorder of skin and subcutaneous tissue 11/16/2020  ? Major depressive disorder, recurrent episode, mild ([place]) 11/16/2020  ? Other bursitis disorders 11/16/2020  ? Stricture and stenosis of esophagus 11/16/2020  ? Asthma-COPD overlap syndrome ([place]) 07/19/2019  ? Controlled substance agreement signed 11/03/2018  ? Insomnia 04/23/2018  ? Benign prostatic hyperplasia   ? HTN (hypertension)   ? Hyperlipemia   ? Asbestos exposure 05/06/2014  ? Asthma, chronic obstructive, with acute exacerbation ([place]) 07/17/2012  ? GERD (gastroesophageal  reflux disease) 05/08/2012  ? Depression 05/08/2012  ? ? ?Darlin Coco, PT ?04/26/2021, 4:05 PM ? ?[place] ?[facility] ?[address] ?[place], [place], [address] ?Phone: [phone]   Fax:  [phone] ? ?Name: Blake Madden. ?MRN: [id number] ?Date of Birth: 1945/08/29 ? ? ? ?

## 2021-05-30 DIAGNOSIS — M4186 Other forms of scoliosis, lumbar region: Secondary | ICD-10-CM | POA: Diagnosis not present

## 2021-05-30 DIAGNOSIS — M47817 Spondylosis without myelopathy or radiculopathy, lumbosacral region: Secondary | ICD-10-CM | POA: Diagnosis not present

## 2021-05-30 DIAGNOSIS — M545 Low back pain, unspecified: Secondary | ICD-10-CM | POA: Diagnosis not present

## 2021-05-30 DIAGNOSIS — M79604 Pain in right leg: Secondary | ICD-10-CM | POA: Diagnosis not present

## 2021-05-30 DIAGNOSIS — M47816 Spondylosis without myelopathy or radiculopathy, lumbar region: Secondary | ICD-10-CM | POA: Diagnosis not present

## 2021-05-30 DIAGNOSIS — M5136 Other intervertebral disc degeneration, lumbar region: Secondary | ICD-10-CM | POA: Diagnosis not present

## 2021-05-30 DIAGNOSIS — M79605 Pain in left leg: Secondary | ICD-10-CM | POA: Diagnosis not present

## 2021-06-05 DIAGNOSIS — M79604 Pain in right leg: Secondary | ICD-10-CM | POA: Diagnosis not present

## 2021-06-05 DIAGNOSIS — I10 Essential (primary) hypertension: Secondary | ICD-10-CM | POA: Diagnosis not present

## 2021-06-05 DIAGNOSIS — M545 Low back pain, unspecified: Secondary | ICD-10-CM | POA: Diagnosis not present

## 2021-06-05 DIAGNOSIS — M79605 Pain in left leg: Secondary | ICD-10-CM | POA: Diagnosis not present

## 2021-06-05 DIAGNOSIS — M5136 Other intervertebral disc degeneration, lumbar region: Secondary | ICD-10-CM | POA: Diagnosis not present

## 2021-06-05 DIAGNOSIS — E785 Hyperlipidemia, unspecified: Secondary | ICD-10-CM | POA: Diagnosis not present

## 2021-06-05 DIAGNOSIS — J449 Chronic obstructive pulmonary disease, unspecified: Secondary | ICD-10-CM | POA: Diagnosis not present

## 2021-06-19 DIAGNOSIS — M5116 Intervertebral disc disorders with radiculopathy, lumbar region: Secondary | ICD-10-CM | POA: Diagnosis not present

## 2021-06-19 DIAGNOSIS — M79605 Pain in left leg: Secondary | ICD-10-CM | POA: Diagnosis not present

## 2021-06-19 DIAGNOSIS — M79604 Pain in right leg: Secondary | ICD-10-CM | POA: Diagnosis not present

## 2021-06-19 DIAGNOSIS — M5117 Intervertebral disc disorders with radiculopathy, lumbosacral region: Secondary | ICD-10-CM | POA: Diagnosis not present

## 2021-06-19 DIAGNOSIS — M5136 Other intervertebral disc degeneration, lumbar region: Secondary | ICD-10-CM | POA: Diagnosis not present

## 2021-06-19 DIAGNOSIS — M545 Low back pain, unspecified: Secondary | ICD-10-CM | POA: Diagnosis not present

## 2021-06-19 DIAGNOSIS — M4726 Other spondylosis with radiculopathy, lumbar region: Secondary | ICD-10-CM | POA: Diagnosis not present

## 2021-06-19 DIAGNOSIS — M4727 Other spondylosis with radiculopathy, lumbosacral region: Secondary | ICD-10-CM | POA: Diagnosis not present

## 2021-07-13 DIAGNOSIS — M5442 Lumbago with sciatica, left side: Secondary | ICD-10-CM | POA: Diagnosis not present

## 2021-07-13 DIAGNOSIS — M47816 Spondylosis without myelopathy or radiculopathy, lumbar region: Secondary | ICD-10-CM | POA: Diagnosis not present

## 2021-07-13 DIAGNOSIS — M5136 Other intervertebral disc degeneration, lumbar region: Secondary | ICD-10-CM | POA: Diagnosis not present

## 2021-07-13 DIAGNOSIS — M5441 Lumbago with sciatica, right side: Secondary | ICD-10-CM | POA: Diagnosis not present

## 2021-07-13 DIAGNOSIS — G8929 Other chronic pain: Secondary | ICD-10-CM | POA: Diagnosis not present

## 2021-07-25 DIAGNOSIS — M5136 Other intervertebral disc degeneration, lumbar region: Secondary | ICD-10-CM | POA: Diagnosis not present

## 2021-07-25 DIAGNOSIS — M5416 Radiculopathy, lumbar region: Secondary | ICD-10-CM | POA: Diagnosis not present

## 2021-07-29 ENCOUNTER — Other Ambulatory Visit: Payer: Self-pay | Admitting: Family

## 2021-07-29 DIAGNOSIS — K2100 Gastro-esophageal reflux disease with esophagitis, without bleeding: Secondary | ICD-10-CM

## 2021-08-24 DIAGNOSIS — M5136 Other intervertebral disc degeneration, lumbar region: Secondary | ICD-10-CM | POA: Diagnosis not present

## 2021-08-24 DIAGNOSIS — M5442 Lumbago with sciatica, left side: Secondary | ICD-10-CM | POA: Diagnosis not present

## 2021-08-24 DIAGNOSIS — M5441 Lumbago with sciatica, right side: Secondary | ICD-10-CM | POA: Diagnosis not present

## 2021-08-24 DIAGNOSIS — M47816 Spondylosis without myelopathy or radiculopathy, lumbar region: Secondary | ICD-10-CM | POA: Diagnosis not present

## 2021-08-24 DIAGNOSIS — G8929 Other chronic pain: Secondary | ICD-10-CM | POA: Diagnosis not present

## 2021-08-24 DIAGNOSIS — M5416 Radiculopathy, lumbar region: Secondary | ICD-10-CM | POA: Diagnosis not present

## 2021-09-11 DIAGNOSIS — Z85828 Personal history of other malignant neoplasm of skin: Secondary | ICD-10-CM | POA: Diagnosis not present

## 2021-09-11 DIAGNOSIS — L821 Other seborrheic keratosis: Secondary | ICD-10-CM | POA: Diagnosis not present

## 2021-09-11 DIAGNOSIS — L814 Other melanin hyperpigmentation: Secondary | ICD-10-CM | POA: Diagnosis not present

## 2021-09-11 DIAGNOSIS — L579 Skin changes due to chronic exposure to nonionizing radiation, unspecified: Secondary | ICD-10-CM | POA: Diagnosis not present

## 2021-09-11 DIAGNOSIS — L820 Inflamed seborrheic keratosis: Secondary | ICD-10-CM | POA: Diagnosis not present

## 2021-09-11 DIAGNOSIS — D225 Melanocytic nevi of trunk: Secondary | ICD-10-CM | POA: Diagnosis not present

## 2021-09-17 ENCOUNTER — Other Ambulatory Visit: Payer: Self-pay | Admitting: Family

## 2021-09-26 DIAGNOSIS — M5136 Other intervertebral disc degeneration, lumbar region: Secondary | ICD-10-CM | POA: Diagnosis not present

## 2021-09-26 DIAGNOSIS — M47816 Spondylosis without myelopathy or radiculopathy, lumbar region: Secondary | ICD-10-CM | POA: Diagnosis not present

## 2021-10-19 ENCOUNTER — Other Ambulatory Visit: Payer: Self-pay | Admitting: Family

## 2021-10-19 DIAGNOSIS — E785 Hyperlipidemia, unspecified: Secondary | ICD-10-CM

## 2021-10-30 DIAGNOSIS — M48062 Spinal stenosis, lumbar region with neurogenic claudication: Secondary | ICD-10-CM | POA: Diagnosis not present

## 2021-10-30 DIAGNOSIS — M47816 Spondylosis without myelopathy or radiculopathy, lumbar region: Secondary | ICD-10-CM | POA: Diagnosis not present

## 2021-10-30 DIAGNOSIS — M5136 Other intervertebral disc degeneration, lumbar region: Secondary | ICD-10-CM | POA: Diagnosis not present

## 2021-11-08 ENCOUNTER — Other Ambulatory Visit: Payer: Self-pay | Admitting: Family

## 2021-11-08 DIAGNOSIS — E785 Hyperlipidemia, unspecified: Secondary | ICD-10-CM

## 2021-11-08 NOTE — Telephone Encounter (Signed)
[place]. NTBS 30 days given 10/19/21

## 2021-11-08 NOTE — Telephone Encounter (Signed)
Patient is seeing Dr. Wende Crease in [place]

## 2021-11-19 DIAGNOSIS — Z23 Encounter for immunization: Secondary | ICD-10-CM | POA: Diagnosis not present

## 2021-11-20 DIAGNOSIS — M5136 Other intervertebral disc degeneration, lumbar region: Secondary | ICD-10-CM | POA: Diagnosis not present

## 2021-11-20 DIAGNOSIS — M5416 Radiculopathy, lumbar region: Secondary | ICD-10-CM | POA: Diagnosis not present

## 2021-11-20 DIAGNOSIS — M47816 Spondylosis without myelopathy or radiculopathy, lumbar region: Secondary | ICD-10-CM | POA: Diagnosis not present

## 2021-11-20 DIAGNOSIS — G8929 Other chronic pain: Secondary | ICD-10-CM | POA: Diagnosis not present

## 2021-11-20 DIAGNOSIS — M5442 Lumbago with sciatica, left side: Secondary | ICD-10-CM | POA: Diagnosis not present

## 2021-11-20 DIAGNOSIS — M5441 Lumbago with sciatica, right side: Secondary | ICD-10-CM | POA: Diagnosis not present

## 2021-11-24 ENCOUNTER — Other Ambulatory Visit: Payer: Self-pay | Admitting: Family

## 2021-11-24 DIAGNOSIS — E785 Hyperlipidemia, unspecified: Secondary | ICD-10-CM

## 2021-11-29 DIAGNOSIS — M48061 Spinal stenosis, lumbar region without neurogenic claudication: Secondary | ICD-10-CM | POA: Diagnosis not present

## 2021-11-29 DIAGNOSIS — M5136 Other intervertebral disc degeneration, lumbar region: Secondary | ICD-10-CM | POA: Diagnosis not present

## 2021-12-06 DIAGNOSIS — R202 Paresthesia of skin: Secondary | ICD-10-CM | POA: Diagnosis not present

## 2021-12-06 DIAGNOSIS — N401 Enlarged prostate with lower urinary tract symptoms: Secondary | ICD-10-CM | POA: Diagnosis not present

## 2021-12-06 DIAGNOSIS — I10 Essential (primary) hypertension: Secondary | ICD-10-CM | POA: Diagnosis not present

## 2021-12-06 DIAGNOSIS — Z23 Encounter for immunization: Secondary | ICD-10-CM | POA: Diagnosis not present

## 2021-12-06 DIAGNOSIS — Z0000 Encounter for general adult medical examination without abnormal findings: Secondary | ICD-10-CM | POA: Diagnosis not present

## 2021-12-06 DIAGNOSIS — J4489 Other specified chronic obstructive pulmonary disease: Secondary | ICD-10-CM | POA: Diagnosis not present

## 2021-12-06 DIAGNOSIS — I2510 Atherosclerotic heart disease of native coronary artery without angina pectoris: Secondary | ICD-10-CM | POA: Diagnosis not present

## 2021-12-06 DIAGNOSIS — M48062 Spinal stenosis, lumbar region with neurogenic claudication: Secondary | ICD-10-CM | POA: Diagnosis not present

## 2021-12-06 DIAGNOSIS — R351 Nocturia: Secondary | ICD-10-CM | POA: Diagnosis not present

## 2021-12-06 DIAGNOSIS — E782 Mixed hyperlipidemia: Secondary | ICD-10-CM | POA: Diagnosis not present

## 2021-12-18 DIAGNOSIS — I2584 Coronary atherosclerosis due to calcified coronary lesion: Secondary | ICD-10-CM | POA: Diagnosis not present

## 2021-12-18 DIAGNOSIS — I2510 Atherosclerotic heart disease of native coronary artery without angina pectoris: Secondary | ICD-10-CM | POA: Diagnosis not present

## 2021-12-18 DIAGNOSIS — E782 Mixed hyperlipidemia: Secondary | ICD-10-CM | POA: Diagnosis not present

## 2021-12-18 DIAGNOSIS — Z9189 Other specified personal risk factors, not elsewhere classified: Secondary | ICD-10-CM | POA: Diagnosis not present

## 2021-12-18 DIAGNOSIS — I10 Essential (primary) hypertension: Secondary | ICD-10-CM | POA: Diagnosis not present

## 2021-12-19 DIAGNOSIS — E782 Mixed hyperlipidemia: Secondary | ICD-10-CM | POA: Diagnosis not present

## 2021-12-19 DIAGNOSIS — I2584 Coronary atherosclerosis due to calcified coronary lesion: Secondary | ICD-10-CM | POA: Diagnosis not present

## 2021-12-19 DIAGNOSIS — I2510 Atherosclerotic heart disease of native coronary artery without angina pectoris: Secondary | ICD-10-CM | POA: Diagnosis not present

## 2021-12-24 DIAGNOSIS — M5136 Other intervertebral disc degeneration, lumbar region: Secondary | ICD-10-CM | POA: Diagnosis not present

## 2021-12-24 DIAGNOSIS — M532X6 Spinal instabilities, lumbar region: Secondary | ICD-10-CM | POA: Diagnosis not present

## 2021-12-24 DIAGNOSIS — M4316 Spondylolisthesis, lumbar region: Secondary | ICD-10-CM | POA: Diagnosis present

## 2021-12-24 DIAGNOSIS — G8929 Other chronic pain: Secondary | ICD-10-CM | POA: Diagnosis present

## 2021-12-24 DIAGNOSIS — N400 Enlarged prostate without lower urinary tract symptoms: Secondary | ICD-10-CM | POA: Diagnosis present

## 2021-12-24 DIAGNOSIS — E782 Mixed hyperlipidemia: Secondary | ICD-10-CM | POA: Diagnosis present

## 2021-12-24 DIAGNOSIS — H4912 Fourth [trochlear] nerve palsy, left eye: Secondary | ICD-10-CM | POA: Diagnosis present

## 2021-12-24 DIAGNOSIS — I10 Essential (primary) hypertension: Secondary | ICD-10-CM | POA: Diagnosis present

## 2021-12-24 DIAGNOSIS — M48061 Spinal stenosis, lumbar region without neurogenic claudication: Secondary | ICD-10-CM | POA: Diagnosis not present

## 2021-12-24 DIAGNOSIS — F330 Major depressive disorder, recurrent, mild: Secondary | ICD-10-CM | POA: Diagnosis present

## 2021-12-24 DIAGNOSIS — J4489 Other specified chronic obstructive pulmonary disease: Secondary | ICD-10-CM | POA: Diagnosis present

## 2021-12-24 DIAGNOSIS — M4807 Spinal stenosis, lumbosacral region: Secondary | ICD-10-CM | POA: Diagnosis present

## 2021-12-24 DIAGNOSIS — K219 Gastro-esophageal reflux disease without esophagitis: Secondary | ICD-10-CM | POA: Diagnosis present

## 2021-12-24 DIAGNOSIS — M5416 Radiculopathy, lumbar region: Secondary | ICD-10-CM | POA: Diagnosis present

## 2021-12-24 DIAGNOSIS — M5126 Other intervertebral disc displacement, lumbar region: Secondary | ICD-10-CM | POA: Diagnosis not present

## 2021-12-24 DIAGNOSIS — Z8249 Family history of ischemic heart disease and other diseases of the circulatory system: Secondary | ICD-10-CM | POA: Diagnosis not present

## 2021-12-24 DIAGNOSIS — Z79899 Other long term (current) drug therapy: Secondary | ICD-10-CM | POA: Diagnosis not present

## 2021-12-24 DIAGNOSIS — J449 Chronic obstructive pulmonary disease, unspecified: Secondary | ICD-10-CM | POA: Diagnosis not present

## 2021-12-24 DIAGNOSIS — J45909 Unspecified asthma, uncomplicated: Secondary | ICD-10-CM | POA: Diagnosis not present

## 2021-12-24 DIAGNOSIS — M48062 Spinal stenosis, lumbar region with neurogenic claudication: Secondary | ICD-10-CM | POA: Diagnosis present

## 2021-12-26 DIAGNOSIS — E782 Mixed hyperlipidemia: Secondary | ICD-10-CM | POA: Diagnosis present

## 2021-12-26 DIAGNOSIS — M48061 Spinal stenosis, lumbar region without neurogenic claudication: Secondary | ICD-10-CM | POA: Diagnosis not present

## 2021-12-26 DIAGNOSIS — Z8249 Family history of ischemic heart disease and other diseases of the circulatory system: Secondary | ICD-10-CM | POA: Diagnosis not present

## 2021-12-26 DIAGNOSIS — M4316 Spondylolisthesis, lumbar region: Secondary | ICD-10-CM | POA: Diagnosis present

## 2021-12-26 DIAGNOSIS — G8929 Other chronic pain: Secondary | ICD-10-CM | POA: Diagnosis present

## 2021-12-26 DIAGNOSIS — N400 Enlarged prostate without lower urinary tract symptoms: Secondary | ICD-10-CM | POA: Diagnosis present

## 2021-12-26 DIAGNOSIS — F330 Major depressive disorder, recurrent, mild: Secondary | ICD-10-CM | POA: Diagnosis present

## 2021-12-26 DIAGNOSIS — J4489 Other specified chronic obstructive pulmonary disease: Secondary | ICD-10-CM | POA: Diagnosis present

## 2021-12-26 DIAGNOSIS — M5416 Radiculopathy, lumbar region: Secondary | ICD-10-CM | POA: Diagnosis present

## 2021-12-26 DIAGNOSIS — Z79899 Other long term (current) drug therapy: Secondary | ICD-10-CM | POA: Diagnosis not present

## 2021-12-26 DIAGNOSIS — K219 Gastro-esophageal reflux disease without esophagitis: Secondary | ICD-10-CM | POA: Diagnosis present

## 2021-12-26 DIAGNOSIS — M4807 Spinal stenosis, lumbosacral region: Secondary | ICD-10-CM | POA: Diagnosis present

## 2021-12-26 DIAGNOSIS — H4912 Fourth [trochlear] nerve palsy, left eye: Secondary | ICD-10-CM | POA: Diagnosis present

## 2021-12-26 DIAGNOSIS — I10 Essential (primary) hypertension: Secondary | ICD-10-CM | POA: Diagnosis present

## 2021-12-26 DIAGNOSIS — M48062 Spinal stenosis, lumbar region with neurogenic claudication: Secondary | ICD-10-CM | POA: Diagnosis present

## 2022-01-02 DIAGNOSIS — Z9889 Other specified postprocedural states: Secondary | ICD-10-CM | POA: Diagnosis not present

## 2022-01-02 DIAGNOSIS — I10 Essential (primary) hypertension: Secondary | ICD-10-CM | POA: Diagnosis not present

## 2022-01-02 DIAGNOSIS — M48061 Spinal stenosis, lumbar region without neurogenic claudication: Secondary | ICD-10-CM | POA: Diagnosis not present

## 2022-03-12 DIAGNOSIS — D225 Melanocytic nevi of trunk: Secondary | ICD-10-CM | POA: Diagnosis not present

## 2022-03-12 DIAGNOSIS — L570 Actinic keratosis: Secondary | ICD-10-CM | POA: Diagnosis not present

## 2022-03-12 DIAGNOSIS — Z85828 Personal history of other malignant neoplasm of skin: Secondary | ICD-10-CM | POA: Diagnosis not present

## 2022-03-12 DIAGNOSIS — L814 Other melanin hyperpigmentation: Secondary | ICD-10-CM | POA: Diagnosis not present

## 2022-03-12 DIAGNOSIS — L821 Other seborrheic keratosis: Secondary | ICD-10-CM | POA: Diagnosis not present

## 2022-03-12 DIAGNOSIS — L579 Skin changes due to chronic exposure to nonionizing radiation, unspecified: Secondary | ICD-10-CM | POA: Diagnosis not present

## 2022-03-26 DIAGNOSIS — I2584 Coronary atherosclerosis due to calcified coronary lesion: Secondary | ICD-10-CM | POA: Diagnosis not present

## 2022-03-26 DIAGNOSIS — I2510 Atherosclerotic heart disease of native coronary artery without angina pectoris: Secondary | ICD-10-CM | POA: Diagnosis not present

## 2022-03-26 DIAGNOSIS — I351 Nonrheumatic aortic (valve) insufficiency: Secondary | ICD-10-CM | POA: Diagnosis not present

## 2022-03-26 DIAGNOSIS — Z1330 Encounter for screening examination for mental health and behavioral disorders, unspecified: Secondary | ICD-10-CM | POA: Diagnosis not present

## 2022-04-25 DIAGNOSIS — R0981 Nasal congestion: Secondary | ICD-10-CM | POA: Diagnosis not present

## 2022-04-25 DIAGNOSIS — R051 Acute cough: Secondary | ICD-10-CM | POA: Diagnosis not present

## 2022-04-25 DIAGNOSIS — J22 Unspecified acute lower respiratory infection: Secondary | ICD-10-CM | POA: Diagnosis not present

## 2022-06-05 DIAGNOSIS — S91332A Puncture wound without foreign body, left foot, initial encounter: Secondary | ICD-10-CM | POA: Diagnosis not present

## 2022-06-05 DIAGNOSIS — L03116 Cellulitis of left lower limb: Secondary | ICD-10-CM | POA: Diagnosis not present

## 2022-06-05 DIAGNOSIS — Z23 Encounter for immunization: Secondary | ICD-10-CM | POA: Diagnosis not present

## 2022-06-10 DIAGNOSIS — M5136 Other intervertebral disc degeneration, lumbar region: Secondary | ICD-10-CM | POA: Diagnosis not present

## 2022-06-10 DIAGNOSIS — M48062 Spinal stenosis, lumbar region with neurogenic claudication: Secondary | ICD-10-CM | POA: Diagnosis not present

## 2022-06-10 DIAGNOSIS — M5441 Lumbago with sciatica, right side: Secondary | ICD-10-CM | POA: Diagnosis not present

## 2022-06-10 DIAGNOSIS — M5416 Radiculopathy, lumbar region: Secondary | ICD-10-CM | POA: Diagnosis not present

## 2022-06-10 DIAGNOSIS — M5442 Lumbago with sciatica, left side: Secondary | ICD-10-CM | POA: Diagnosis not present

## 2022-06-10 DIAGNOSIS — G8929 Other chronic pain: Secondary | ICD-10-CM | POA: Diagnosis not present

## 2022-06-10 DIAGNOSIS — M47816 Spondylosis without myelopathy or radiculopathy, lumbar region: Secondary | ICD-10-CM | POA: Diagnosis not present

## 2022-06-18 DIAGNOSIS — L255 Unspecified contact dermatitis due to plants, except food: Secondary | ICD-10-CM | POA: Diagnosis not present

## 2022-06-26 ENCOUNTER — Ambulatory Visit: Payer: Medicare Other | Attending: Physician Assistant | Admitting: Physical Therapy

## 2022-06-26 ENCOUNTER — Other Ambulatory Visit: Payer: Self-pay

## 2022-06-26 ENCOUNTER — Encounter: Payer: Self-pay | Admitting: Physical Therapy

## 2022-06-26 DIAGNOSIS — M5459 Other low back pain: Secondary | ICD-10-CM | POA: Diagnosis not present

## 2022-06-26 DIAGNOSIS — R293 Abnormal posture: Secondary | ICD-10-CM | POA: Insufficient documentation

## 2022-06-26 DIAGNOSIS — M62830 Muscle spasm of back: Secondary | ICD-10-CM

## 2022-06-26 NOTE — Therapy (Signed)
OUTPATIENT PHYSICAL THERAPY THORACOLUMBAR EVALUATION   Patient Name: Blake Madden. MRN: [id number] DOB:[birth year], 77 y.o., male Today's Date: 06/26/2022  END OF SESSION:  PT End of Session - 06/26/22 1416     Visit Number 1    Number of Visits 8    Date for PT Re-Evaluation 09/24/22    Authorization Type FOTO AT LEAST EVERY 5TH VISIT.  PROGRESS NOTE AT 10TH VISIT.  KX MODIFIER AFTER 15 VISITS.    PT Start Time 0145    PT Stop Time 0231    PT Time Calculation (min) 46 min    Activity Tolerance Patient tolerated treatment well    Behavior During Therapy WFL for tasks assessed/performed             Past Medical History:  Diagnosis Date   Allergic rhinitis    Allergy    Asthma    BPH (benign prostatic hypertrophy)    Depression    GERD (gastroesophageal reflux disease)    HTN (hypertension)    Hyperlipemia    Migraines    Past Surgical History:  Procedure Laterality Date   INGUINAL HERNIA REPAIR Bilateral 02/07/2016   Procedure: LAPAROSCOPIC BILATERAL INGUINAL HERNIA REPAIR WITH UMBILICIAL HERNIA REPAIR;  Surgeon: Abigail Miyamoto, MD;  Location: [facility];  Service: General;  Laterality: Bilateral;   INSERTION OF MESH Bilateral 02/07/2016   Procedure: INSERTION OF MESH TO INGUINAL HERNIAS;  Surgeon: Abigail Miyamoto, MD;  Location: [facility];  Service: General;  Laterality: Bilateral;   KNEE ARTHROSCOPY Left    Patient Active Problem List   Diagnosis Date Noted   Allergic rhinitis 11/16/2020   Benign neoplasm of colon 11/16/2020   Asbestosis (HCC) 11/16/2020   Benign neoplasm of lip 11/16/2020   Brachial neuritis 11/16/2020   Disorder of skin and subcutaneous tissue 11/16/2020   Major depressive disorder, recurrent episode, mild (HCC) 11/16/2020   Other bursitis disorders 11/16/2020   Stricture and stenosis of esophagus 11/16/2020   Asthma-COPD overlap syndrome 07/19/2019   Controlled substance agreement signed 11/03/2018   Insomnia 04/23/2018   Benign prostatic  hyperplasia    HTN (hypertension)    Hyperlipemia    Asbestos exposure 05/06/2014   Asthma, chronic obstructive, with acute exacerbation (HCC) 07/17/2012   GERD (gastroesophageal reflux disease) 05/08/2012   Depression 05/08/2012     REFERRING PROVIDER: Jillyn Hidden Smart PA-C  REFERRING DIAG: DDD, lumbar.  Rationale for Evaluation and Treatment: Rehabilitation  THERAPY DIAG:  Other low back pain - Plan: PT plan of care cert/re-cert  Abnormal posture - Plan: PT plan of care cert/re-cert  Muscle spasm of back - Plan: PT plan of care cert/re-cert  ONSET DATE: Ongoing.  SUBJECTIVE:  SUBJECTIVE STATEMENT: The patient presents to the clinic with c/o left-sided back pain that will rise to a 7/10 after rising from sitting and becoming active.  The pain is on the left-side.  He had surgery on 12/25/22 involving a decompressive laminectomy from L2 to S1.  He states the suregry was helpful.  He has no pain while seated.  He is very active and spends a lot of time doing yard work and enjoys Systems analyst.  PERTINENT HISTORY:  Lumbar laminectomy, bilateral TKA's.  PAIN:  Are you having pain? No.  No pain while seated.  PRECAUTIONS: None  WEIGHT BEARING RESTRICTIONS: No  FALLS:  Has patient fallen in last 6 months? No  LIVING ENVIRONMENT: Lives in: House/apartment Has following equipment at home: None  OCCUPATION: retired.  PLOF: Independent  PATIENT GOALS: Less pain with activities.  OBJECTIVE:  PATIENT SURVEYS:  FOTO .  POSTURE: rounded shoulders, forward head, decreased lumbar lordosis, and flexed trunk , right lateral trunk shift, right iliac crest higher.  He stands in 20 degrees of trunk flexion.  PALPATION: Very taut with tenderness over left QL.  LUMBAR ROM:  Active lumbar extension to 15  degrees and full active lumbar flexion.  LOWER EXTREMITY ROM:     In supine bilateral hip flexion is WNL.  LOWER EXTREMITY MMT:    Normal LE strength.  GAIT: Normal gait cadence with aforementioned postural deviations.   TODAY'S TREATMENT:                                                                                                                              DATE: HMP and IFC at 80-150 Hz on 40% scan x 20 minutes to patient's left lumbar region.  Patient tolerated treatment without complaint with normal modality response following removal of modality.   ASSESSMENT:  CLINICAL IMPRESSION: The patient presents to [facility] with c/o left-sided low back pain that rise to high levels with increased activity.  He has no pain while seated.  He has multiple postural abnormalities and stands in 20 degrees of flexion.  His left QL is tender and taut to palpation.  He has normal LE strength.  In spite of his pain he leads a very active lifestyle and hopes to accomplish these tasks with less pain.   Patient will benefit from skilled physical therapy intervention to address pain and deficits.  OBJECTIVE IMPAIRMENTS: decreased activity tolerance, decreased ROM, increased muscle spasms, postural dysfunction, and pain.   ACTIVITY LIMITATIONS: carrying, lifting, bending, and standing  PARTICIPATION LIMITATIONS: meal prep, cleaning, laundry, and yard work  PERSONAL FACTORS: Time since onset of injury/illness/exacerbation and 1 comorbidity: previous lumbar surgery  are also affecting patient's functional outcome.   REHAB POTENTIAL: Good  CLINICAL DECISION MAKING: Stable/uncomplicated  EVALUATION COMPLEXITY: Low   GOALS:  SHORT TERM GOALS: Target date: 07/10/22  Ind with a HEP. Goal status: INITIAL  LONG TERM GOALS: Target date: 09/24/22  Perform ADL's with pain not > 3-4/10.  Goal status: INITIAL  2.  Golf with pain not > 3-4/10.  Goal status: INITIAL  PLAN:  PT FREQUENCY:  2x/week  PT DURATION: 4 weeks  PLANNED INTERVENTIONS: Therapeutic exercises, Therapeutic activity, Patient/Family education, Self Care, Dry Needling, Electrical stimulation, Cryotherapy, Moist heat, Ultrasound, and Manual therapy.  PLAN FOR NEXT SESSION: STW/M, combo e'stim/US, core exercise progression, postural improvement, spinal protection techniques and body mechanics    Arena Lindahl, [place], PT 06/26/2022, 2:54 PM

## 2022-07-02 ENCOUNTER — Encounter: Payer: Self-pay | Admitting: Physical Therapy

## 2022-07-02 ENCOUNTER — Ambulatory Visit: Payer: Medicare Other | Attending: Physician Assistant | Admitting: Physical Therapy

## 2022-07-02 DIAGNOSIS — R293 Abnormal posture: Secondary | ICD-10-CM | POA: Diagnosis not present

## 2022-07-02 DIAGNOSIS — M5459 Other low back pain: Secondary | ICD-10-CM | POA: Insufficient documentation

## 2022-07-02 DIAGNOSIS — M5442 Lumbago with sciatica, left side: Secondary | ICD-10-CM | POA: Insufficient documentation

## 2022-07-02 DIAGNOSIS — M62830 Muscle spasm of back: Secondary | ICD-10-CM | POA: Insufficient documentation

## 2022-07-02 NOTE — Therapy (Signed)
OUTPATIENT PHYSICAL THERAPY THORACOLUMBAR EVALUATION   Patient Name: Blake Madden. MRN: [id number] DOB:[birth year], 77 y.o., male Today's Date: 07/02/2022  END OF SESSION:  PT End of Session - 07/02/22 0831     Visit Number 2    Number of Visits 8    Date for PT Re-Evaluation 09/24/22    Authorization Type FOTO AT LEAST EVERY 5TH VISIT.  PROGRESS NOTE AT 10TH VISIT.  KX MODIFIER AFTER 15 VISITS.    PT Start Time 0756    PT Stop Time 0850    PT Time Calculation (min) 54 min    Activity Tolerance Patient tolerated treatment well    Behavior During Therapy WFL for tasks assessed/performed             Past Medical History:  Diagnosis Date   Allergic rhinitis    Allergy    Asthma    BPH (benign prostatic hypertrophy)    Depression    GERD (gastroesophageal reflux disease)    HTN (hypertension)    Hyperlipemia    Migraines    Past Surgical History:  Procedure Laterality Date   INGUINAL HERNIA REPAIR Bilateral 02/07/2016   Procedure: LAPAROSCOPIC BILATERAL INGUINAL HERNIA REPAIR WITH UMBILICIAL HERNIA REPAIR;  Surgeon: Abigail Miyamoto, MD;  Location: [facility];  Service: General;  Laterality: Bilateral;   INSERTION OF MESH Bilateral 02/07/2016   Procedure: INSERTION OF MESH TO INGUINAL HERNIAS;  Surgeon: Abigail Miyamoto, MD;  Location: [facility];  Service: General;  Laterality: Bilateral;   KNEE ARTHROSCOPY Left    Patient Active Problem List   Diagnosis Date Noted   Allergic rhinitis 11/16/2020   Benign neoplasm of colon 11/16/2020   Asbestosis (HCC) 11/16/2020   Benign neoplasm of lip 11/16/2020   Brachial neuritis 11/16/2020   Disorder of skin and subcutaneous tissue 11/16/2020   Major depressive disorder, recurrent episode, mild (HCC) 11/16/2020   Other bursitis disorders 11/16/2020   Stricture and stenosis of esophagus 11/16/2020   Asthma-COPD overlap syndrome 07/19/2019   Controlled substance agreement signed 11/03/2018   Insomnia 04/23/2018   Benign prostatic  hyperplasia    HTN (hypertension)    Hyperlipemia    Asbestos exposure 05/06/2014   Asthma, chronic obstructive, with acute exacerbation (HCC) 07/17/2012   GERD (gastroesophageal reflux disease) 05/08/2012   Depression 05/08/2012     REFERRING PROVIDER: Jillyn Hidden Smart PA-C  REFERRING DIAG: DDD, lumbar.  Rationale for Evaluation and Treatment: Rehabilitation  THERAPY DIAG:  Other low back pain  Abnormal posture  Muscle spasm of back  Acute left-sided low back pain with left-sided sciatica  ONSET DATE: Ongoing.  SUBJECTIVE:  SUBJECTIVE STATEMENT: Not too bad this morning.  PERTINENT HISTORY:  Lumbar laminectomy, bilateral TKA's.  PAIN:  Are you having pain? 2.   PRECAUTIONS: None  WEIGHT BEARING RESTRICTIONS: No  FALLS:  Has patient fallen in last 6 months? No  LIVING ENVIRONMENT: Lives in: House/apartment Has following equipment at home: None  OCCUPATION: retired.  PLOF: Independent  PATIENT GOALS: Less pain with activities.  OBJECTIVE:  PATIENT SURVEYS:  FOTO .  POSTURE: rounded shoulders, forward head, decreased lumbar lordosis, and flexed trunk , right lateral trunk shift, right iliac crest higher.  He stands in 20 degrees of trunk flexion.  PALPATION: Very taut with tenderness over left QL.  LUMBAR ROM:  Active lumbar extension to 15 degrees and full active lumbar flexion.  LOWER EXTREMITY ROM:     In supine bilateral hip flexion is WNL.  LOWER EXTREMITY MMT:    Normal LE strength.  GAIT: Normal gait cadence with aforementioned postural deviations.   TODAY'S TREATMENT:                                                                                                                              DATE: Right sdly position with pillow between knees for comfort:   Combo e'stim/US at 1.50 W/CM2 x 12 minutes f/b STW/M x 11 minutes with ischemic release technique utilized over his left QL f/b HMP and IFC at 80-150 Hz on 40% scan x 20 minutes to patient's left lumbar region.  Patient tolerated treatment without complaint with normal modality response following removal of modality.   ASSESSMENT:  CLINICAL IMPRESSION: Patient with a lowered pain-level this morning.  His left QL region notable for a significant trigger point with good response to STW/M.  OBJECTIVE IMPAIRMENTS: decreased activity tolerance, decreased ROM, increased muscle spasms, postural dysfunction, and pain.   ACTIVITY LIMITATIONS: carrying, lifting, bending, and standing  PARTICIPATION LIMITATIONS: meal prep, cleaning, laundry, and yard work  PERSONAL FACTORS: Time since onset of injury/illness/exacerbation and 1 comorbidity: previous lumbar surgery  are also affecting patient's functional outcome.   REHAB POTENTIAL: Good  CLINICAL DECISION MAKING: Stable/uncomplicated  EVALUATION COMPLEXITY: Low   GOALS:  SHORT TERM GOALS: Target date: 07/10/22  Ind with a HEP. Goal status: INITIAL  LONG TERM GOALS: Target date: 09/24/22  Perform ADL's with pain not > 3-4/10.  Goal status: INITIAL  2.  Golf with pain not > 3-4/10.  Goal status: INITIAL  PLAN:  PT FREQUENCY: 2x/week  PT DURATION: 4 weeks  PLANNED INTERVENTIONS: Therapeutic exercises, Therapeutic activity, Patient/Family education, Self Care, Dry Needling, Electrical stimulation, Cryotherapy, Moist heat, Ultrasound, and Manual therapy.  PLAN FOR NEXT SESSION: STW/M, combo e'stim/US, core exercise progression, postural improvement, spinal protection techniques and body mechanics    Darlen Gledhill, [place], PT 07/02/2022, 9:07 AM

## 2022-07-04 ENCOUNTER — Ambulatory Visit: Payer: Medicare Other | Admitting: *Deleted

## 2022-07-04 ENCOUNTER — Encounter: Payer: Self-pay | Admitting: *Deleted

## 2022-07-04 DIAGNOSIS — R293 Abnormal posture: Secondary | ICD-10-CM | POA: Diagnosis not present

## 2022-07-04 DIAGNOSIS — M62830 Muscle spasm of back: Secondary | ICD-10-CM | POA: Diagnosis not present

## 2022-07-04 DIAGNOSIS — M5442 Lumbago with sciatica, left side: Secondary | ICD-10-CM | POA: Diagnosis not present

## 2022-07-04 DIAGNOSIS — M5459 Other low back pain: Secondary | ICD-10-CM | POA: Diagnosis not present

## 2022-07-04 NOTE — Therapy (Signed)
OUTPATIENT PHYSICAL THERAPY THORACOLUMBAR TREATMENT   Patient Name: Blake Madden. MRN: [id number] DOB:February 09, 1945, 77 y.o., male Today's Date: 07/04/2022  END OF SESSION:  PT End of Session - 07/04/22 0800     Visit Number 3    Number of Visits 8    Date for PT Re-Evaluation 09/24/22    Authorization Type FOTO AT LEAST EVERY 5TH VISIT.  PROGRESS NOTE AT 10TH VISIT.  KX MODIFIER AFTER 15 VISITS.    PT Start Time 0800    PT Stop Time 0850    PT Time Calculation (min) 50 min             Past Medical History:  Diagnosis Date   Allergic rhinitis    Allergy    Asthma    BPH (benign prostatic hypertrophy)    Depression    GERD (gastroesophageal reflux disease)    HTN (hypertension)    Hyperlipemia    Migraines    Past Surgical History:  Procedure Laterality Date   INGUINAL HERNIA REPAIR Bilateral 02/07/2016   Procedure: LAPAROSCOPIC BILATERAL INGUINAL HERNIA REPAIR WITH UMBILICIAL HERNIA REPAIR;  Surgeon: Abigail Miyamoto, MD;  Location: [facility];  Service: General;  Laterality: Bilateral;   INSERTION OF MESH Bilateral 02/07/2016   Procedure: INSERTION OF MESH TO INGUINAL HERNIAS;  Surgeon: Abigail Miyamoto, MD;  Location: [facility];  Service: General;  Laterality: Bilateral;   KNEE ARTHROSCOPY Left    Patient Active Problem List   Diagnosis Date Noted   Allergic rhinitis 11/16/2020   Benign neoplasm of colon 11/16/2020   Asbestosis (HCC) 11/16/2020   Benign neoplasm of lip 11/16/2020   Brachial neuritis 11/16/2020   Disorder of skin and subcutaneous tissue 11/16/2020   Major depressive disorder, recurrent episode, mild (HCC) 11/16/2020   Other bursitis disorders 11/16/2020   Stricture and stenosis of esophagus 11/16/2020   Asthma-COPD overlap syndrome 07/19/2019   Controlled substance agreement signed 11/03/2018   Insomnia 04/23/2018   Benign prostatic hyperplasia    HTN (hypertension)    Hyperlipemia    Asbestos exposure 05/06/2014   Asthma, chronic obstructive, with  acute exacerbation (HCC) 07/17/2012   GERD (gastroesophageal reflux disease) 05/08/2012   Depression 05/08/2012     REFERRING PROVIDER: Jillyn Hidden Smart PA-C  REFERRING DIAG: DDD, lumbar.  Rationale for Evaluation and Treatment: Rehabilitation  THERAPY DIAG:  Other low back pain  Abnormal posture  Muscle spasm of back  ONSET DATE: Ongoing.  SUBJECTIVE:  SUBJECTIVE STATEMENT: Not too bad this morning. 8/10 yesterday after doing yard work.  PERTINENT HISTORY:  Lumbar laminectomy 11-2021 , bilateral TKA's.  PAIN:  Are you having pain? 2.   PRECAUTIONS: None  WEIGHT BEARING RESTRICTIONS: No  FALLS:  Has patient fallen in last 6 months? No  LIVING ENVIRONMENT: Lives in: House/apartment Has following equipment at home: None  OCCUPATION: retired.  PLOF: Independent  PATIENT GOALS: Less pain with activities.  OBJECTIVE:  PATIENT SURVEYS:  FOTO .  POSTURE: rounded shoulders, forward head, decreased lumbar lordosis, and flexed trunk , right lateral trunk shift, right iliac crest higher.  He stands in 20 degrees of trunk flexion.  PALPATION: Very taut with tenderness over left QL.  LUMBAR ROM:  Active lumbar extension to 15 degrees and full active lumbar flexion.  LOWER EXTREMITY ROM:     In supine bilateral hip flexion is WNL.  LOWER EXTREMITY MMT:    Normal LE strength.  GAIT: Normal gait cadence with aforementioned postural deviations.   TODAY'S TREATMENT:                                                                                                                              DATE:                                                          07-04-22 Pt instructed in AB bracing with transitional movements as well as new movement patterns to decrease pain triggers with ADL's. Pt  also reported pain getting in/out of his truck, but was able to perform today with AB bracing and technique change without increased pain. Handout given for AB bracing.Add standing modified bird dog next Rx.  IFC at 80-150 Hz on 40% scan x 15 minutes to patient's left lumbar region.  Patient tolerated treatment without complaint with normal modality response following removal of modality.   ASSESSMENT:  CLINICAL IMPRESSION: Patient with a lowered pain-level this morning, but reports 8/10 LBP yesterday after yard work. Rx focused on teaching Pt AB bracing for transitional movements and new movement patterns to  decrease pain triggers with ADL's. IFC end of session.   OBJECTIVE IMPAIRMENTS: decreased activity tolerance, decreased ROM, increased muscle spasms, postural dysfunction, and pain.   ACTIVITY LIMITATIONS: carrying, lifting, bending, and standing  PARTICIPATION LIMITATIONS: meal prep, cleaning, laundry, and yard work  PERSONAL FACTORS: Time since onset of injury/illness/exacerbation and 1 comorbidity: previous lumbar surgery  are also affecting patient's functional outcome.   REHAB POTENTIAL: Good  CLINICAL DECISION MAKING: Stable/uncomplicated  EVALUATION COMPLEXITY: Low   GOALS:  SHORT TERM GOALS: Target date: 07/10/22  Ind with a HEP. Goal status: INITIAL  LONG TERM GOALS: Target date: 09/24/22  Perform ADL's with pain not > 3-4/10.  Goal status: INITIAL  2.  Golf with pain not > 3-4/10.  Goal status: INITIAL  PLAN:  PT FREQUENCY: 2x/week  PT DURATION: 4 weeks  PLANNED INTERVENTIONS: Therapeutic exercises, Therapeutic activity, Patient/Family education, Self Care, Dry Needling, Electrical stimulation, Cryotherapy, Moist heat, Ultrasound, and Manual therapy.  PLAN FOR NEXT SESSION: STW/M, combo e'stim/US, core exercise progression, postural improvement, spinal protection techniques and body mechanics    Evann Koelzer,CH[place], PTA 07/04/2022, 8:59 AM

## 2022-07-09 ENCOUNTER — Ambulatory Visit: Payer: Medicare Other | Admitting: Physical Therapy

## 2022-07-09 DIAGNOSIS — M5442 Lumbago with sciatica, left side: Secondary | ICD-10-CM

## 2022-07-09 DIAGNOSIS — M62830 Muscle spasm of back: Secondary | ICD-10-CM | POA: Diagnosis not present

## 2022-07-09 DIAGNOSIS — M5459 Other low back pain: Secondary | ICD-10-CM | POA: Diagnosis not present

## 2022-07-09 DIAGNOSIS — R293 Abnormal posture: Secondary | ICD-10-CM | POA: Diagnosis not present

## 2022-07-09 NOTE — Therapy (Signed)
OUTPATIENT PHYSICAL THERAPY THORACOLUMBAR TREATMENT   Patient Name: Blake Madden. MRN: [id number] DOB:[birth year], 77 y.o., male Today's Date: 07/09/2022  END OF SESSION:  PT End of Session - 07/09/22 1719     Visit Number 4    Number of Visits 8    Date for PT Re-Evaluation 09/24/22    Authorization Type FOTO AT LEAST EVERY 5TH VISIT.  PROGRESS NOTE AT 10TH VISIT.  KX MODIFIER AFTER 15 VISITS.    PT Start Time 0440    PT Stop Time 0530    PT Time Calculation (min) 50 min    Activity Tolerance Patient tolerated treatment well    Behavior During Therapy WFL for tasks assessed/performed             Past Medical History:  Diagnosis Date   Allergic rhinitis    Allergy    Asthma    BPH (benign prostatic hypertrophy)    Depression    GERD (gastroesophageal reflux disease)    HTN (hypertension)    Hyperlipemia    Migraines    Past Surgical History:  Procedure Laterality Date   INGUINAL HERNIA REPAIR Bilateral 02/07/2016   Procedure: LAPAROSCOPIC BILATERAL INGUINAL HERNIA REPAIR WITH UMBILICIAL HERNIA REPAIR;  Surgeon: Abigail Miyamoto, MD;  Location: [facility];  Service: General;  Laterality: Bilateral;   INSERTION OF MESH Bilateral 02/07/2016   Procedure: INSERTION OF MESH TO INGUINAL HERNIAS;  Surgeon: Abigail Miyamoto, MD;  Location: [facility];  Service: General;  Laterality: Bilateral;   KNEE ARTHROSCOPY Left    Patient Active Problem List   Diagnosis Date Noted   Allergic rhinitis 11/16/2020   Benign neoplasm of colon 11/16/2020   Asbestosis (HCC) 11/16/2020   Benign neoplasm of lip 11/16/2020   Brachial neuritis 11/16/2020   Disorder of skin and subcutaneous tissue 11/16/2020   Major depressive disorder, recurrent episode, mild (HCC) 11/16/2020   Other bursitis disorders 11/16/2020   Stricture and stenosis of esophagus 11/16/2020   Asthma-COPD overlap syndrome 07/19/2019   Controlled substance agreement signed 11/03/2018   Insomnia 04/23/2018   Benign prostatic  hyperplasia    HTN (hypertension)    Hyperlipemia    Asbestos exposure 05/06/2014   Asthma, chronic obstructive, with acute exacerbation (HCC) 07/17/2012   GERD (gastroesophageal reflux disease) 05/08/2012   Depression 05/08/2012     REFERRING PROVIDER: Jillyn Hidden Smart PA-C  REFERRING DIAG: DDD, lumbar.  Rationale for Evaluation and Treatment: Rehabilitation  THERAPY DIAG:  Other low back pain  Abnormal posture  Muscle spasm of back  Acute left-sided low back pain with left-sided sciatica  ONSET DATE: Ongoing.  SUBJECTIVE:  SUBJECTIVE STATEMENT: Last session helped a lot.  I'm scheduled for Thursday and will plan on making this my last day.  PERTINENT HISTORY:  Lumbar laminectomy 11-2021 , bilateral TKA's.  PAIN:  Are you having pain? 1-2.   PRECAUTIONS: None  WEIGHT BEARING RESTRICTIONS: No  FALLS:  Has patient fallen in last 6 months? No  LIVING ENVIRONMENT: Lives in: House/apartment Has following equipment at home: None  OCCUPATION: retired.  PLOF: Independent  PATIENT GOALS: Less pain with activities.  OBJECTIVE:  PATIENT SURVEYS:  FOTO .  POSTURE: rounded shoulders, forward head, decreased lumbar lordosis, and flexed trunk , right lateral trunk shift, right iliac crest higher.  He stands in 20 degrees of trunk flexion.  PALPATION: Very taut with tenderness over left QL.  LUMBAR ROM:  Active lumbar extension to 15 degrees and full active lumbar flexion.  LOWER EXTREMITY ROM:     In supine bilateral hip flexion is WNL.  LOWER EXTREMITY MMT:    Normal LE strength.  GAIT: Normal gait cadence with aforementioned postural deviations.   TODAY'S TREATMENT:                                                                                                                               DATE:                                                          07-09-22 In supine:  Assisted bilateral SKTC and DKTC in a low load long duration fashion (one minute+ holds) f/b hip bridges to fatigue f/b STW/M x 18 minutes to lumbar musculature f/b  HMP and IFC at 80-150 Hz on 40% scan x 15 minutes.  Normal modality response following removal of modality.   HEP:  HOME EXERCISE PROGRAM Created by [place] Mane Consolo Jun 11th, 2024 View at [email or web address] using code: [email or web address]  Page 1 of 1 3 Exercises SINGLE KNEE TO CHEST STRETCH - SKTC While Lying on your back, hold your knee and gently pull it up towards your chest. Repeat 3 Times Hold 30 Seconds Complete 1 Set Perform 3 Times a Day Double Knee to chest stretch Pull your knees up to your chest hold for 30 seconds then relax. Repeat Continue to breathe throughout stretch. Repeat 2 Times Hold 30 Seconds Complete 3 Sets Perform 2 Times a Day Bridges While laying on your back, contract the low abdominals and lift from the hips.   ASSESSMENT:  CLINICAL IMPRESSION: Patient very pleased with his progress and presented to the clinic with a low pain-level.  He feels he is ready for discharge with his last planned treatment on 07/11/22.  We discussed advancing core exercises for his HEP.  Great response to treatment today and pain-free following.  Performed therex with excellent technique and  no complaints.  Handouts provided. OBJECTIVE IMPAIRMENTS: decreased activity tolerance, decreased ROM, increased muscle spasms, postural dysfunction, and pain.   ACTIVITY LIMITATIONS: carrying, lifting, bending, and standing  PARTICIPATION LIMITATIONS: meal prep, cleaning, laundry, and yard work  PERSONAL FACTORS: Time since onset of injury/illness/exacerbation and 1 comorbidity: previous lumbar surgery  are also affecting patient's functional outcome.   REHAB POTENTIAL: Good  CLINICAL DECISION MAKING:  Stable/uncomplicated  EVALUATION COMPLEXITY: Low   GOALS:  SHORT TERM GOALS: Target date: 07/10/22  Ind with a HEP. Goal status: INITIAL  LONG TERM GOALS: Target date: 09/24/22  Perform ADL's with pain not > 3-4/10.  Goal status: INITIAL  2.  Golf with pain not > 3-4/10.  Goal status: INITIAL  PLAN:  PT FREQUENCY: 2x/week  PT DURATION: 4 weeks  PLANNED INTERVENTIONS: Therapeutic exercises, Therapeutic activity, Patient/Family education, Self Care, Dry Needling, Electrical stimulation, Cryotherapy, Moist heat, Ultrasound, and Manual therapy.  PLAN FOR NEXT SESSION: Advance core exercises.  FOTO.   Infant Doane, [place], PT 07/09/2022, 5:33 PM

## 2022-07-11 ENCOUNTER — Ambulatory Visit: Payer: Medicare Other

## 2022-07-11 DIAGNOSIS — M62830 Muscle spasm of back: Secondary | ICD-10-CM | POA: Diagnosis not present

## 2022-07-11 DIAGNOSIS — R293 Abnormal posture: Secondary | ICD-10-CM | POA: Diagnosis not present

## 2022-07-11 DIAGNOSIS — M5442 Lumbago with sciatica, left side: Secondary | ICD-10-CM | POA: Diagnosis not present

## 2022-07-11 DIAGNOSIS — M5459 Other low back pain: Secondary | ICD-10-CM | POA: Diagnosis not present

## 2022-07-11 NOTE — Therapy (Signed)
OUTPATIENT PHYSICAL THERAPY THORACOLUMBAR TREATMENT   Patient Name: Blake Madden. MRN: [id number] DOB:1945/05/11, 77 y.o., male Today's Date: 07/11/2022  END OF SESSION:  PT End of Session - 07/11/22 1606     Visit Number 5    Number of Visits 8    Date for PT Re-Evaluation 09/24/22    Authorization Type FOTO AT LEAST EVERY 5TH VISIT.  PROGRESS NOTE AT 10TH VISIT.  KX MODIFIER AFTER 15 VISITS.    PT Start Time 1600    PT Stop Time 1610    PT Time Calculation (min) 10 min    Activity Tolerance Patient tolerated treatment well    Behavior During Therapy WFL for tasks assessed/performed             Past Medical History:  Diagnosis Date   Allergic rhinitis    Allergy    Asthma    BPH (benign prostatic hypertrophy)    Depression    GERD (gastroesophageal reflux disease)    HTN (hypertension)    Hyperlipemia    Migraines    Past Surgical History:  Procedure Laterality Date   INGUINAL HERNIA REPAIR Bilateral 02/07/2016   Procedure: LAPAROSCOPIC BILATERAL INGUINAL HERNIA REPAIR WITH UMBILICIAL HERNIA REPAIR;  Surgeon: Abigail Miyamoto, MD;  Location: [facility] OR;  Service: General;  Laterality: Bilateral;   INSERTION OF MESH Bilateral 02/07/2016   Procedure: INSERTION OF MESH TO INGUINAL HERNIAS;  Surgeon: Abigail Miyamoto, MD;  Location: [facility] OR;  Service: General;  Laterality: Bilateral;   KNEE ARTHROSCOPY Left    Patient Active Problem List   Diagnosis Date Noted   Allergic rhinitis 11/16/2020   Benign neoplasm of colon 11/16/2020   Asbestosis (HCC) 11/16/2020   Benign neoplasm of lip 11/16/2020   Brachial neuritis 11/16/2020   Disorder of skin and subcutaneous tissue 11/16/2020   Major depressive disorder, recurrent episode, mild (HCC) 11/16/2020   Other bursitis disorders 11/16/2020   Stricture and stenosis of esophagus 11/16/2020   Asthma-COPD overlap syndrome 07/19/2019   Controlled substance agreement signed 11/03/2018   Insomnia 04/23/2018   Benign prostatic  hyperplasia    HTN (hypertension)    Hyperlipemia    Asbestos exposure 05/06/2014   Asthma, chronic obstructive, with acute exacerbation (HCC) 07/17/2012   GERD (gastroesophageal reflux disease) 05/08/2012   Depression 05/08/2012     REFERRING PROVIDER: Jillyn Hidden Smart PA-C  REFERRING DIAG: DDD, lumbar.  Rationale for Evaluation and Treatment: Rehabilitation  THERAPY DIAG:  Other low back pain  Abnormal posture  Muscle spasm of back  ONSET DATE: Ongoing.  SUBJECTIVE:  SUBJECTIVE STATEMENT: Patient reports that he feels good today and feels comfortable with today being his last visit.   PERTINENT HISTORY:  Lumbar laminectomy 11-2021 , bilateral TKA's.  PAIN:  Are you having pain? 0/10.   PRECAUTIONS: None  WEIGHT BEARING RESTRICTIONS: No  FALLS:  Has patient fallen in last 6 months? No  LIVING ENVIRONMENT: Lives in: House/apartment Has following equipment at home: None  OCCUPATION: retired.  PLOF: Independent  PATIENT GOALS: Less pain with activities.  OBJECTIVE:  PATIENT SURVEYS:  FOTO 94.05 on 07/11/22  POSTURE: rounded shoulders, forward head, decreased lumbar lordosis, and flexed trunk , right lateral trunk shift, right iliac crest higher.  He stands in 20 degrees of trunk flexion.  PALPATION: Very taut with tenderness over left QL.  LUMBAR ROM:  Active lumbar extension to 15 degrees and full active lumbar flexion.  LOWER EXTREMITY ROM:     In supine bilateral hip flexion is WNL.  LOWER EXTREMITY MMT:    Normal LE strength.  GAIT: Normal gait cadence with aforementioned postural deviations.   TODAY'S TREATMENT:                                                                                                                              DATE:                                     07/11/22  EXERCISE LOG Patient requested to not perform any exercise today as he would like to be discharged at this time.    HEP:  HOME EXERCISE PROGRAM Created by [place] Applegate Jun 11th, 2024 View at [email or web address] using code: [email or web address]  Page 1 of 1 3 Exercises SINGLE KNEE TO CHEST STRETCH - SKTC While Lying on your back, hold your knee and gently pull it up towards your chest. Repeat 3 Times Hold 30 Seconds Complete 1 Set Perform 3 Times a Day Double Knee to chest stretch Pull your knees up to your chest hold for 30 seconds then relax. Repeat Continue to breathe throughout stretch. Repeat 2 Times Hold 30 Seconds Complete 3 Sets Perform 2 Times a Day Bridges While laying on your back, contract the low abdominals and lift from the hips.   ASSESSMENT:  CLINICAL IMPRESSION: Patient presented to treatment requesting to be discharged at this time as he is pleased with his functional mobility. He was able to meet most of his goals for skilled physical therapy, but he continues to experience intermittent bouts of elevated pain with functional activities. His HEP was reviewed and he felt comfortable with these interventions. He was offered an updated HEP, but he declined and felt comfortable being discharged at this time.   PHYSICAL THERAPY DISCHARGE SUMMARY  Visits from Start of Care: 5  Current functional level related to goals / functional outcomes: Patient has met most of his goals for skilled physical therapy. He  continues to experienced intermittent bouts of elevated pain, but he feels comfortable being discharged at this time.    Remaining deficits: Pain    Education / Equipment: HEP    Patient agrees to discharge. Patient goals were partially met. Patient is being discharged due to being pleased with the current functional level.   OBJECTIVE IMPAIRMENTS: decreased activity tolerance, decreased ROM, increased muscle spasms, postural dysfunction, and  pain.   ACTIVITY LIMITATIONS: carrying, lifting, bending, and standing  PARTICIPATION LIMITATIONS: meal prep, cleaning, laundry, and yard work  PERSONAL FACTORS: Time since onset of injury/illness/exacerbation and 1 comorbidity: previous lumbar surgery  are also affecting patient's functional outcome.   REHAB POTENTIAL: Good  CLINICAL DECISION MAKING: Stable/uncomplicated  EVALUATION COMPLEXITY: Low   GOALS:  SHORT TERM GOALS: Target date: 07/10/22  Ind with a HEP. "I have been doing my HEP every night."  Goal status: MET  LONG TERM GOALS: Target date: 09/24/22  Perform ADL's with pain not > 3-4/10.  "It gets up to a 5-6/10" Goal status: NOT MET  2.  Golf with pain not > 3-4/10.   "I was a little sore after."  Goal status: MET  PLAN:  PT FREQUENCY: 2x/week  PT DURATION: 4 weeks  PLANNED INTERVENTIONS: Therapeutic exercises, Therapeutic activity, Patient/Family education, Self Care, Dry Needling, Electrical stimulation, Cryotherapy, Moist heat, Ultrasound, and Manual therapy.  PLAN FOR NEXT SESSION: Advance core exercises.  FOTO.   Granville Lewis, PT 07/11/2022, 4:28 PM

## 2022-08-11 ENCOUNTER — Other Ambulatory Visit: Payer: Self-pay | Admitting: Family

## 2022-08-11 DIAGNOSIS — K2100 Gastro-esophageal reflux disease with esophagitis, without bleeding: Secondary | ICD-10-CM

## 2022-08-26 DIAGNOSIS — D235 Other benign neoplasm of skin of trunk: Secondary | ICD-10-CM | POA: Diagnosis not present

## 2022-08-26 DIAGNOSIS — D225 Melanocytic nevi of trunk: Secondary | ICD-10-CM | POA: Diagnosis not present

## 2022-08-26 DIAGNOSIS — L579 Skin changes due to chronic exposure to nonionizing radiation, unspecified: Secondary | ICD-10-CM | POA: Diagnosis not present

## 2022-08-26 DIAGNOSIS — L821 Other seborrheic keratosis: Secondary | ICD-10-CM | POA: Diagnosis not present

## 2022-08-26 DIAGNOSIS — Z85828 Personal history of other malignant neoplasm of skin: Secondary | ICD-10-CM | POA: Diagnosis not present

## 2022-08-26 DIAGNOSIS — L570 Actinic keratosis: Secondary | ICD-10-CM | POA: Diagnosis not present

## 2022-08-26 DIAGNOSIS — L814 Other melanin hyperpigmentation: Secondary | ICD-10-CM | POA: Diagnosis not present

## 2022-10-07 DIAGNOSIS — Z85828 Personal history of other malignant neoplasm of skin: Secondary | ICD-10-CM | POA: Diagnosis not present

## 2022-10-07 DIAGNOSIS — L905 Scar conditions and fibrosis of skin: Secondary | ICD-10-CM | POA: Diagnosis not present

## 2022-10-07 DIAGNOSIS — L821 Other seborrheic keratosis: Secondary | ICD-10-CM | POA: Diagnosis not present

## 2022-10-07 DIAGNOSIS — L820 Inflamed seborrheic keratosis: Secondary | ICD-10-CM | POA: Diagnosis not present

## 2022-12-23 DIAGNOSIS — R200 Anesthesia of skin: Secondary | ICD-10-CM | POA: Diagnosis not present

## 2022-12-23 DIAGNOSIS — J4489 Other specified chronic obstructive pulmonary disease: Secondary | ICD-10-CM | POA: Diagnosis not present

## 2022-12-23 DIAGNOSIS — R29898 Other symptoms and signs involving the musculoskeletal system: Secondary | ICD-10-CM | POA: Diagnosis not present

## 2022-12-23 DIAGNOSIS — R194 Change in bowel habit: Secondary | ICD-10-CM | POA: Diagnosis not present

## 2022-12-23 DIAGNOSIS — G4700 Insomnia, unspecified: Secondary | ICD-10-CM | POA: Diagnosis not present

## 2022-12-23 DIAGNOSIS — I10 Essential (primary) hypertension: Secondary | ICD-10-CM | POA: Diagnosis not present

## 2022-12-23 DIAGNOSIS — Z1211 Encounter for screening for malignant neoplasm of colon: Secondary | ICD-10-CM | POA: Diagnosis not present

## 2022-12-23 DIAGNOSIS — Z800 Family history of malignant neoplasm of digestive organs: Secondary | ICD-10-CM | POA: Diagnosis not present

## 2022-12-23 DIAGNOSIS — Z23 Encounter for immunization: Secondary | ICD-10-CM | POA: Diagnosis not present

## 2022-12-30 DIAGNOSIS — H43813 Vitreous degeneration, bilateral: Secondary | ICD-10-CM | POA: Diagnosis not present

## 2022-12-30 DIAGNOSIS — H2513 Age-related nuclear cataract, bilateral: Secondary | ICD-10-CM | POA: Diagnosis not present

## 2023-01-06 DIAGNOSIS — L814 Other melanin hyperpigmentation: Secondary | ICD-10-CM | POA: Diagnosis not present

## 2023-01-06 DIAGNOSIS — D235 Other benign neoplasm of skin of trunk: Secondary | ICD-10-CM | POA: Diagnosis not present

## 2023-01-06 DIAGNOSIS — D225 Melanocytic nevi of trunk: Secondary | ICD-10-CM | POA: Diagnosis not present

## 2023-01-06 DIAGNOSIS — L821 Other seborrheic keratosis: Secondary | ICD-10-CM | POA: Diagnosis not present

## 2023-01-06 DIAGNOSIS — L570 Actinic keratosis: Secondary | ICD-10-CM | POA: Diagnosis not present

## 2023-01-06 DIAGNOSIS — L579 Skin changes due to chronic exposure to nonionizing radiation, unspecified: Secondary | ICD-10-CM | POA: Diagnosis not present

## 2023-01-06 DIAGNOSIS — Z85828 Personal history of other malignant neoplasm of skin: Secondary | ICD-10-CM | POA: Diagnosis not present

## 2023-01-10 DIAGNOSIS — B9689 Other specified bacterial agents as the cause of diseases classified elsewhere: Secondary | ICD-10-CM | POA: Diagnosis not present

## 2023-01-10 DIAGNOSIS — J208 Acute bronchitis due to other specified organisms: Secondary | ICD-10-CM | POA: Diagnosis not present

## 2023-01-10 DIAGNOSIS — R062 Wheezing: Secondary | ICD-10-CM | POA: Diagnosis not present

## 2023-01-24 DIAGNOSIS — H43813 Vitreous degeneration, bilateral: Secondary | ICD-10-CM | POA: Diagnosis not present

## 2023-01-24 DIAGNOSIS — H2513 Age-related nuclear cataract, bilateral: Secondary | ICD-10-CM | POA: Diagnosis not present

## 2023-03-06 ENCOUNTER — Other Ambulatory Visit: Payer: Self-pay | Admitting: Family

## 2023-03-06 DIAGNOSIS — K2100 Gastro-esophageal reflux disease with esophagitis, without bleeding: Secondary | ICD-10-CM
